# Patient Record
Sex: Female | Born: 1950
Health system: Southern US, Community
[De-identification: ages and names within clinical notes are randomized; demographics above are authoritative.]

## PROBLEM LIST (undated history)

## (undated) DIAGNOSIS — M1990 Unspecified osteoarthritis, unspecified site: Secondary | ICD-10-CM

## (undated) DIAGNOSIS — H269 Unspecified cataract: Secondary | ICD-10-CM

## (undated) DIAGNOSIS — C569 Malignant neoplasm of unspecified ovary: Secondary | ICD-10-CM

## (undated) DIAGNOSIS — N189 Chronic kidney disease, unspecified: Secondary | ICD-10-CM

## (undated) DIAGNOSIS — D649 Anemia, unspecified: Secondary | ICD-10-CM

## (undated) DIAGNOSIS — B019 Varicella without complication: Secondary | ICD-10-CM

## (undated) DIAGNOSIS — F32A Depression, unspecified: Secondary | ICD-10-CM

## (undated) DIAGNOSIS — K649 Unspecified hemorrhoids: Secondary | ICD-10-CM

## (undated) DIAGNOSIS — K635 Polyp of colon: Secondary | ICD-10-CM

## (undated) DIAGNOSIS — J45909 Unspecified asthma, uncomplicated: Secondary | ICD-10-CM

## (undated) DIAGNOSIS — F329 Major depressive disorder, single episode, unspecified: Secondary | ICD-10-CM

## (undated) HISTORY — DX: Chronic kidney disease, unspecified: N18.9

## (undated) HISTORY — DX: Polyp of colon: K63.5

## (undated) HISTORY — PX: BREAST CYST ASPIRATION: SHX578

## (undated) HISTORY — DX: Unspecified osteoarthritis, unspecified site: M19.90

## (undated) HISTORY — DX: Unspecified asthma, uncomplicated: J45.909

## (undated) HISTORY — DX: Depression, unspecified: F32.A

## (undated) HISTORY — DX: Varicella without complication: B01.9

## (undated) HISTORY — DX: Unspecified hemorrhoids: K64.9

## (undated) HISTORY — PX: TONSILLECTOMY: SUR1361

## (undated) HISTORY — DX: Unspecified cataract: H26.9

## (undated) HISTORY — DX: Malignant neoplasm of unspecified ovary: C56.9

## (undated) HISTORY — DX: Anemia, unspecified: D64.9

## (undated) HISTORY — PX: REFRACTIVE SURGERY: SHX103

## (undated) HISTORY — DX: Major depressive disorder, single episode, unspecified: F32.9

---

## 2004-04-19 ENCOUNTER — Ambulatory Visit: Payer: Self-pay | Admitting: Unknown Physician Specialty

## 2005-04-30 HISTORY — PX: BREAST SURGERY: SHX581

## 2005-06-05 ENCOUNTER — Ambulatory Visit: Payer: Self-pay | Admitting: Unknown Physician Specialty

## 2005-06-22 ENCOUNTER — Ambulatory Visit: Payer: Self-pay | Admitting: Unknown Physician Specialty

## 2006-06-12 ENCOUNTER — Ambulatory Visit: Payer: Self-pay | Admitting: Unknown Physician Specialty

## 2006-09-27 ENCOUNTER — Ambulatory Visit: Payer: Self-pay | Admitting: Unknown Physician Specialty

## 2007-03-19 ENCOUNTER — Ambulatory Visit: Payer: Self-pay | Admitting: Unknown Physician Specialty

## 2007-06-18 ENCOUNTER — Ambulatory Visit: Payer: Self-pay | Admitting: Unknown Physician Specialty

## 2008-04-30 HISTORY — PX: CARPAL TUNNEL RELEASE: SHX101

## 2008-06-23 ENCOUNTER — Ambulatory Visit: Payer: Self-pay | Admitting: Unknown Physician Specialty

## 2008-06-25 ENCOUNTER — Ambulatory Visit: Payer: Self-pay | Admitting: Internal Medicine

## 2009-06-27 ENCOUNTER — Ambulatory Visit: Payer: Self-pay | Admitting: Unknown Physician Specialty

## 2010-04-30 DIAGNOSIS — K635 Polyp of colon: Secondary | ICD-10-CM

## 2010-04-30 HISTORY — DX: Polyp of colon: K63.5

## 2010-06-28 ENCOUNTER — Ambulatory Visit: Payer: Self-pay | Admitting: Unknown Physician Specialty

## 2011-06-29 ENCOUNTER — Ambulatory Visit: Payer: Self-pay | Admitting: Unknown Physician Specialty

## 2013-07-06 ENCOUNTER — Ambulatory Visit: Payer: Self-pay | Admitting: General Practice

## 2015-03-08 LAB — CBC AND DIFFERENTIAL
HCT: 43 % (ref 36–46)
HEMOGLOBIN: 13.8 g/dL (ref 12.0–16.0)
NEUTROS ABS: 4 /uL
PLATELETS: 256 10*3/uL (ref 150–399)
WBC: 6.1 10^3/mL

## 2015-03-08 LAB — LIPID PANEL
CHOLESTEROL: 233 mg/dL — AB (ref 0–200)
HDL: 102 mg/dL — AB (ref 35–70)
LDL Cholesterol: 121 mg/dL
TRIGLYCERIDES: 52 mg/dL (ref 40–160)

## 2015-03-08 LAB — BASIC METABOLIC PANEL
BUN: 27 mg/dL — AB (ref 4–21)
CREATININE: 1.2 mg/dL — AB (ref 0.5–1.1)
Glucose: 93 mg/dL
Potassium: 4.7 mmol/L (ref 3.4–5.3)
SODIUM: 139 mmol/L (ref 137–147)

## 2015-03-08 LAB — HEPATIC FUNCTION PANEL
ALK PHOS: 85 U/L (ref 25–125)
ALT: 14 U/L (ref 7–35)
AST: 21 U/L (ref 13–35)
BILIRUBIN, TOTAL: 0.4 mg/dL

## 2015-03-08 LAB — TSH: TSH: 5.4 u[IU]/mL (ref 0.41–5.90)

## 2015-11-14 DIAGNOSIS — H43811 Vitreous degeneration, right eye: Secondary | ICD-10-CM | POA: Diagnosis not present

## 2015-11-23 ENCOUNTER — Encounter: Payer: Self-pay | Admitting: Family Medicine

## 2015-11-23 ENCOUNTER — Ambulatory Visit (INDEPENDENT_AMBULATORY_CARE_PROVIDER_SITE_OTHER): Payer: Medicare Other | Admitting: Family Medicine

## 2015-11-23 VITALS — BP 132/82 | HR 84 | Temp 97.9°F | Ht 62.0 in | Wt 168.0 lb

## 2015-11-23 DIAGNOSIS — Z0000 Encounter for general adult medical examination without abnormal findings: Secondary | ICD-10-CM

## 2015-11-23 DIAGNOSIS — F329 Major depressive disorder, single episode, unspecified: Secondary | ICD-10-CM

## 2015-11-23 DIAGNOSIS — Z23 Encounter for immunization: Secondary | ICD-10-CM | POA: Diagnosis not present

## 2015-11-23 DIAGNOSIS — E785 Hyperlipidemia, unspecified: Secondary | ICD-10-CM | POA: Diagnosis not present

## 2015-11-23 DIAGNOSIS — R7989 Other specified abnormal findings of blood chemistry: Secondary | ICD-10-CM

## 2015-11-23 DIAGNOSIS — N183 Chronic kidney disease, stage 3 unspecified: Secondary | ICD-10-CM | POA: Insufficient documentation

## 2015-11-23 DIAGNOSIS — F32A Depression, unspecified: Secondary | ICD-10-CM | POA: Insufficient documentation

## 2015-11-23 DIAGNOSIS — R748 Abnormal levels of other serum enzymes: Secondary | ICD-10-CM

## 2015-11-23 MED ORDER — CITALOPRAM HYDROBROMIDE 20 MG PO TABS: 10.0000 mg | ORAL_TABLET | ORAL | 3 refills | Status: DC

## 2015-11-23 NOTE — Patient Instructions (Addendum)
Follow up annually.  Health Maintenance, Female Adopting a healthy lifestyle and getting preventive care can go a long way to promote health and wellness. Talk with your health care provider about what schedule of regular examinations is right for you. This is a good chance for you to check in with your provider about disease prevention and staying healthy. In between checkups, there are plenty of things you can do on your own. Experts have done a lot of research about which lifestyle changes and preventive measures are most likely to keep you healthy. Ask your health care provider for more information. WEIGHT AND DIET  Eat a healthy diet  Be sure to include plenty of vegetables, fruits, low-fat dairy products, and lean protein.  Do not eat a lot of foods high in solid fats, added sugars, or salt.  Get regular exercise. This is one of the most important things you can do for your health.  Most adults should exercise for at least 150 minutes each week. The exercise should increase your heart rate and make you sweat (moderate-intensity exercise).  Most adults should also do strengthening exercises at least twice a week. This is in addition to the moderate-intensity exercise.  Maintain a healthy weight  Body mass index (BMI) is a measurement that can be used to identify possible weight problems. It estimates body fat based on height and weight. Your health care provider can help determine your BMI and help you achieve or maintain a healthy weight.  For females 20 years of age and older:   A BMI below 18.5 is considered underweight.  A BMI of 18.5 to 24.9 is normal.  A BMI of 25 to 29.9 is considered overweight.  A BMI of 30 and above is considered obese.  Watch levels of cholesterol and blood lipids  You should start having your blood tested for lipids and cholesterol at 65 years of age, then have this test every 5 years.  You may need to have your cholesterol levels checked more  often if:  Your lipid or cholesterol levels are high.  You are older than 65 years of age.  You are at high risk for heart disease.  CANCER SCREENING   Lung Cancer  Lung cancer screening is recommended for adults 55-80 years old who are at high risk for lung cancer because of a history of smoking.  A yearly low-dose CT scan of the lungs is recommended for people who:  Currently smoke.  Have quit within the past 15 years.  Have at least a 30-pack-year history of smoking. A pack year is smoking an average of one pack of cigarettes a day for 1 year.  Yearly screening should continue until it has been 15 years since you quit.  Yearly screening should stop if you develop a health problem that would prevent you from having lung cancer treatment.  Breast Cancer  Practice breast self-awareness. This means understanding how your breasts normally appear and feel.  It also means doing regular breast self-exams. Let your health care provider know about any changes, no matter how small.  If you are in your 20s or 30s, you should have a clinical breast exam (CBE) by a health care provider every 1-3 years as part of a regular health exam.  If you are 40 or older, have a CBE every year. Also consider having a breast X-ray (mammogram) every year.  If you have a family history of breast cancer, talk to your health care provider about genetic   screening.  If you are at high risk for breast cancer, talk to your health care provider about having an MRI and a mammogram every year.  Breast cancer gene (BRCA) assessment is recommended for women who have family members with BRCA-related cancers. BRCA-related cancers include:  Breast.  Ovarian.  Tubal.  Peritoneal cancers.  Results of the assessment will determine the need for genetic counseling and BRCA1 and BRCA2 testing. Cervical Cancer Your health care provider may recommend that you be screened regularly for cancer of the pelvic organs  (ovaries, uterus, and vagina). This screening involves a pelvic examination, including checking for microscopic changes to the surface of your cervix (Pap test). You may be encouraged to have this screening done every 3 years, beginning at age 21.  For women ages 30-65, health care providers may recommend pelvic exams and Pap testing every 3 years, or they may recommend the Pap and pelvic exam, combined with testing for human papilloma virus (HPV), every 5 years. Some types of HPV increase your risk of cervical cancer. Testing for HPV may also be done on women of any age with unclear Pap test results.  Other health care providers may not recommend any screening for nonpregnant women who are considered low risk for pelvic cancer and who do not have symptoms. Ask your health care provider if a screening pelvic exam is right for you.  If you have had past treatment for cervical cancer or a condition that could lead to cancer, you need Pap tests and screening for cancer for at least 20 years after your treatment. If Pap tests have been discontinued, your risk factors (such as having a new sexual partner) need to be reassessed to determine if screening should resume. Some women have medical problems that increase the chance of getting cervical cancer. In these cases, your health care provider may recommend more frequent screening and Pap tests. Colorectal Cancer  This type of cancer can be detected and often prevented.  Routine colorectal cancer screening usually begins at 65 years of age and continues through 65 years of age.  Your health care provider may recommend screening at an earlier age if you have risk factors for colon cancer.  Your health care provider may also recommend using home test kits to check for hidden blood in the stool.  A small camera at the end of a tube can be used to examine your colon directly (sigmoidoscopy or colonoscopy). This is done to check for the earliest forms of  colorectal cancer.  Routine screening usually begins at age 50.  Direct examination of the colon should be repeated every 5-10 years through 65 years of age. However, you may need to be screened more often if early forms of precancerous polyps or small growths are found. Skin Cancer  Check your skin from head to toe regularly.  Tell your health care provider about any new moles or changes in moles, especially if there is a change in a mole's shape or color.  Also tell your health care provider if you have a mole that is larger than the size of a pencil eraser.  Always use sunscreen. Apply sunscreen liberally and repeatedly throughout the day.  Protect yourself by wearing long sleeves, pants, a wide-brimmed hat, and sunglasses whenever you are outside. HEART DISEASE, DIABETES, AND HIGH BLOOD PRESSURE   High blood pressure causes heart disease and increases the risk of stroke. High blood pressure is more likely to develop in:  People who have blood pressure   in the high end of the normal range (130-139/85-89 mm Hg).  People who are overweight or obese.  People who are African American.  If you are 13-33 years of age, have your blood pressure checked every 3-5 years. If you are 45 years of age or older, have your blood pressure checked every year. You should have your blood pressure measured twice--once when you are at a hospital or clinic, and once when you are not at a hospital or clinic. Record the average of the two measurements. To check your blood pressure when you are not at a hospital or clinic, you can use:  An automated blood pressure machine at a pharmacy.  A home blood pressure monitor.  If you are between 23 years and 90 years old, ask your health care provider if you should take aspirin to prevent strokes.  Have regular diabetes screenings. This involves taking a blood sample to check your fasting blood sugar level.  If you are at a normal weight and have a low risk for  diabetes, have this test once every three years after 65 years of age.  If you are overweight and have a high risk for diabetes, consider being tested at a younger age or more often. PREVENTING INFECTION  Hepatitis B  If you have a higher risk for hepatitis B, you should be screened for this virus. You are considered at high risk for hepatitis B if:  You were born in a country where hepatitis B is common. Ask your health care provider which countries are considered high risk.  Your parents were born in a high-risk country, and you have not been immunized against hepatitis B (hepatitis B vaccine).  You have HIV or AIDS.  You use needles to inject street drugs.  You live with someone who has hepatitis B.  You have had sex with someone who has hepatitis B.  You get hemodialysis treatment.  You take certain medicines for conditions, including cancer, organ transplantation, and autoimmune conditions. Hepatitis C  Blood testing is recommended for:  Everyone born from [birth year] through 1965.  Anyone with known risk factors for hepatitis C. Sexually transmitted infections (STIs)  You should be screened for sexually transmitted infections (STIs) including gonorrhea and chlamydia if:  You are sexually active and are younger than 65 years of age.  You are older than 65 years of age and your health care provider tells you that you are at risk for this type of infection.  Your sexual activity has changed since you were last screened and you are at an increased risk for chlamydia or gonorrhea. Ask your health care provider if you are at risk.  If you do not have HIV, but are at risk, it may be recommended that you take a prescription medicine daily to prevent HIV infection. This is called pre-exposure prophylaxis (PrEP). You are considered at risk if:  You are sexually active and do not regularly use condoms or know the HIV status of your partner(s).  You take drugs by injection.  You are  sexually active with a partner who has HIV. Talk with your health care provider about whether you are at high risk of being infected with HIV. If you choose to begin PrEP, you should first be tested for HIV. You should then be tested every 3 months for as long as you are taking PrEP.  PREGNANCY   If you are premenopausal and you may become pregnant, ask your health care provider about preconception counseling.  If you may become pregnant, take 400 to 800 micrograms (mcg) of folic acid every day.  If you want to prevent pregnancy, talk to your health care provider about birth control (contraception). OSTEOPOROSIS AND MENOPAUSE   Osteoporosis is a disease in which the bones lose minerals and strength with aging. This can result in serious bone fractures. Your risk for osteoporosis can be identified using a bone density scan.  If you are 67 years of age or older, or if you are at risk for osteoporosis and fractures, ask your health care provider if you should be screened.  Ask your health care provider whether you should take a calcium or vitamin D supplement to lower your risk for osteoporosis.  Menopause may have certain physical symptoms and risks.  Hormone replacement therapy may reduce some of these symptoms and risks. Talk to your health care provider about whether hormone replacement therapy is right for you.  HOME CARE INSTRUCTIONS   Schedule regular health, dental, and eye exams.  Stay current with your immunizations.   Do not use any tobacco products including cigarettes, chewing tobacco, or electronic cigarettes.  If you are pregnant, do not drink alcohol.  If you are breastfeeding, limit how much and how often you drink alcohol.  Limit alcohol intake to no more than 1 drink per day for nonpregnant women. One drink equals 12 ounces of beer, 5 ounces of wine, or 1 ounces of hard liquor.  Do not use street drugs.  Do not share needles.  Ask your health care provider for  help if you need support or information about quitting drugs.  Tell your health care provider if you often feel depressed.  Tell your health care provider if you have ever been abused or do not feel safe at home.   This information is not intended to replace advice given to you by your health care provider. Make sure you discuss any questions you have with your health care provider.   Document Released: 10/30/2010 Document Revised: 05/07/2014 Document Reviewed: 03/18/2013 Elsevier Interactive Patient Education [employer].

## 2015-11-23 NOTE — Progress Notes (Signed)
Pre visit review using our clinic review tool, if applicable. No additional management support is needed unless otherwise documented below in the visit note. 

## 2015-11-23 NOTE — Progress Notes (Signed)
Subjective:    Beth Hart is a 65 y.o. female who presents to establish care. This is her welcome to Medicare visit.  Current issues/concerns:  Depression  Stable on Celexa.  HLD  ASCVD risk score 5% (perhaps slightly less as her HDL exceeded the risk calculator limit).  Not on treatment at this time.  Elevated creatinine  Has been stable.  Attributed to NSAID use.  Objective:    Today's Vitals   11/23/15 0823  BP: 132/82  Pulse: 84  Temp: 97.9 F (36.6 C)  TempSrc: Oral  SpO2: 95%  Weight: 168 lb (76.2 kg)  Height: 5\' 2"  (1.575 m)  Body mass index is 30.73 kg/m.  Medications Outpatient Encounter Prescriptions as of 11/23/2015  Medication Sig  . CALCIUM-VITAMINS C & D PO Take by mouth.  . citalopram (CELEXA) 20 MG tablet Take 0.5 tablets (10 mg total) by mouth See admin instructions.  . Multiple Vitamin (MULTIVITAMIN) tablet Take 1 tablet by mouth daily.  . [DISCONTINUED] citalopram (CELEXA) 20 MG tablet Take 10 mg by mouth See admin instructions.   No facility-administered encounter medications on file as of 11/23/2015.     History: Past Medical History:  Diagnosis Date  . Arthritis   . Chicken pox   . Colon polyps 2012  . Depression    Past Surgical History:  Procedure Laterality Date  . BREAST SURGERY  2007  . CARPAL TUNNEL RELEASE  2010  . TONSILLECTOMY      Family History  Problem Relation Age of Onset  . Alzheimer's disease Mother   . Lung cancer Father    Social History   Occupational History  . Not on file.   Social History Main Topics  . Smoking status: Never Smoker  . Smokeless tobacco: Never Used  . Alcohol use No  . Drug use: No  . Sexual activity: Not on file   Tobacco Counseling: Nonsmoker.  Immunizations and Health Maintenance Immunization History  Administered Date(s) Administered  . Pneumococcal Polysaccharide-23 11/23/2015  . Tdap 05/28/2014   Health Maintenance Due  Topic Date Due  . PNA vac Low Risk  Adult (1 of 2 - PCV13) 11/13/2015   Activities of Daily Living - Fully independent.  Physical Exam Constitutional: She is oriented to person, place, and time. She appears well-developed and well-nourished. No distress.  HENT:  Head: Normocephalic and atraumatic.  Nose: Nose normal.  Mouth/Throat: Oropharynx is clear and moist. No oropharyngeal exudate.  Normal TM's bilaterally.   Eyes: Conjunctivae are normal. No scleral icterus.  Neck: Neck supple. No thyromegaly present.  Cardiovascular: Normal rate and regular rhythm.   No murmur heard. Pulmonary/Chest: Effort normal and breath sounds normal. She has no wheezes. She has no rales.  Abdominal: Soft. She exhibits no distension. There is no tenderness. There is no rebound and no guarding.  Musculoskeletal: Normal range of motion. She exhibits no edema.  Lymphadenopathy:    She has no cervical adenopathy.  Neurological: She is alert and oriented to person, place, and time.  Skin: Skin is warm and dry. No rash noted.  Psychiatric: She has a normal mood and affect.  Vitals reviewed.   Assessment:    This is a routine wellness examination for this patient.  Vision/Hearing screen Not needed. No concerns. Followed by Ophthalmology.   Depression Screen PHQ 2/9 Scores 11/23/2015  PHQ - 2 Score 0    Fall Risk Fall Risk  11/23/2015  Falls in the past year? No   MMSE: Not performed  as patient is fully independent and has not issues with memory/cognition. She is able to perform advanced tasks/skills.  Patient Care Team: Coral Spikes, DO as PCP - General (Family Medicine)     Plan:    During the course of the visit the patient was educated and counseled about the following appropriate screening and preventive services:   Vaccines to include Pneumoccal, Influenza, Hepatitis B, Td, Zostavax, HCV  Cardiovascular Disease  Colorectal cancer screening  Bone density screening  Diabetes screening  Mammography/PAP  Nutrition  counseling Her current medications and allergies were reviewed and needed refills of her chronic medications were ordered. The plan for yearly health maintenance was discussed and all orders and referrals were made as appropriate.  Patient Instructions (the written plan) was given to the patient.   Coral Spikes, DO 11/23/2015

## 2015-12-05 ENCOUNTER — Encounter: Payer: Self-pay | Admitting: Family Medicine

## 2015-12-20 DIAGNOSIS — H43811 Vitreous degeneration, right eye: Secondary | ICD-10-CM | POA: Diagnosis not present

## 2016-02-23 DIAGNOSIS — Z23 Encounter for immunization: Secondary | ICD-10-CM | POA: Diagnosis not present

## 2016-03-19 ENCOUNTER — Other Ambulatory Visit: Payer: Self-pay | Admitting: Family Medicine

## 2016-03-19 ENCOUNTER — Telehealth: Payer: Self-pay

## 2016-03-19 ENCOUNTER — Encounter: Payer: Self-pay | Admitting: Family Medicine

## 2016-03-19 DIAGNOSIS — E669 Obesity, unspecified: Secondary | ICD-10-CM

## 2016-03-19 DIAGNOSIS — E785 Hyperlipidemia, unspecified: Secondary | ICD-10-CM

## 2016-03-19 DIAGNOSIS — Z862 Personal history of diseases of the blood and blood-forming organs and certain disorders involving the immune mechanism: Secondary | ICD-10-CM

## 2016-03-19 DIAGNOSIS — Z130 Encounter for screening for diseases of the blood and blood-forming organs and certain disorders involving the immune mechanism: Secondary | ICD-10-CM

## 2016-03-19 DIAGNOSIS — R7989 Other specified abnormal findings of blood chemistry: Secondary | ICD-10-CM

## 2016-03-19 NOTE — Telephone Encounter (Signed)
Pt coming for fasting labs 03/20/16. Please place future orders. Thank you.

## 2016-03-20 ENCOUNTER — Other Ambulatory Visit (INDEPENDENT_AMBULATORY_CARE_PROVIDER_SITE_OTHER): Payer: Medicare Other

## 2016-03-20 DIAGNOSIS — Z862 Personal history of diseases of the blood and blood-forming organs and certain disorders involving the immune mechanism: Secondary | ICD-10-CM | POA: Diagnosis not present

## 2016-03-20 DIAGNOSIS — R946 Abnormal results of thyroid function studies: Secondary | ICD-10-CM | POA: Diagnosis not present

## 2016-03-20 DIAGNOSIS — E785 Hyperlipidemia, unspecified: Secondary | ICD-10-CM | POA: Diagnosis not present

## 2016-03-20 DIAGNOSIS — E669 Obesity, unspecified: Secondary | ICD-10-CM

## 2016-03-20 DIAGNOSIS — R7989 Other specified abnormal findings of blood chemistry: Secondary | ICD-10-CM

## 2016-03-20 LAB — COMPREHENSIVE METABOLIC PANEL
ALBUMIN: 4.1 g/dL (ref 3.5–5.2)
ALT: 17 U/L (ref 0–35)
AST: 18 U/L (ref 0–37)
Alkaline Phosphatase: 92 U/L (ref 39–117)
BILIRUBIN TOTAL: 0.4 mg/dL (ref 0.2–1.2)
BUN: 35 mg/dL — AB (ref 6–23)
CALCIUM: 9.6 mg/dL (ref 8.4–10.5)
CO2: 29 meq/L (ref 19–32)
CREATININE: 1.34 mg/dL — AB (ref 0.40–1.20)
Chloride: 103 mEq/L (ref 96–112)
GFR: 42.14 mL/min — ABNORMAL LOW (ref >60.00)
Glucose, Bld: 86 mg/dL (ref 70–99)
Potassium: 4.4 mEq/L (ref 3.5–5.1)
SODIUM: 139 meq/L (ref 135–145)
Total Protein: 6.6 g/dL (ref 6.0–8.3)

## 2016-03-20 LAB — CBC
HEMATOCRIT: 40.1 % (ref 36.0–46.0)
Hemoglobin: 13.5 g/dL (ref 12.0–15.0)
MCHC: 33.6 g/dL (ref 30.0–36.0)
MCV: 88.8 fl (ref 78.0–100.0)
Platelets: 214 10*3/uL (ref 150.0–400.0)
RBC: 4.52 Mil/uL (ref 3.87–5.11)
RDW: 13.9 % (ref 11.5–15.5)
WBC: 7.4 10*3/uL (ref 4.0–10.5)

## 2016-03-20 LAB — LIPID PANEL
CHOL/HDL RATIO: 3
CHOLESTEROL: 230 mg/dL — AB (ref 0–200)
HDL: 79.2 mg/dL (ref >39.00)
LDL CALC: 137 mg/dL — AB (ref 0–99)
NonHDL: 150.52
TRIGLYCERIDES: 68 mg/dL (ref 0.0–149.0)
VLDL: 13.6 mg/dL (ref 0.0–40.0)

## 2016-03-20 LAB — TSH: TSH: 2.38 u[IU]/mL (ref 0.35–4.50)

## 2016-08-03 DIAGNOSIS — M25561 Pain in right knee: Secondary | ICD-10-CM | POA: Diagnosis not present

## 2016-10-03 ENCOUNTER — Encounter: Payer: Self-pay | Admitting: Advanced Practice Midwife

## 2016-10-03 ENCOUNTER — Ambulatory Visit (INDEPENDENT_AMBULATORY_CARE_PROVIDER_SITE_OTHER): Payer: Medicare Other | Admitting: Advanced Practice Midwife

## 2016-10-03 VITALS — BP 126/78 | Ht 62.0 in | Wt 160.0 lb

## 2016-10-03 DIAGNOSIS — Z01419 Encounter for gynecological examination (general) (routine) without abnormal findings: Secondary | ICD-10-CM

## 2016-10-03 DIAGNOSIS — Z124 Encounter for screening for malignant neoplasm of cervix: Secondary | ICD-10-CM | POA: Diagnosis not present

## 2016-10-03 NOTE — Progress Notes (Signed)
Patient ID: ALANY BORMAN, female   DOB: [birth year], 66 y.o.   MRN: [id number]     Gynecology Annual Exam  PCP: Coral Spikes, DO  Chief Complaint:  Chief Complaint  Patient presents with  . Annual Exam    History of Present Illness:Patient is a 66 y.o. G1P1001 presents for annual exam. The patient has no complaints today.   LMP: No LMP recorded. Patient is postmenopausal.   The patient is sexually active. She admits to dyspareunia but declines hormone replacement. She uses lubrication which helps.  The patient does perform self breast exams.  There is no notable family history of breast or ovarian cancer in her family.  The patient wears seatbelts: yes.  The patient has regular exercise: yes.  She admits healthy diet.  The patient denies current symptoms of depression.     Review of Systems: Review of Systems  Constitutional: Negative.   HENT: Negative.   Eyes: Negative.   Respiratory: Negative.   Cardiovascular: Negative.   Gastrointestinal: Negative.   Genitourinary: Negative.   Musculoskeletal: Negative.   Skin: Negative.   Neurological: Negative.   Endo/Heme/Allergies: Negative.   Psychiatric/Behavioral: Negative.     Past Medical History:  Past Medical History:  Diagnosis Date  . Anemia   . Arthritis   . Asthma    as a child  . Chicken pox   . Colon polyps 2012  . Depression   . Hemorrhoids     Past Surgical History:  Past Surgical History:  Procedure Laterality Date  . BREAST SURGERY  2007  . CARPAL TUNNEL RELEASE  2010  . REFRACTIVE SURGERY    . TONSILLECTOMY      Gynecologic History:  No LMP recorded. Patient is postmenopausal. Last Pap: 1.5 years ago. Results were: normal  Last mammogram: 1.5 years ago. Results were: normal Obstetric History: G1P1001  Family History:  Family History  Problem Relation Age of Onset  . Alzheimer's disease Mother   . Lung cancer Father     Social History:  Social History   Social History  .  Marital status: Married    Spouse name: N/A  . Number of children: N/A  . Years of education: N/A   Occupational History  . Not on file.   Social History Main Topics  . Smoking status: Never Smoker  . Smokeless tobacco: Never Used  . Alcohol use No  . Drug use: No  . Sexual activity: Yes   Other Topics Concern  . Not on file   Social History Narrative  . No narrative on file    Allergies:  No Known Allergies  Medications: Prior to Admission medications   Medication Sig Start Date End Date Taking? Authorizing Provider  CALCIUM-VITAMINS C & D PO Take by mouth.   Yes [provider]  citalopram (CELEXA) 20 MG tablet Take 0.5 tablets (10 mg total) by mouth See admin instructions. 11/23/15  Yes Coral Spikes, DO  Multiple Vitamin (MULTIVITAMIN) tablet Take 1 tablet by mouth daily.   Yes [provider]    Physical Exam Vitals: Blood pressure 126/78, height 5\' 2"  (1.575 m), weight 160 lb (72.6 kg).  General: NAD HEENT: normocephalic, anicteric Thyroid: no enlargement, no palpable nodules Pulmonary: No increased work of breathing, CTAB Cardiovascular: RRR, distal pulses 2+ Breast: Breast symmetrical, no tenderness, no palpable nodules or masses, no skin or nipple retraction present, no nipple discharge.  No axillary or supraclavicular lymphadenopathy. Abdomen: NABS, soft, non-tender, non-distended.  Umbilicus without  lesions.  No hepatomegaly, splenomegaly or masses palpable. No evidence of hernia  Genitourinary:  External: Normal external female genitalia.  Normal urethral meatus, normal  Bartholin's and Skene's glands.    Vagina: Normal vaginal mucosa, no evidence of prolapse.    Cervix: Grossly normal in appearance, no bleeding, no CMT  Uterus: Non-enlarged, mobile, normal contour.    Adnexa: ovaries non-enlarged, no adnexal masses  Rectal: deferred  Lymphatic: no evidence of inguinal lymphadenopathy Extremities: no edema, erythema, or  tenderness Neurologic: Grossly intact Psychiatric: mood appropriate, affect full    Assessment: 66 y.o. G1P1001 Well woman exam with PAP  Plan: Problem List Items Addressed This Visit    None    Visit Diagnoses    Well woman exam with routine gynecological exam    -  Primary   Relevant Orders   Pap IG (Image Guided)   Cervical cancer screening       Relevant Orders   Pap IG (Image Guided)      1) Mammogram - recommend yearly screening mammogram.  Mammogram patient to schedule in [place]  2) Continue healthy lifestyle diet and exercise  3) ASCCP guidelines and rationale discussed.  Patient opts for every 5 years screening interval following today's visit  4) Osteoporosis  - per USPTF routine screening DEXA at age 66 Had a scan years ago and declines screening at this time  5) Routine healthcare maintenance including cholesterol, diabetes screening discussed managed by PCP  6) Colonoscopy: referral managed by PCP  7) Follow up 1 year for routine annual   Rod Can, [place]

## 2016-10-04 ENCOUNTER — Other Ambulatory Visit: Payer: Self-pay | Admitting: Family Medicine

## 2016-10-04 DIAGNOSIS — Z1231 Encounter for screening mammogram for malignant neoplasm of breast: Secondary | ICD-10-CM

## 2016-10-04 DIAGNOSIS — Z01419 Encounter for gynecological examination (general) (routine) without abnormal findings: Secondary | ICD-10-CM | POA: Diagnosis not present

## 2016-10-04 DIAGNOSIS — Z124 Encounter for screening for malignant neoplasm of cervix: Secondary | ICD-10-CM | POA: Diagnosis not present

## 2016-10-08 LAB — PAP IG (IMAGE GUIDED): PAP Smear Comment: 0

## 2016-10-11 ENCOUNTER — Ambulatory Visit
Admission: RE | Admit: 2016-10-11 | Discharge: 2016-10-11 | Disposition: A | Discharge status: Home / Self Care | Payer: Medicare Other | Source: Ambulatory Visit | Attending: Family Medicine | Admitting: Family Medicine

## 2016-10-11 DIAGNOSIS — Z1231 Encounter for screening mammogram for malignant neoplasm of breast: Secondary | ICD-10-CM | POA: Diagnosis not present

## 2016-10-16 ENCOUNTER — Inpatient Hospital Stay
Admission: RE | Admit: 2016-10-16 | Discharge: 2016-10-16 | Disposition: A | Discharge status: Home / Self Care | Payer: Self-pay | Source: Ambulatory Visit | Attending: *Deleted | Admitting: *Deleted

## 2016-10-16 ENCOUNTER — Other Ambulatory Visit: Payer: Self-pay | Admitting: *Deleted

## 2016-10-16 DIAGNOSIS — Z9289 Personal history of other medical treatment: Secondary | ICD-10-CM

## 2016-11-14 DIAGNOSIS — M25561 Pain in right knee: Secondary | ICD-10-CM | POA: Diagnosis not present

## 2016-11-15 ENCOUNTER — Other Ambulatory Visit: Payer: Self-pay | Admitting: Student

## 2016-11-15 DIAGNOSIS — M25561 Pain in right knee: Principal | ICD-10-CM

## 2016-11-15 DIAGNOSIS — G8929 Other chronic pain: Secondary | ICD-10-CM

## 2016-11-27 ENCOUNTER — Ambulatory Visit
Admission: RE | Admit: 2016-11-27 | Discharge: 2016-11-27 | Disposition: A | Discharge status: Home / Self Care | Payer: Medicare Other | Source: Ambulatory Visit | Attending: Student | Admitting: Student

## 2016-11-27 DIAGNOSIS — M25561 Pain in right knee: Secondary | ICD-10-CM | POA: Insufficient documentation

## 2016-11-27 DIAGNOSIS — M1711 Unilateral primary osteoarthritis, right knee: Secondary | ICD-10-CM | POA: Diagnosis not present

## 2016-11-27 DIAGNOSIS — G8929 Other chronic pain: Secondary | ICD-10-CM | POA: Insufficient documentation

## 2016-11-27 DIAGNOSIS — I868 Varicose veins of other specified sites: Secondary | ICD-10-CM | POA: Diagnosis not present

## 2016-12-05 DIAGNOSIS — M25561 Pain in right knee: Secondary | ICD-10-CM | POA: Diagnosis not present

## 2016-12-05 DIAGNOSIS — M2241 Chondromalacia patellae, right knee: Secondary | ICD-10-CM | POA: Diagnosis not present

## 2017-02-07 ENCOUNTER — Ambulatory Visit (INDEPENDENT_AMBULATORY_CARE_PROVIDER_SITE_OTHER): Payer: Medicare Other | Admitting: Family Medicine

## 2017-02-07 ENCOUNTER — Encounter: Payer: Self-pay | Admitting: Family Medicine

## 2017-02-07 VITALS — BP 140/90 | HR 64 | Temp 97.9°F | Wt 162.2 lb

## 2017-02-07 DIAGNOSIS — Z23 Encounter for immunization: Secondary | ICD-10-CM | POA: Diagnosis not present

## 2017-02-07 DIAGNOSIS — F330 Major depressive disorder, recurrent, mild: Secondary | ICD-10-CM

## 2017-02-07 DIAGNOSIS — E785 Hyperlipidemia, unspecified: Secondary | ICD-10-CM

## 2017-02-07 MED ORDER — CITALOPRAM HYDROBROMIDE 20 MG PO TABS: 10.0000 mg | ORAL_TABLET | ORAL | 3 refills | Status: DC

## 2017-02-07 MED ORDER — PNEUMOCOCCAL 13-VAL CONJ VACC IM SUSP: 0.5000 mL | Freq: Once | INTRAMUSCULAR | 0 refills | Status: AC

## 2017-02-07 NOTE — Assessment & Plan Note (Signed)
Well-controlled on Celexa. We'll continue this. If symptoms worsen she'll let us know.

## 2017-02-07 NOTE — Assessment & Plan Note (Signed)
Management diet and exercise. Plan to check fasting cholesterol in November.

## 2017-02-07 NOTE — Patient Instructions (Signed)
Nice to see you. We will have you return sometime after 03/20/17 for fasting lab work. Please continue your Celexa.

## 2017-02-07 NOTE — Progress Notes (Signed)
  Tommi Rumps, MD Phone: [phone]  Beth Hart is a 66 y.o. female who presents today for follow-up.  Depression: She's been on Celexa 10 mg daily for some time. She gets more depressed during the winter months. She feels fine during the summer. She tries to wean herself off of Celexa at times and starts to have more depression symptoms. She notes no SI or HI.  Hyperlipidemia: She walks for exercise. She's cut out carbs and sugars. Mostly vegetables and protein. No chest pain.  Elevated BP: No history of elevation. Reports she checks at home and it is typically in the 120s over 80s. No chest pain.  ROS see history of present illness  Objective  Physical Exam Vitals:   02/07/17 1056  BP: 140/90  Pulse: 64  Temp: 97.9 F (36.6 C)  SpO2: 98%    BP Readings from Last 3 Encounters:  02/07/17 140/90  10/03/16 126/78  11/23/15 132/82   Wt Readings from Last 3 Encounters:  02/07/17 162 lb 3.2 oz (73.6 kg)  10/03/16 160 lb (72.6 kg)  11/23/15 168 lb (76.2 kg)    Physical Exam  Constitutional: No distress.  Cardiovascular: Normal rate, regular rhythm and normal heart sounds.   Pulmonary/Chest: Effort normal and breath sounds normal.  Musculoskeletal: She exhibits no edema.  Neurological: She is alert. Gait normal.  Skin: Skin is warm and dry. She is not diaphoretic.     Assessment/Plan: Please see individual problem list.  Depression Well-controlled on Celexa. We'll continue this. If symptoms worsen she'll let us know.  Hyperlipidemia Management diet and exercise. Plan to check fasting cholesterol in November.  BP normal at home. She'll continue to monitor. She will let us know if her blood pressure is consistently 140/90 or greater.  Orders Placed This Encounter  Procedures  . Flu vaccine HIGH DOSE PF  . Comp Met (CMET)    Standing Status:   Future    Standing Expiration Date:   02/07/2018  . Lipid panel    Standing Status:   Future    Standing  Expiration Date:   02/07/2018    Meds ordered this encounter  Medications  . citalopram (CELEXA) 20 MG tablet    Sig: Take 0.5 tablets (10 mg total) by mouth See admin instructions.    Dispense:  90 tablet    Refill:  3  . pneumococcal 13-valent conjugate vaccine (PREVNAR 13) SUSP injection    Sig: Inject 0.5 mLs into the muscle once.    Dispense:  0.5 mL    Refill:  0    Tommi Rumps, MD [place]

## 2017-02-12 DIAGNOSIS — Z23 Encounter for immunization: Secondary | ICD-10-CM | POA: Diagnosis not present

## 2017-03-26 ENCOUNTER — Other Ambulatory Visit (INDEPENDENT_AMBULATORY_CARE_PROVIDER_SITE_OTHER): Payer: Medicare Other

## 2017-03-26 DIAGNOSIS — E785 Hyperlipidemia, unspecified: Secondary | ICD-10-CM

## 2017-03-26 LAB — COMPREHENSIVE METABOLIC PANEL
ALBUMIN: 4.1 g/dL (ref 3.5–5.2)
ALT: 18 U/L (ref 0–35)
AST: 22 U/L (ref 0–37)
Alkaline Phosphatase: 79 U/L (ref 39–117)
BUN: 25 mg/dL — ABNORMAL HIGH (ref 6–23)
CALCIUM: 9.5 mg/dL (ref 8.4–10.5)
CO2: 27 meq/L (ref 19–32)
CREATININE: 1.25 mg/dL — AB (ref 0.40–1.20)
Chloride: 105 mEq/L (ref 96–112)
GFR: 45.52 mL/min — ABNORMAL LOW (ref >60.00)
Glucose, Bld: 96 mg/dL (ref 70–99)
Potassium: 4.2 mEq/L (ref 3.5–5.1)
Sodium: 137 mEq/L (ref 135–145)
TOTAL PROTEIN: 6.9 g/dL (ref 6.0–8.3)
Total Bilirubin: 0.4 mg/dL (ref 0.2–1.2)

## 2017-03-26 LAB — LIPID PANEL
CHOLESTEROL: 198 mg/dL (ref 0–200)
HDL: 78.7 mg/dL (ref >39.00)
LDL Cholesterol: 109 mg/dL — ABNORMAL HIGH (ref 0–99)
NonHDL: 119.75
TRIGLYCERIDES: 56 mg/dL (ref 0.0–149.0)
Total CHOL/HDL Ratio: 3
VLDL: 11.2 mg/dL (ref 0.0–40.0)

## 2017-03-30 NOTE — Progress Notes (Signed)
The 10-year ASCVD risk score Mikey Bussing DC Brooke Bonito., et al., 2013) is: 6.4%   Values used to calculate the score:     Age: 66 years     Sex: Female     Is Non-Hispanic African American: No     Diabetic: No     Tobacco smoker: No     Systolic Blood Pressure: 998 mmHg     Is BP treated: No     HDL Cholesterol: 78.7 mg/dL     Total Cholesterol: 198 mg/dL

## 2017-04-02 ENCOUNTER — Other Ambulatory Visit: Payer: Self-pay | Admitting: Family Medicine

## 2017-04-02 DIAGNOSIS — N183 Chronic kidney disease, stage 3 unspecified: Secondary | ICD-10-CM

## 2017-04-04 DIAGNOSIS — K648 Other hemorrhoids: Secondary | ICD-10-CM | POA: Diagnosis not present

## 2017-04-04 DIAGNOSIS — K640 First degree hemorrhoids: Secondary | ICD-10-CM | POA: Diagnosis not present

## 2017-04-04 DIAGNOSIS — Z86010 Personal history of colonic polyps: Secondary | ICD-10-CM | POA: Diagnosis not present

## 2017-04-10 ENCOUNTER — Ambulatory Visit: Payer: Medicare Other

## 2017-04-11 ENCOUNTER — Other Ambulatory Visit: Payer: Medicare Other

## 2017-04-16 ENCOUNTER — Other Ambulatory Visit: Payer: Medicare Other

## 2017-04-16 DIAGNOSIS — N183 Chronic kidney disease, stage 3 unspecified: Secondary | ICD-10-CM

## 2017-04-17 LAB — PROTEIN / CREATININE RATIO, URINE
CREATININE, URINE: 18 mg/dL — AB (ref 20–275)
PROTEIN/CREAT RATIO: 833 mg/g{creat} — AB (ref 21–161)
TOTAL PROTEIN, URINE: 15 mg/dL (ref 5–24)

## 2017-04-18 ENCOUNTER — Other Ambulatory Visit: Payer: Medicare Other

## 2017-04-25 ENCOUNTER — Ambulatory Visit
Admission: RE | Admit: 2017-04-25 | Discharge: 2017-04-25 | Disposition: A | Discharge status: Home / Self Care | Payer: Medicare Other | Source: Ambulatory Visit | Attending: Family Medicine | Admitting: Family Medicine

## 2017-04-25 DIAGNOSIS — R93422 Abnormal radiologic findings on diagnostic imaging of left kidney: Secondary | ICD-10-CM | POA: Insufficient documentation

## 2017-04-25 DIAGNOSIS — N281 Cyst of kidney, acquired: Secondary | ICD-10-CM | POA: Insufficient documentation

## 2017-04-25 DIAGNOSIS — N183 Chronic kidney disease, stage 3 unspecified: Secondary | ICD-10-CM

## 2017-04-25 DIAGNOSIS — N189 Chronic kidney disease, unspecified: Secondary | ICD-10-CM | POA: Diagnosis not present

## 2017-04-25 DIAGNOSIS — R93421 Abnormal radiologic findings on diagnostic imaging of right kidney: Secondary | ICD-10-CM | POA: Insufficient documentation

## 2017-04-26 ENCOUNTER — Other Ambulatory Visit: Payer: Self-pay | Admitting: Family Medicine

## 2017-04-26 DIAGNOSIS — N183 Chronic kidney disease, stage 3 unspecified: Secondary | ICD-10-CM

## 2017-05-30 DIAGNOSIS — R809 Proteinuria, unspecified: Secondary | ICD-10-CM | POA: Diagnosis not present

## 2017-05-30 DIAGNOSIS — N183 Chronic kidney disease, stage 3 (moderate): Secondary | ICD-10-CM | POA: Diagnosis not present

## 2017-06-11 ENCOUNTER — Telehealth: Payer: Self-pay | Admitting: Family Medicine

## 2017-06-11 MED ORDER — ZOSTER VAC RECOMB ADJUVANTED 50 MCG/0.5ML IM SUSR: 0.5000 mL | Freq: Once | INTRAMUSCULAR | 0 refills | Status: AC

## 2017-06-11 NOTE — Telephone Encounter (Signed)
Please advise 

## 2017-06-11 NOTE — Telephone Encounter (Signed)
Notified Dr.Sonnenberg that patient has not had the shingrix, she only had the zostavax 6 years ago.

## 2017-06-11 NOTE — Telephone Encounter (Signed)
This has been printed.  Please find out when and where she had the first shingles vaccine.  Thanks.

## 2017-06-11 NOTE — Telephone Encounter (Signed)
Copied from [place] [phone]. Topic: Quick Communication - See Telephone Encounter >> Jun 11, 2017  9:08 AM Vernona Rieger wrote: CRM for notification. See Telephone encounter for:   06/11/17.   Pt wants to know if she can have a script for a 2nd shringix shot, she would like to take that and get it somewhere else. Call back is [phone]

## 2017-06-11 NOTE — Telephone Encounter (Signed)
Patient states she has only had the zostavax not the shingrix. Notified rx is at front desk

## 2017-08-08 ENCOUNTER — Ambulatory Visit: Payer: Medicare Other | Admitting: Family Medicine

## 2017-08-08 DIAGNOSIS — Z0289 Encounter for other administrative examinations: Secondary | ICD-10-CM

## 2017-09-12 DIAGNOSIS — D224 Melanocytic nevi of scalp and neck: Secondary | ICD-10-CM | POA: Diagnosis not present

## 2017-09-12 DIAGNOSIS — L235 Allergic contact dermatitis due to other chemical products: Secondary | ICD-10-CM | POA: Diagnosis not present

## 2017-10-03 DIAGNOSIS — T07XXXA Unspecified multiple injuries, initial encounter: Secondary | ICD-10-CM | POA: Diagnosis not present

## 2017-10-03 DIAGNOSIS — R21 Rash and other nonspecific skin eruption: Secondary | ICD-10-CM | POA: Diagnosis not present

## 2017-10-03 DIAGNOSIS — L299 Pruritus, unspecified: Secondary | ICD-10-CM | POA: Diagnosis not present

## 2017-10-03 DIAGNOSIS — L508 Other urticaria: Secondary | ICD-10-CM | POA: Diagnosis not present

## 2017-11-28 ENCOUNTER — Encounter: Payer: Self-pay | Admitting: Family Medicine

## 2017-11-28 DIAGNOSIS — R809 Proteinuria, unspecified: Secondary | ICD-10-CM | POA: Diagnosis not present

## 2017-11-28 DIAGNOSIS — N281 Cyst of kidney, acquired: Secondary | ICD-10-CM | POA: Diagnosis not present

## 2017-11-28 DIAGNOSIS — N183 Chronic kidney disease, stage 3 (moderate): Secondary | ICD-10-CM | POA: Diagnosis not present

## 2017-12-03 ENCOUNTER — Other Ambulatory Visit: Payer: Self-pay | Admitting: Nephrology

## 2017-12-03 DIAGNOSIS — N281 Cyst of kidney, acquired: Secondary | ICD-10-CM

## 2017-12-03 DIAGNOSIS — R809 Proteinuria, unspecified: Secondary | ICD-10-CM

## 2017-12-03 DIAGNOSIS — N183 Chronic kidney disease, stage 3 unspecified: Secondary | ICD-10-CM

## 2017-12-05 ENCOUNTER — Ambulatory Visit (INDEPENDENT_AMBULATORY_CARE_PROVIDER_SITE_OTHER): Payer: Medicare Other

## 2017-12-05 ENCOUNTER — Other Ambulatory Visit: Payer: Self-pay

## 2017-12-05 ENCOUNTER — Telehealth: Payer: Self-pay

## 2017-12-05 ENCOUNTER — Other Ambulatory Visit: Payer: Self-pay | Admitting: Family Medicine

## 2017-12-05 VITALS — BP 122/72 | HR 69 | Temp 97.8°F | Resp 14 | Ht 61.5 in | Wt 166.8 lb

## 2017-12-05 DIAGNOSIS — Z1231 Encounter for screening mammogram for malignant neoplasm of breast: Secondary | ICD-10-CM

## 2017-12-05 DIAGNOSIS — Z0000 Encounter for general adult medical examination without abnormal findings: Secondary | ICD-10-CM

## 2017-12-05 NOTE — Patient Instructions (Addendum)
  Ms. Moors , Thank you for taking time to come for your Medicare Wellness Visit. I appreciate your ongoing commitment to your health goals. Please review the following plan we discussed and let me know if I can assist you in the future.   Follow up as needed.    Bring a copy of your [place] and/or Living Will to be scanned into chart.  Have a great day!  These are the goals we discussed: Goals    . DIET - INCREASE WATER INTAKE     Decrease diet coke and coffee intake       This is a list of the screening recommended for you and due dates:  Health Maintenance  Topic Date Due  . Pneumonia vaccines (2 of 2 - PCV13) 11/22/2016  . Flu Shot  11/28/2017  . Mammogram  10/12/2018  . Colon Cancer Screening  11/23/2022  . Tetanus Vaccine  05/28/2024  . DEXA scan (bone density measurement)  Completed

## 2017-12-05 NOTE — Progress Notes (Addendum)
Subjective:   Beth Hart is a 67 y.o. female who presents for an Initial Medicare Annual Wellness Visit.  Review of Systems    No ROS.  Medicare Wellness Visit. Additional risk factors are reflected in the social history.  Cardiac Risk Factors include: advanced age (>80men, >19 women)     Objective:    Today's Vitals   12/05/17 0921  BP: 122/72  Pulse: 69  Resp: 14  Temp: 97.8 F (36.6 C)  TempSrc: Oral  SpO2: 97%  Weight: 166 lb 12.8 oz (75.7 kg)  Height: 5' 1.5" (1.562 m)   Body mass index is 31.01 kg/m.  Advanced Directives 12/05/2017  Does Patient Have a Medical Advance Directive? Yes  Type of Paramedic of [place];Living will  Does patient want to make changes to medical advance directive? No - Patient declined  Copy of [place] in Chart? No - copy requested    Current Medications (verified) Outpatient Encounter Medications as of 12/05/2017  Medication Sig  . CALCIUM-VITAMINS C & D PO Take by mouth.  . citalopram (CELEXA) 20 MG tablet Take 0.5 tablets (10 mg total) by mouth See admin instructions.  . Multiple Vitamin (MULTIVITAMIN) tablet Take 1 tablet by mouth daily.   No facility-administered encounter medications on file as of 12/05/2017.     Allergies (verified) Patient has no known allergies.   History: Past Medical History:  Diagnosis Date  . Anemia   . Arthritis   . Asthma    as a child  . Chicken pox   . Colon polyps 2012  . Depression   . Hemorrhoids    Past Surgical History:  Procedure Laterality Date  . BREAST CYST ASPIRATION Left   . BREAST SURGERY  2007  . CARPAL TUNNEL RELEASE  2010  . REFRACTIVE SURGERY    . TONSILLECTOMY     Family History  Problem Relation Age of Onset  . Alzheimer's disease Mother   . Lung cancer Father   . Breast cancer Neg Hx    Social History   Socioeconomic History  . Marital status: Married    Spouse name: Not on file  . Number of children: Not  on file  . Years of education: Not on file  . Highest education level: Not on file  Occupational History  . Not on file  Social Needs  . Financial resource strain: Not hard at all  . Food insecurity:    Worry: Never true    Inability: Never true  . Transportation needs:    Medical: No    Non-medical: No  Tobacco Use  . Smoking status: Never Smoker  . Smokeless tobacco: Never Used  Substance and Sexual Activity  . Alcohol use: No  . Drug use: No  . Sexual activity: Yes  Lifestyle  . Physical activity:    Days per week: 2 days    Minutes per session: 30 min  . Stress: Not at all  Relationships  . Social connections:    Talks on phone: More than three times a week    Gets together: More than three times a week    Attends religious service: Not on file    Active member of club or organization: Not on file    Attends meetings of clubs or organizations: Not on file    Relationship status: Married  Other Topics Concern  . Not on file  Social History Narrative  . Not on file    Tobacco  Counseling Counseling given: Not Answered   Clinical Intake:  Pre-visit preparation completed: Yes  Pain : No/denies pain     Nutritional Status: BMI > 30  Obese Diabetes: No  How often do you need to have someone help you when you read instructions, pamphlets, or other written materials from your doctor or pharmacy?: 1 - Never  Interpreter Needed?: No      Activities of Daily Living In your present state of health, do you have any difficulty performing the following activities: 12/05/2017  Hearing? N  Vision? N  Difficulty concentrating or making decisions? N  Walking or climbing stairs? N  Dressing or bathing? N  Doing errands, shopping? N  Preparing Food and eating ? N  Using the Toilet? N  In the past six months, have you accidently leaked urine? N  Do you have problems with loss of bowel control? N  Managing your Medications? N  Managing your Finances? N    Housekeeping or managing your Housekeeping? N  Some recent data might be hidden     Immunizations and Health Maintenance Immunization History  Administered Date(s) Administered  . Influenza, High Dose Seasonal PF 02/07/2017  . Pneumococcal Polysaccharide-23 11/23/2015  . Tdap 05/28/2014   Health Maintenance Due  Topic Date Due  . PNA vac Low Risk Adult (2 of 2 - PCV13) 11/22/2016  . INFLUENZA VACCINE  11/28/2017    Patient Care Team: Leone [place], MD as PCP - General (Family Medicine)  Indicate any recent Medical Services you may have received from other than [facility] providers in the past year (date may be approximate).     Assessment:   This is a routine wellness examination for Beth Hart.  The goal of the wellness visit is to assist the patient how to close the gaps in care and create a preventative care plan for the patient.   The roster of all physicians providing medical care to patient is listed in the Snapshot section of the chart.  Taking calcium via multivitamin/Osteoporosis risk reviewed.    Safety issues reviewed; Smoke and carbon monoxide detectors in the home. No firearms in the home. Wears seatbelts when driving or riding with others. No violence in the home.  They do not have excessive sun exposure.  Discussed the need for sun protection: hats, long sleeves and the use of sunscreen if there is significant sun exposure.  Patient is alert, normal appearance, oriented to person/place/and time. Correctly identified the president of the [place] and recalls of 3/3 words.Performs simple calculations and can read correct time from watch face. Displays appropriate judgement.  No new identified risk were noted.  No failures at ADL's or IADL's.    BMI- discussed the importance of a healthy diet, water intake and the benefits of aerobic exercise. Educational material provided.   24 hour diet recall: Regular diet  Dental- every 12 months.  Eye- Visual acuity not  assessed per patient preference since they have regular follow up with the ophthalmologist.  Wears corrective lenses.  Sleep patterns- Sleeps 8-9 hours at night.  Wakes feeling rested.   Health maintenance discussed. She plans to verify with local pharmacy if she has received prevnar 13.   Discussed shingles vaccine. Educational material provided.   Patient Concerns: None at this time. Follow up with PCP as needed.  Hearing/Vision screen Hearing Screening Comments: Patient is able to hear conversational tones. Hx of less than normal hearing R ear due to an infection years ago.  States this does not  interfere with day to day activities or conversation.  Audiology deferred in the last year per patient request.    Vision Screening Comments: Followed by [facility] (Dr. Jeni Salles) Wears corrective lenses to drive and read Last OV 2018; scheduled exam 01/02/18 Lasik surgery Visual acuity not assessed per patient preference since they have regular follow up with the ophthalmologist  Dietary issues and exercise activities discussed: Current Exercise Habits: Home exercise routine, Type of exercise: walking, Time (Minutes): 30, Frequency (Times/Week): 4, Weekly Exercise (Minutes/Week): 120, Intensity: Moderate  Goals    . DIET - INCREASE WATER INTAKE     Decrease diet coke and coffee intake      Depression Screen PHQ 2/9 Scores 12/05/2017 11/23/2015  PHQ - 2 Score 0 0    Fall Risk Fall Risk  12/05/2017 02/07/2017 11/23/2015  Falls in the past year? No No No   Cognitive Function: MMSE - Mini Mental State Exam 12/05/2017  Orientation to time 5  Orientation to Place 5  Registration 3  Attention/ Calculation 5  Recall 3  Language- name 2 objects 2  Language- repeat 1  Language- follow 3 step command 3  Language- read & follow direction 1  Write a sentence 1  Copy design 1  Total score 30       Screening Tests Health Maintenance  Topic Date Due  . PNA vac Low Risk Adult (2  of 2 - PCV13) 11/22/2016  . INFLUENZA VACCINE  11/28/2017  . MAMMOGRAM  10/12/2018  . COLONOSCOPY  11/23/2022  . TETANUS/TDAP  05/28/2024  . DEXA SCAN  Completed     Plan:   End of life planning; Advance aging; Advanced directives discussed. Copy of current HCPOA/Living Will requested.    I have personally reviewed and noted the following in the patient's chart:   . Medical and social history . Use of alcohol, tobacco or illicit drugs  . Current medications and supplements . Functional ability and status . Nutritional status . Physical activity . Advanced directives . List of other physicians . Hospitalizations, surgeries, and ER visits in previous 12 months . Vitals . Screenings to include cognitive, depression, and falls . Referrals and appointments  In addition, I have reviewed and discussed with patient certain preventive protocols, quality metrics, and best practice recommendations. A written personalized care plan for preventive services as well as general preventive health recommendations were provided to patient.     Varney Biles, LPN   01/05/3531   Agree with above

## 2017-12-05 NOTE — Telephone Encounter (Signed)
Copied from [place] [id number]. Topic: General - Other >> Dec 05, 2017  1:19 PM Williams[place]Neal, Sade R wrote: Pt called in and stated she wants the ammonia shot information added to her records. It was given to her by [employer] on 02/12/2017 the shot name was Trevnar 13 >> Dec 05, 2017  1:22 PM [place], Sade R wrote: Correction * Prevnar 13   Pneumonia vaccination added to patients immunizations list.

## 2017-12-11 ENCOUNTER — Ambulatory Visit
Admission: RE | Admit: 2017-12-11 | Discharge: 2017-12-11 | Disposition: A | Discharge status: Home / Self Care | Payer: Medicare Other | Source: Ambulatory Visit | Attending: Nephrology | Admitting: Nephrology

## 2017-12-11 DIAGNOSIS — N183 Chronic kidney disease, stage 3 unspecified: Secondary | ICD-10-CM

## 2017-12-11 DIAGNOSIS — R809 Proteinuria, unspecified: Secondary | ICD-10-CM | POA: Insufficient documentation

## 2017-12-11 DIAGNOSIS — N281 Cyst of kidney, acquired: Secondary | ICD-10-CM | POA: Insufficient documentation

## 2017-12-11 DIAGNOSIS — N261 Atrophy of kidney (terminal): Secondary | ICD-10-CM | POA: Diagnosis not present

## 2018-01-02 ENCOUNTER — Ambulatory Visit
Admission: RE | Admit: 2018-01-02 | Discharge: 2018-01-02 | Disposition: A | Discharge status: Home / Self Care | Payer: Medicare Other | Source: Ambulatory Visit | Attending: Family Medicine | Admitting: Family Medicine

## 2018-01-02 DIAGNOSIS — Z1231 Encounter for screening mammogram for malignant neoplasm of breast: Secondary | ICD-10-CM | POA: Insufficient documentation

## 2018-01-02 DIAGNOSIS — H43813 Vitreous degeneration, bilateral: Secondary | ICD-10-CM | POA: Diagnosis not present

## 2018-01-02 DIAGNOSIS — H2513 Age-related nuclear cataract, bilateral: Secondary | ICD-10-CM | POA: Diagnosis not present

## 2018-02-06 DIAGNOSIS — Z23 Encounter for immunization: Secondary | ICD-10-CM | POA: Diagnosis not present

## 2018-03-24 ENCOUNTER — Other Ambulatory Visit: Payer: Self-pay | Admitting: Family Medicine

## 2018-04-21 ENCOUNTER — Other Ambulatory Visit: Payer: Self-pay | Admitting: Family Medicine

## 2018-04-21 NOTE — Telephone Encounter (Signed)
Copied from [place] [phone]. Topic: Quick Communication - Rx Refill/Question >> Apr 21, 2018  9:14 AM Judyann Munson wrote: Medication: citalopram (CELEXA) 20 MG tablet  Has the patient contacted their pharmacy? {yes   Preferred Pharmacy (with phone number or street name): [id number] [address], [place] [phone] (Phone) [phone] (Fax)    Agent: Please be advised that RX refills may take up to 3 business days. We ask that you follow-up with your pharmacy.

## 2018-06-12 DIAGNOSIS — N183 Chronic kidney disease, stage 3 (moderate): Secondary | ICD-10-CM | POA: Diagnosis not present

## 2018-06-12 DIAGNOSIS — R809 Proteinuria, unspecified: Secondary | ICD-10-CM | POA: Diagnosis not present

## 2018-06-12 LAB — BASIC METABOLIC PANEL
BUN: 25 — AB (ref 4–21)
CREATININE: 1.3 — AB (ref <=1.1)
Glucose: 84
Potassium: 4.4 (ref 3.4–5.3)
Sodium: 139 (ref 137–147)

## 2018-06-14 LAB — CBC AND DIFFERENTIAL
HEMOGLOBIN: 12.5 (ref 12.0–16.0)
Neutrophils Absolute: 3830
PLATELETS: 214 (ref 150–399)
WBC: 6.3

## 2018-11-14 ENCOUNTER — Telehealth: Payer: Self-pay | Admitting: Family Medicine

## 2018-11-14 DIAGNOSIS — J989 Respiratory disorder, unspecified: Secondary | ICD-10-CM

## 2018-11-14 NOTE — Telephone Encounter (Signed)
I am happy to order this for her.  She should just be able to show up at the grand [place] building at [employer] in [place].  They are doing testing between 8 AM and 4 PM.  She would need to be in line by 3:45 PM to be tested today.  She will need to wear a mask and anyone else in the car with her will need to wear a mask.  She will need to quarantine at home until the test result comes back which means not leaving the house at all unless she needs to seek medical attention.  It would be preferable if anyone that lives with her would also quarantine at home as well.  She should not have anybody visit her as part of her quarantine.  It could be 2 to 7 days prior to the test result returning.  Please ensure that she is not having any chest pain or shortness of breath or fevers.  Please also see if she has any known exposures to COVID-19.  She additionally has not been seen in greater than a year and should have a follow-up visit.  We could offer her a virtual visit for follow-up and evaluation of her current symptoms with me for early next week or she could be set up with a virtual visit today to evaluate her current symptoms with another provider who has available appointments.  Please let me know if she is having any of the symptoms that are listed above.  Thanks.

## 2018-11-14 NOTE — Telephone Encounter (Signed)
Patient denies chest pain or SOB and no known exposure to COVID, patient will quarantine and anyone in her home until results are attained or symptoms resolve for 72 hours afterwards.  Patient scheduled Doxy with PCP for Monday. Patient was advised worsening symptoms she should seek medical attention especially for chest pain or SOB.

## 2018-11-14 NOTE — Telephone Encounter (Signed)
Pt called and stated that she would like to be tested for covid. Pt states that she has had some weakness, dry cough, nasal mucus and strange taste in mouth. Please advise

## 2018-11-17 ENCOUNTER — Encounter: Payer: Self-pay | Admitting: Family Medicine

## 2018-11-17 ENCOUNTER — Other Ambulatory Visit: Payer: Self-pay

## 2018-11-17 ENCOUNTER — Ambulatory Visit (INDEPENDENT_AMBULATORY_CARE_PROVIDER_SITE_OTHER): Payer: Medicare Other | Admitting: Family Medicine

## 2018-11-17 DIAGNOSIS — R05 Cough: Secondary | ICD-10-CM

## 2018-11-17 DIAGNOSIS — R5383 Other fatigue: Secondary | ICD-10-CM

## 2018-11-17 DIAGNOSIS — Z20828 Contact with and (suspected) exposure to other viral communicable diseases: Secondary | ICD-10-CM | POA: Diagnosis not present

## 2018-11-17 DIAGNOSIS — F330 Major depressive disorder, recurrent, mild: Secondary | ICD-10-CM | POA: Diagnosis not present

## 2018-11-17 DIAGNOSIS — Z20822 Contact with and (suspected) exposure to covid-19: Secondary | ICD-10-CM | POA: Insufficient documentation

## 2018-11-17 NOTE — Assessment & Plan Note (Signed)
I strongly suspect COVID-19 particularly given her constellation of symptoms with diminished taste being of particular concern in combination with her cough and overall weakness.  Discussed that we will await the results of the test.  Once the test results return we will have to determine if she would need to be retested and continue quarantine versus evaluating for alternative causes if this was a negative test.  She will remain under quarantine.  Her household occupants will remain under quarantine as well.  I advised that she let her son and daughter-in-law know that they should contact their physicians and her children's pediatrician to determine if they need to be tested as well.  She was given reasons to seek medical attention in the emergency department.  Did discuss the natural course of COVID-19 and that it can take up to a month to fully resolve.

## 2018-11-17 NOTE — Assessment & Plan Note (Signed)
Mildly worse with feeling ill and the COVID-19 pandemic.  She notes she is handling this fairly well at this time and she will continue to monitor and continue her Celexa.

## 2018-11-17 NOTE — Progress Notes (Signed)
Virtual Visit via telephone Note  This visit type was conducted due to national recommendations for restrictions regarding the COVID-19 pandemic (e.g. social distancing).  This format is felt to be most appropriate for this patient at this time.  All issues noted in this document were discussed and addressed.  No physical exam was performed (except for noted visual exam findings with Video Visits).   I connected with Beth Hart today at  1:15 PM EDT by telephone and verified that I am speaking with the correct person using two identifiers. Location patient: home Location provider: work  Persons participating in the virtual visit: patient, provider  I discussed the limitations, risks, security and privacy concerns of performing an evaluation and management service by telephone and the availability of in person appointments. I also discussed with the patient that there may be a patient responsible charge related to this service. The patient expressed understanding and agreed to proceed.  Interactive audio and video telecommunications were attempted between this provider and patient, however failed, due to patient having technical difficulties OR patient did not have access to video capability.  We continued and completed visit with audio only.   Reason for visit: same day visit  HPI: Respiratory illness: patient notes symptoms starting about 2 weeks ago.  Started with feeling drained.  She felt as though her energy level was decreased by 50% or more.  She has had a dry cough with some nasal mucus.  She has had diminished taste.  She has felt weak in her knees like she had the flu.  No shortness of breath.  She does note a couple of episodes of tightness in her chest that resolved very quickly and did not radiate.  There is no associated diaphoresis.  She has had decreased appetite.  She notes no fevers.  She has been quarantining since today she called [place] last week.  She has not left her house  and nobody has come to her house.  She was previously providing care for her grandchildren when she had some of the symptoms.  Depression: Patient notes this is mild though is more so than usual for this time a year.  A lot of it has to do with feeling poorly and the COVID-19 pandemic.  She is on Celexa and notes that is beneficial.  She notes she has a fairly good handle on this currently.  No SI.   ROS: See pertinent positives and negatives per HPI.  Past Medical History:  Diagnosis Date  . Anemia   . Arthritis   . Asthma    as a child  . Chicken pox   . Colon polyps 2012  . Depression   . Hemorrhoids     Past Surgical History:  Procedure Laterality Date  . BREAST CYST ASPIRATION Left   . BREAST SURGERY  2007   aspiration  . CARPAL TUNNEL RELEASE  2010  . REFRACTIVE SURGERY    . TONSILLECTOMY      Family History  Problem Relation Age of Onset  . Alzheimer's disease Mother   . Lung cancer Father   . Breast cancer Neg Hx     SOCIAL HX: Non-smoker.   Current Outpatient Medications:  .  CALCIUM-VITAMINS C & D PO, Take by mouth., Disp: , Rfl:  .  citalopram (CELEXA) 20 MG tablet, TAKE 0.5 TABLETS (10 MG TOTAL) BY MOUTH., Disp: 45 tablet, Rfl: 7 .  Multiple Vitamin (MULTIVITAMIN) tablet, Take 1 tablet by mouth daily., Disp: , Rfl:  EXAM: This was a telehealth telephone visit notes no physical exam was completed.  Patient was speaking in full sentences with no obvious shortness of breath over the phone.  ASSESSMENT AND PLAN:  Discussed the following assessment and plan:  Suspected Covid-19 Virus Infection I strongly suspect COVID-19 particularly given her constellation of symptoms with diminished taste being of particular concern in combination with her cough and overall weakness.  Discussed that we will await the results of the test.  Once the test results return we will have to determine if she would need to be retested and continue quarantine versus evaluating for  alternative causes if this was a negative test.  She will remain under quarantine.  Her household occupants will remain under quarantine as well.  I advised that she let her son and daughter-in-law know that they should contact their physicians and her children's pediatrician to determine if they need to be tested as well.  She was given reasons to seek medical attention in the emergency department.  Did discuss the natural course of COVID-19 and that it can take up to a month to fully resolve.  Depression Mildly worse with feeling ill and the COVID-19 pandemic.  She notes she is handling this fairly well at this time and she will continue to monitor and continue her Celexa.    I discussed the assessment and treatment plan with the patient. The patient was provided an opportunity to ask questions and all were answered. The patient agreed with the plan and demonstrated an understanding of the instructions.   The patient was advised to call back or seek an in-person evaluation if the symptoms worsen or if the condition fails to improve as anticipated.  I provided 15 minutes of non-face-to-face time during this encounter.   Tommi Rumps, MD

## 2018-11-19 LAB — NOVEL CORONAVIRUS, NAA: SARS-CoV-2, NAA: NOT DETECTED

## 2018-11-20 ENCOUNTER — Encounter: Payer: Self-pay | Admitting: Family Medicine

## 2018-12-02 ENCOUNTER — Other Ambulatory Visit: Payer: Self-pay | Admitting: Family Medicine

## 2018-12-02 DIAGNOSIS — Z1231 Encounter for screening mammogram for malignant neoplasm of breast: Secondary | ICD-10-CM

## 2018-12-08 ENCOUNTER — Other Ambulatory Visit: Payer: Self-pay

## 2018-12-08 ENCOUNTER — Ambulatory Visit (INDEPENDENT_AMBULATORY_CARE_PROVIDER_SITE_OTHER): Payer: Medicare Other

## 2018-12-08 DIAGNOSIS — Z0000 Encounter for general adult medical examination without abnormal findings: Secondary | ICD-10-CM | POA: Diagnosis not present

## 2018-12-08 NOTE — Progress Notes (Signed)
Subjective:   Beth Hart is a 68 y.o. female who presents for Medicare Annual (Subsequent) preventive examination.  Review of Systems:  No ROS.  Medicare Wellness Virtual Visit.  Visual/audio telehealth visit, UTA vital signs.   See social history for additional risk factors.   Cardiac Risk Factors include: advanced age (>87men, >36 women)     Objective:     Vitals: There were no vitals taken for this visit.  There is no height or weight on file to calculate BMI.  Advanced Directives 12/08/2018 12/05/2017  Does Patient Have a Medical Advance Directive? Yes Yes  Type of Paramedic of [place];Living will [place];Living will  Does patient want to make changes to medical advance directive? No - Patient declined No - Patient declined  Copy of [place] in Chart? No - copy requested No - copy requested    Tobacco Social History   Tobacco Use  Smoking Status Never Smoker  Smokeless Tobacco Never Used     Counseling given: Not Answered   Clinical Intake:  Pre-visit preparation completed: Yes        Diabetes: No  How often do you need to have someone help you when you read instructions, pamphlets, or other written materials from your doctor or pharmacy?: 1 - Never  Interpreter Needed?: No     Past Medical History:  Diagnosis Date  . Anemia   . Arthritis   . Asthma    as a child  . Chicken pox   . Colon polyps 2012  . Depression   . Hemorrhoids    Past Surgical History:  Procedure Laterality Date  . BREAST CYST ASPIRATION Left   . BREAST SURGERY  2007   aspiration  . CARPAL TUNNEL RELEASE  2010  . REFRACTIVE SURGERY    . TONSILLECTOMY     Family History  Problem Relation Age of Onset  . Alzheimer's disease Mother   . Lung cancer Father   . Breast cancer Neg Hx    Social History   Socioeconomic History  . Marital status: Married    Spouse name: Not on file  . Number of  children: Not on file  . Years of education: Not on file  . Highest education level: Not on file  Occupational History  . Not on file  Social Needs  . Financial resource strain: Not hard at all  . Food insecurity    Worry: Never true    Inability: Never true  . Transportation needs    Medical: No    Non-medical: No  Tobacco Use  . Smoking status: Never Smoker  . Smokeless tobacco: Never Used  Substance and Sexual Activity  . Alcohol use: No  . Drug use: No  . Sexual activity: Yes  Lifestyle  . Physical activity    Days per week: 2 days    Minutes per session: 30 min  . Stress: Not at all  Relationships  . Social connections    Talks on phone: More than three times a week    Gets together: More than three times a week    Attends religious service: Not on file    Active member of club or organization: Not on file    Attends meetings of clubs or organizations: Not on file    Relationship status: Married  Other Topics Concern  . Not on file  Social History Narrative  . Not on file    Outpatient Encounter  Medications as of 12/08/2018  Medication Sig  . CALCIUM-VITAMINS C & D PO Take by mouth.  . citalopram (CELEXA) 20 MG tablet TAKE 0.5 TABLETS (10 MG TOTAL) BY MOUTH.  . Multiple Vitamin (MULTIVITAMIN) tablet Take 1 tablet by mouth daily.   No facility-administered encounter medications on file as of 12/08/2018.     Activities of Daily Living In your present state of health, do you have any difficulty performing the following activities: 12/08/2018  Hearing? N  Vision? N  Difficulty concentrating or making decisions? N  Walking or climbing stairs? N  Dressing or bathing? N  Doing errands, shopping? N  Preparing Food and eating ? N  Using the Toilet? N  In the past six months, have you accidently leaked urine? N  Do you have problems with loss of bowel control? N  Managing your Medications? N  Managing your Finances? N  Housekeeping or managing your Housekeeping?  N  Some recent data might be hidden    Patient Care Team: Leone Haven, MD as PCP - General (Family Medicine)    Assessment:   This is a routine wellness examination for Beth Hart.  I connected with patient 12/08/18 at  1:00 PM EDT by an audio enabled telemedicine application and verified that I am speaking with the correct person using two identifiers. Patient stated full name and DOB. Patient gave permission to continue with virtual visit. Patient's location was at home and Nurse's location was at [place] office.   Health Screenings  Mammogram - 12/2017; scheduled 12/2018 Colonoscopy - 10/2012 Bone Density - 10/2010 Glaucoma -none Hearing -demonstrates normal hearing during visit. Followed by pcp Cholesterol - 02/2017 Dental- UTD Vision- visits within the last 12 months.  Social  Alcohol intake - no         Smoking history- never    Smokers in home? none Illicit drug use? none Physical activity - gardening, keeping her grandchildren  Diet - no sugar; regular  Sexually Active -not currently BMI- discussed the importance of a healthy diet, water intake and the benefits of aerobic exercise.  Educational material provided.   Safety  Patient feels safe at home- yes Patient does have smoke detectors at home- yes Patient does wear sunscreen or protective clothing when in direct sunlight -yes Patient does wear seat belt when in a moving vehicle -yes Patient drives- yes  [id number] precautions and sickness symptoms discussed.   Activities of Daily Living Patient denies needing assistance with: driving, household chores, feeding themselves, getting from bed to chair, getting to the toilet, bathing/showering, dressing, managing money, or preparing meals.  No new identified risk were noted.    Depression Screen Followed by pcp. States she is feeling better overall. Denies losing interest in daily life, feeling hopeless, or crying easily over simple problems.   Medication-taking as  directed and without issues.   Fall Screen Patient denies being afraid of falling or falling in the last year.   Memory Screen Patient is alert.  Patient denies difficulty focusing, concentrating or misplacing items. Correctly identified the president of the [place], season and recall. Patient likes to read and complete jigsaw puzzles for brain stimulation.  Immunizations The following Immunizations were discussed: Influenza, shingles, pneumonia, and tetanus.   Other Providers Patient Care Team: Leone Haven, MD as PCP - General (Family Medicine)  Exercise Activities and Dietary recommendations Current Exercise Habits: Home exercise routine, Intensity: Mild  Goals    . Follow up with Primary Care Provider  Keep all routine maintenance appointments       Fall Risk Fall Risk  12/08/2018 11/17/2018 12/05/2017 02/07/2017 11/23/2015  Falls in the past year? 0 0 No No No  Is the patient's home free of loose throw rugs in walkways, pet beds, electrical cords, etc? yes      Grab bars in the bathroom? yes      Handrails on the stairs? yes      Adequate lighting? yes     Depression Screen PHQ 2/9 Scores 12/08/2018 12/05/2017 11/23/2015  PHQ - 2 Score 1 0 0     Cognitive Function MMSE - Mini Mental State Exam 12/05/2017  Orientation to time 5  Orientation to Place 5  Registration 3  Attention/ Calculation 5  Recall 3  Language- name 2 objects 2  Language- repeat 1  Language- follow 3 step command 3  Language- read & follow direction 1  Write a sentence 1  Copy design 1  Total score 30     6CIT Screen 12/08/2018  What Year? 0 points  What month? 0 points  What time? 0 points  Count back from 20 0 points  Months in reverse 0 points  Repeat phrase 0 points  Total Score 0    Immunization History  Administered Date(s) Administered  . Influenza, High Dose Seasonal PF 02/07/2017  . Pneumococcal Conjugate-13 02/12/2017  . Pneumococcal Polysaccharide-23 11/23/2015  . Tdap  05/28/2014   Screening Tests Health Maintenance  Topic Date Due  . INFLUENZA VACCINE  11/29/2018  . MAMMOGRAM  01/03/2020  . COLONOSCOPY  11/23/2022  . TETANUS/TDAP  05/28/2024  . DEXA SCAN  Completed  . PNA vac Low Risk Adult  Completed      Plan:    End of life planning; Advance aging; Advanced directives discussed.  Copy of current HCPOA/Living Will requested.    I have personally reviewed and noted the following in the patient's chart:   . Medical and social history . Use of alcohol, tobacco or illicit drugs  . Current medications and supplements . Functional ability and status . Nutritional status . Physical activity . Advanced directives . List of other physicians . Hospitalizations, surgeries, and ER visits in previous 12 months . Vitals . Screenings to include cognitive, depression, and falls . Referrals and appointments  In addition, I have reviewed and discussed with patient certain preventive protocols, quality metrics, and best practice recommendations. A written personalized care plan for preventive services as well as general preventive health recommendations were provided to patient.     Varney Biles, LPN  [id number]

## 2018-12-08 NOTE — Patient Instructions (Addendum)
  Ms. Puller , Thank you for taking time to come for your Medicare Wellness Visit. I appreciate your ongoing commitment to your health goals. Please review the following plan we discussed and let me know if I can assist you in the future.   These are the goals we discussed: Goals    . Follow up with Primary Care Provider     Keep all routine maintenance appointments       This is a list of the screening recommended for you and due dates:  Health Maintenance  Topic Date Due  . Flu Shot  11/29/2018  . Mammogram  01/03/2020  . Colon Cancer Screening  11/23/2022  . Tetanus Vaccine  05/28/2024  . DEXA scan (bone density measurement)  Completed  . Pneumonia vaccines  Completed

## 2018-12-10 ENCOUNTER — Other Ambulatory Visit: Payer: Self-pay

## 2018-12-10 ENCOUNTER — Ambulatory Visit (INDEPENDENT_AMBULATORY_CARE_PROVIDER_SITE_OTHER): Payer: Medicare Other | Admitting: Family Medicine

## 2018-12-10 ENCOUNTER — Ambulatory Visit: Payer: Medicare Other

## 2018-12-10 ENCOUNTER — Encounter: Payer: Self-pay | Admitting: Family Medicine

## 2018-12-10 DIAGNOSIS — N183 Chronic kidney disease, stage 3 unspecified: Secondary | ICD-10-CM

## 2018-12-10 DIAGNOSIS — F330 Major depressive disorder, recurrent, mild: Secondary | ICD-10-CM | POA: Diagnosis not present

## 2018-12-10 DIAGNOSIS — E785 Hyperlipidemia, unspecified: Secondary | ICD-10-CM | POA: Diagnosis not present

## 2018-12-10 DIAGNOSIS — Z20822 Contact with and (suspected) exposure to covid-19: Secondary | ICD-10-CM

## 2018-12-10 NOTE — Assessment & Plan Note (Signed)
Mild symptoms related to the pandemic.  She will continue Celexa and monitor for any worsening.

## 2018-12-10 NOTE — Assessment & Plan Note (Signed)
Avoid NSAIDs.  She will continue to see nephrology.

## 2018-12-10 NOTE — Assessment & Plan Note (Signed)
Patient has recovered.  I do suspect she had COVID-19 given her constellation of symptoms.  She feels well at this time.  Discussed that she still needs to follow social distancing precautions and sick precautions as it is possible that if she had COVID-19 she could still get it again.

## 2018-12-10 NOTE — Progress Notes (Signed)
Virtual Visit via telephone Note  This visit type was conducted due to national recommendations for restrictions regarding the COVID-19 pandemic (e.g. social distancing).  This format is felt to be most appropriate for this patient at this time.  All issues noted in this document were discussed and addressed.  No physical exam was performed (except for noted visual exam findings with Video Visits).   I connected with Beth Hart today at  1:45 PM EDT by telephone and verified that I am speaking with the correct person using two identifiers. Location patient: home Location provider: work  Persons participating in the virtual visit: patient, provider  I discussed the limitations, risks, security and privacy concerns of performing an evaluation and management service by telephone and the availability of in person appointments. I also discussed with the patient that there may be a patient responsible charge related to this service. The patient expressed understanding and agreed to proceed.  Interactive audio and video telecommunications were attempted between this provider and patient, however failed, due to patient having technical difficulties OR patient did not have access to video capability.  We continued and completed visit with audio only.   Reason for visit: follow-up  HPI: Depression: mild with the pandemic, though nothing to be concerned about.  She continues on Celexa with good benefit.  No anxiety.  No SI.  Respiratory illness: Patient notes over the last week or so she has gotten back to feeling normal.  Her taste is back.  Her appetite is improved.  She feels as though she is all the way back to normal and has been physically active with no fatigue.  She notes it took close to a month for her to recover.  COVID-19 test was negative.  Hyperlipidemia: No chest pain or claudication.  She chases her grandchildren around twice a week and gardens.  She cooks home-cooked meals and  avoid sugar.  CKD stage III: She avoids NSAIDs.  She follows with nephrology.  Recent blood pressures have been 123/73 and 117/70   ROS: See pertinent positives and negatives per HPI.  Past Medical History:  Diagnosis Date  . Anemia   . Arthritis   . Asthma    as a child  . Chicken pox   . Colon polyps 2012  . Depression   . Hemorrhoids     Past Surgical History:  Procedure Laterality Date  . BREAST CYST ASPIRATION Left   . BREAST SURGERY  2007   aspiration  . CARPAL TUNNEL RELEASE  2010  . REFRACTIVE SURGERY    . TONSILLECTOMY      Family History  Problem Relation Age of Onset  . Alzheimer's disease Mother   . Lung cancer Father   . Breast cancer Neg Hx     SOCIAL HX: Non-smoker.   Current Outpatient Medications:  .  CALCIUM-VITAMINS C & D PO, Take by mouth., Disp: , Rfl:  .  citalopram (CELEXA) 20 MG tablet, TAKE 0.5 TABLETS (10 MG TOTAL) BY MOUTH., Disp: 45 tablet, Rfl: 7 .  Multiple Vitamin (MULTIVITAMIN) tablet, Take 1 tablet by mouth daily., Disp: , Rfl:   EXAM: This was a telehealth telephone visit and thus no physical exam was completed.  ASSESSMENT AND PLAN:  Discussed the following assessment and plan:  Suspected Covid-19 Virus Infection Patient has recovered.  I do suspect she had COVID-19 given her constellation of symptoms.  She feels well at this time.  Discussed that she still needs to follow social distancing precautions  and sick precautions as it is possible that if she had COVID-19 she could still get it again.  Hyperlipidemia Continue diet and exercise.  We will have her come in for lab work in a month.  CKD (chronic kidney disease) stage 3, GFR 30-59 ml/min (HCC) Avoid NSAIDs.  She will continue to see nephrology.  Depression Mild symptoms related to the pandemic.  She will continue Celexa and monitor for any worsening.    I discussed the assessment and treatment plan with the patient. The patient was provided an opportunity to ask  questions and all were answered. The patient agreed with the plan and demonstrated an understanding of the instructions.   The patient was advised to call back or seek an in-person evaluation if the symptoms worsen or if the condition fails to improve as anticipated.  I provided 12 minutes of non-face-to-face time during this encounter.   Tommi Rumps, MD

## 2018-12-10 NOTE — Assessment & Plan Note (Signed)
Continue diet and exercise.  We will have her come in for lab work in a month.

## 2018-12-18 DIAGNOSIS — N183 Chronic kidney disease, stage 3 (moderate): Secondary | ICD-10-CM | POA: Diagnosis not present

## 2018-12-18 LAB — CBC AND DIFFERENTIAL
HCT: 40 (ref 36–46)
Hemoglobin: 13.1 (ref 12.0–16.0)
Platelets: 194 (ref 150–399)
WBC: 7.4

## 2018-12-18 LAB — BASIC METABOLIC PANEL
BUN: 20 (ref 4–21)
Creatinine: 1.1 (ref <=1.1)
Glucose: 97
Potassium: 4.5 (ref 3.4–5.3)
Sodium: 140 (ref 137–147)

## 2018-12-18 LAB — PSA: PSA: 97

## 2019-01-07 ENCOUNTER — Other Ambulatory Visit: Payer: Self-pay

## 2019-01-07 ENCOUNTER — Other Ambulatory Visit (INDEPENDENT_AMBULATORY_CARE_PROVIDER_SITE_OTHER): Payer: Medicare Other

## 2019-01-07 DIAGNOSIS — E785 Hyperlipidemia, unspecified: Secondary | ICD-10-CM

## 2019-01-07 LAB — COMPREHENSIVE METABOLIC PANEL
ALT: 20 U/L (ref 0–35)
AST: 22 U/L (ref 0–37)
Albumin: 4 g/dL (ref 3.5–5.2)
Alkaline Phosphatase: 95 U/L (ref 39–117)
BUN: 17 mg/dL (ref 6–23)
CO2: 26 mEq/L (ref 19–32)
Calcium: 9.3 mg/dL (ref 8.4–10.5)
Chloride: 105 mEq/L (ref 96–112)
Creatinine, Ser: 1.07 mg/dL (ref 0.40–1.20)
GFR: 50.97 mL/min — ABNORMAL LOW (ref >60.00)
Glucose, Bld: 81 mg/dL (ref 70–99)
Potassium: 4.4 mEq/L (ref 3.5–5.1)
Sodium: 138 mEq/L (ref 135–145)
Total Bilirubin: 0.6 mg/dL (ref 0.2–1.2)
Total Protein: 6.6 g/dL (ref 6.0–8.3)

## 2019-01-07 LAB — LIPID PANEL
Cholesterol: 217 mg/dL — ABNORMAL HIGH (ref 0–200)
HDL: 66.2 mg/dL (ref >39.00)
LDL Cholesterol: 134 mg/dL — ABNORMAL HIGH (ref 0–99)
NonHDL: 151.1
Total CHOL/HDL Ratio: 3
Triglycerides: 84 mg/dL (ref 0.0–149.0)
VLDL: 16.8 mg/dL (ref 0.0–40.0)

## 2019-01-08 ENCOUNTER — Ambulatory Visit
Admission: RE | Admit: 2019-01-08 | Discharge: 2019-01-08 | Disposition: A | Discharge status: Home / Self Care | Payer: Medicare Other | Source: Ambulatory Visit | Attending: Family Medicine | Admitting: Family Medicine

## 2019-01-08 DIAGNOSIS — Z1231 Encounter for screening mammogram for malignant neoplasm of breast: Secondary | ICD-10-CM | POA: Insufficient documentation

## 2019-01-27 DIAGNOSIS — Z23 Encounter for immunization: Secondary | ICD-10-CM | POA: Diagnosis not present

## 2019-02-20 ENCOUNTER — Ambulatory Visit: Payer: Medicare Other | Admitting: Family Medicine

## 2019-03-19 DIAGNOSIS — R040 Epistaxis: Secondary | ICD-10-CM | POA: Diagnosis not present

## 2019-04-19 ENCOUNTER — Other Ambulatory Visit: Payer: Self-pay | Admitting: Family Medicine

## 2019-05-13 DIAGNOSIS — M65341 Trigger finger, right ring finger: Secondary | ICD-10-CM | POA: Diagnosis not present

## 2019-06-15 ENCOUNTER — Ambulatory Visit (INDEPENDENT_AMBULATORY_CARE_PROVIDER_SITE_OTHER): Payer: Medicare Other | Admitting: Family Medicine

## 2019-06-15 ENCOUNTER — Encounter: Payer: Self-pay | Admitting: Family Medicine

## 2019-06-15 ENCOUNTER — Other Ambulatory Visit: Payer: Self-pay

## 2019-06-15 DIAGNOSIS — E785 Hyperlipidemia, unspecified: Secondary | ICD-10-CM

## 2019-06-15 DIAGNOSIS — F330 Major depressive disorder, recurrent, mild: Secondary | ICD-10-CM | POA: Diagnosis not present

## 2019-06-15 NOTE — Assessment & Plan Note (Signed)
Mild symptoms.  She would like to stick with her current regimen.  She will continue Celexa.

## 2019-06-15 NOTE — Progress Notes (Signed)
Virtual Visit via telephone Note  This visit type was conducted due to national recommendations for restrictions regarding the COVID-19 pandemic (e.g. social distancing).  This format is felt to be most appropriate for this patient at this time.  All issues noted in this document were discussed and addressed.  No physical exam was performed (except for noted visual exam findings with Video Visits).   I connected with Beth Hart today at  1:45 PM EST by telephone and verified that I am speaking with the correct person using two identifiers. Location patient: Son's house Location provider: work  Persons participating in the virtual visit: patient, provider  I discussed the limitations, risks, security and privacy concerns of performing an evaluation and management service by telephone and the availability of in person appointments. I also discussed with the patient that there may be a patient responsible charge related to this service. The patient expressed understanding and agreed to proceed.  Interactive audio and video telecommunications were attempted between this provider and patient, however failed, due to patient having technical difficulties OR patient did not have access to video capability.  We continued and completed visit with audio only.   Reason for visit: f/u.   HPI: Depression: Patient notes she has slight depression with Covid and the weather.  She has been taking Celexa.  No SI.  Minimal anxiety as well related to the same things.  She does not feel like she needs to change her medications.  Hyperlipidemia: Intermediate ASCVD risk score.  She has been working on diet and exercise.  Eating more oatmeal.  She is been avoiding cheeses.  She stays quite active and gardens and takes care of 2 children several days a week.  No chest pain or claudication.   ROS: See pertinent positives and negatives per HPI.  Past Medical History:  Diagnosis Date  . Anemia   . Arthritis     . Asthma    as a child  . Chicken pox   . Colon polyps 2012  . Depression   . Hemorrhoids     Past Surgical History:  Procedure Laterality Date  . BREAST CYST ASPIRATION Left 90s  . BREAST SURGERY  2007   aspiration  . CARPAL TUNNEL RELEASE  2010  . REFRACTIVE SURGERY    . TONSILLECTOMY      Family History  Problem Relation Age of Onset  . Alzheimer's disease Mother   . Lung cancer Father   . Breast cancer Neg Hx     SOCIAL HX: Non-smoker   Current Outpatient Medications:  .  CALCIUM-VITAMINS C & D PO, Take by mouth., Disp: , Rfl:  .  citalopram (CELEXA) 20 MG tablet, TAKE 0.5 TABLETS (10 MG TOTAL) BY MOUTH., Disp: 45 tablet, Rfl: 7 .  Multiple Vitamin (MULTIVITAMIN) tablet, Take 1 tablet by mouth daily., Disp: , Rfl:   EXAM:  VITALS per patient if applicable:  GENERAL: alert, oriented, appears well and in no acute distress  HEENT: atraumatic, conjunttiva clear, no obvious abnormalities on inspection of external nose and ears  NECK: normal movements of the head and neck  LUNGS: on inspection no signs of respiratory distress, breathing rate appears normal, no obvious gross SOB, gasping or wheezing  CV: no obvious cyanosis  MS: moves all visible extremities without noticeable abnormality  PSYCH/NEURO: pleasant and cooperative, no obvious depression or anxiety, speech and thought processing grossly intact  ASSESSMENT AND PLAN:  Discussed the following assessment and plan:  Depression Mild symptoms.  She would like to stick with her current regimen.  She will continue Celexa.  Hyperlipidemia Continue diet and exercise.  Discussed the potential for medication though she has made some good strides with her total cholesterol being 202 recently on check with blood donation.  Plan on following up in 6 months for total cholesterol panel.   No orders of the defined types were placed in this encounter.   No orders of the defined types were placed in this  encounter.    I discussed the assessment and treatment plan with the patient. The patient was provided an opportunity to ask questions and all were answered. The patient agreed with the plan and demonstrated an understanding of the instructions.   The patient was advised to call back or seek an in-person evaluation if the symptoms worsen or if the condition fails to improve as anticipated.  I provided 8 minutes of non-face-to-face time during this encounter.   Tommi Rumps, MD

## 2019-06-15 NOTE — Assessment & Plan Note (Signed)
Continue diet and exercise.  Discussed the potential for medication though she has made some good strides with her total cholesterol being 202 recently on check with blood donation.  Plan on following up in 6 months for total cholesterol panel.

## 2019-06-25 DIAGNOSIS — R809 Proteinuria, unspecified: Secondary | ICD-10-CM | POA: Diagnosis not present

## 2019-06-25 DIAGNOSIS — N1831 Chronic kidney disease, stage 3a: Secondary | ICD-10-CM | POA: Diagnosis not present

## 2019-06-25 DIAGNOSIS — R8290 Unspecified abnormal findings in urine: Secondary | ICD-10-CM | POA: Diagnosis not present

## 2019-08-13 DIAGNOSIS — Z9889 Other specified postprocedural states: Secondary | ICD-10-CM | POA: Diagnosis not present

## 2019-08-13 DIAGNOSIS — M1711 Unilateral primary osteoarthritis, right knee: Secondary | ICD-10-CM | POA: Diagnosis not present

## 2019-08-13 DIAGNOSIS — M65341 Trigger finger, right ring finger: Secondary | ICD-10-CM | POA: Diagnosis not present

## 2019-10-09 IMAGING — MG MM DIGITAL SCREENING BILAT W/ TOMO W/ CAD
8 series · 8 of 24 positions shown · non-contrast
Comparison: Previous exam(s).

CLINICAL DATA: Screening.

EXAM:
DIGITAL SCREENING BILATERAL MAMMOGRAM WITH TOMO AND CAD

[L MLO synth-2D]
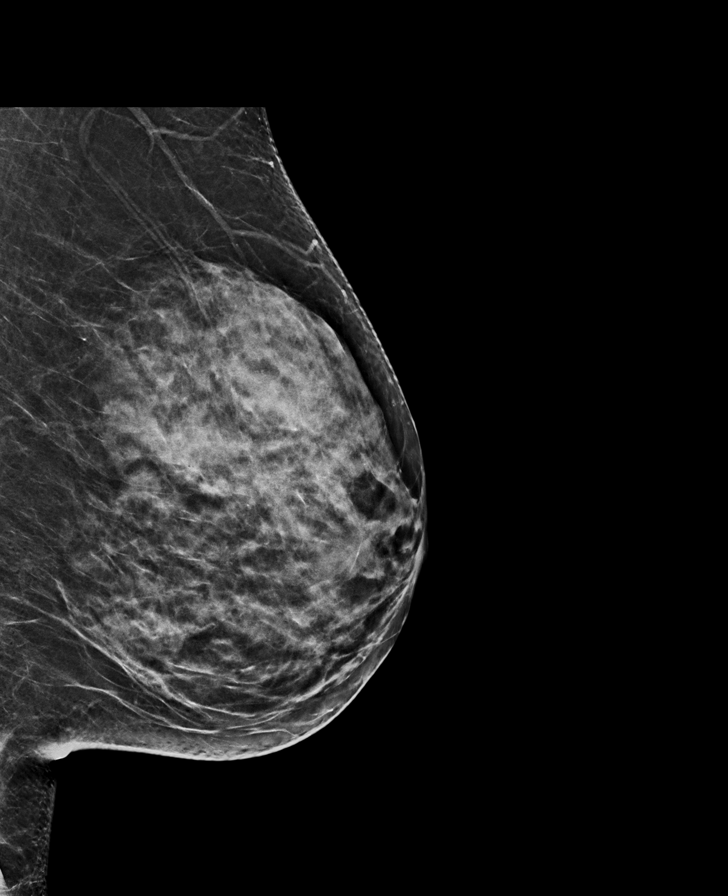

[R CC synth-2D]
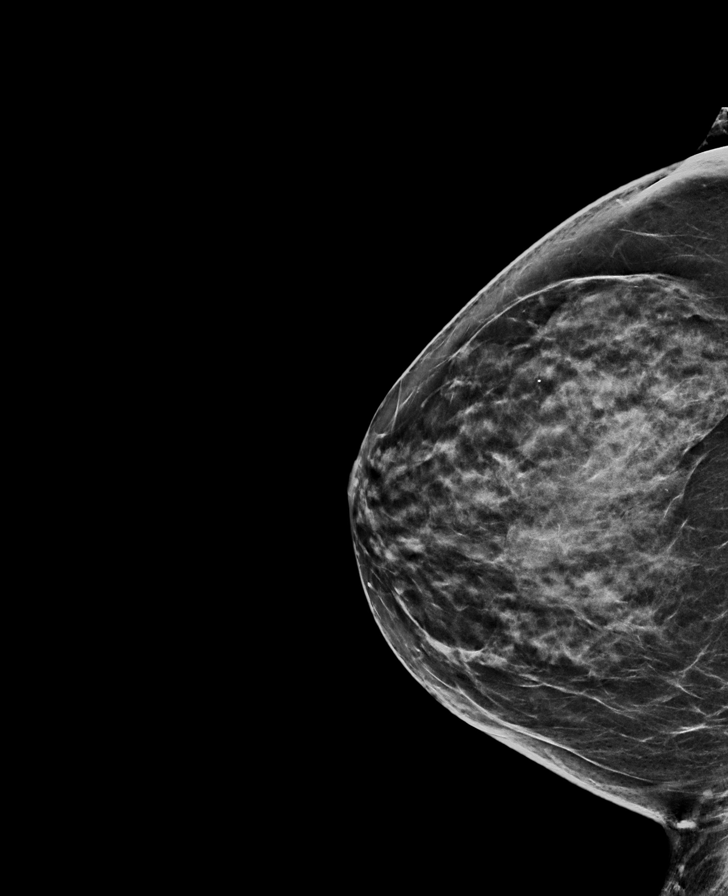

[R MLO synth-2D]
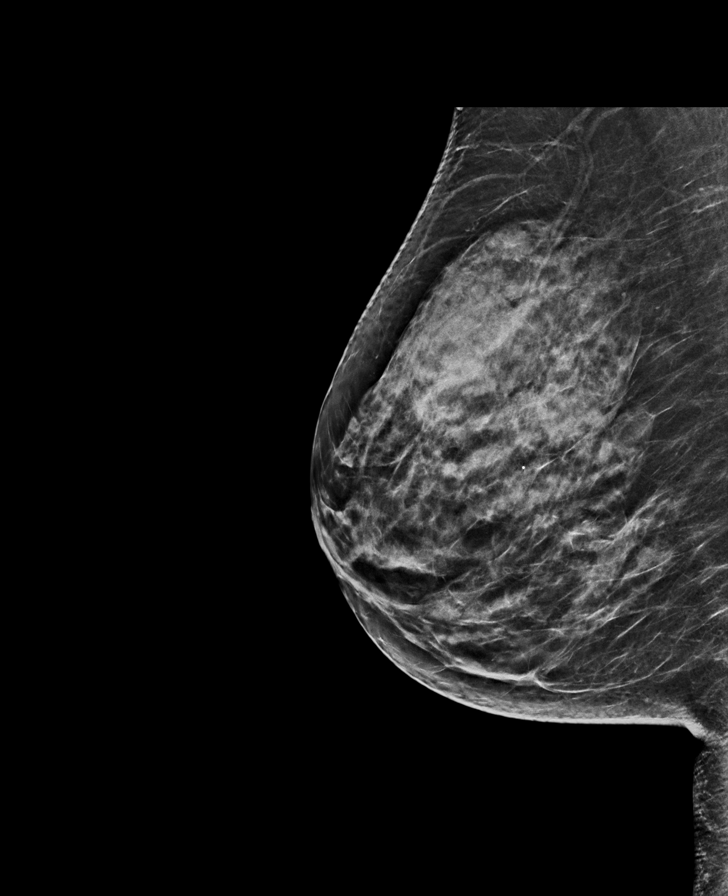

[L CC synth-2D]
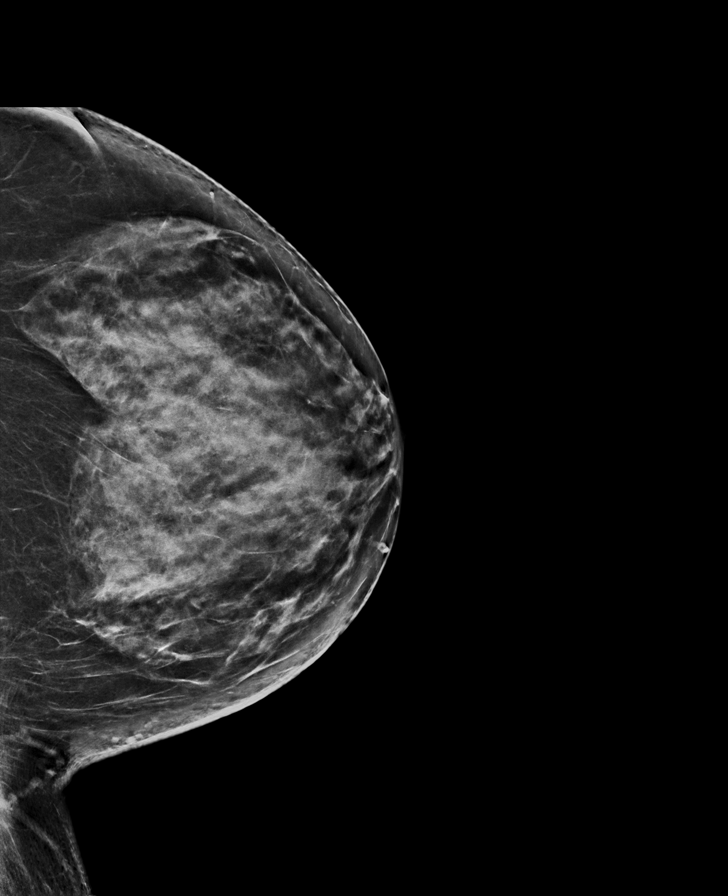

[L MLO tomo · tomo slice 34/67.0]
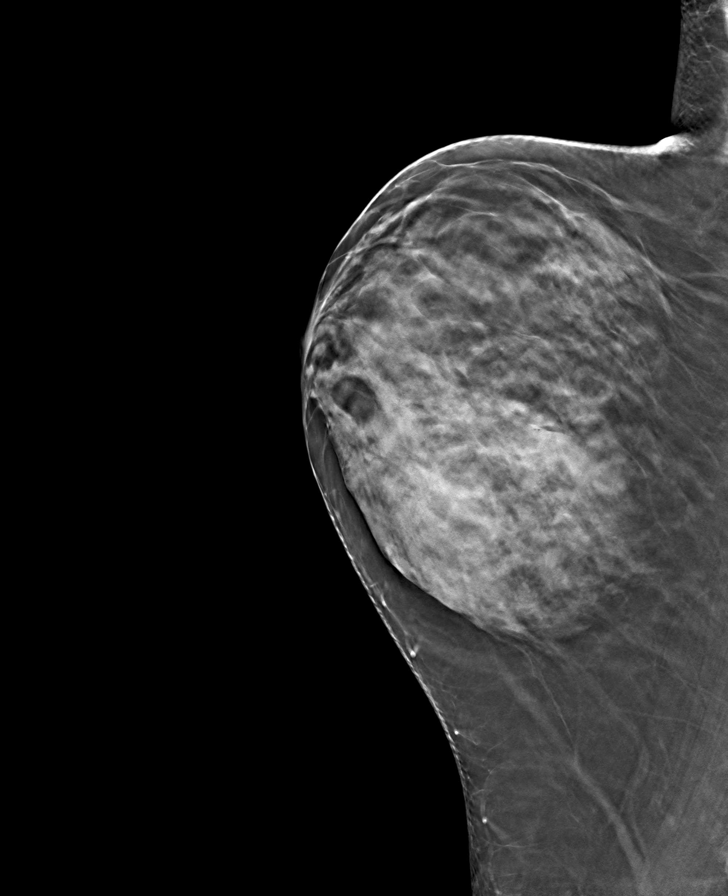

[R CC tomo · tomo slice 35/70.0]
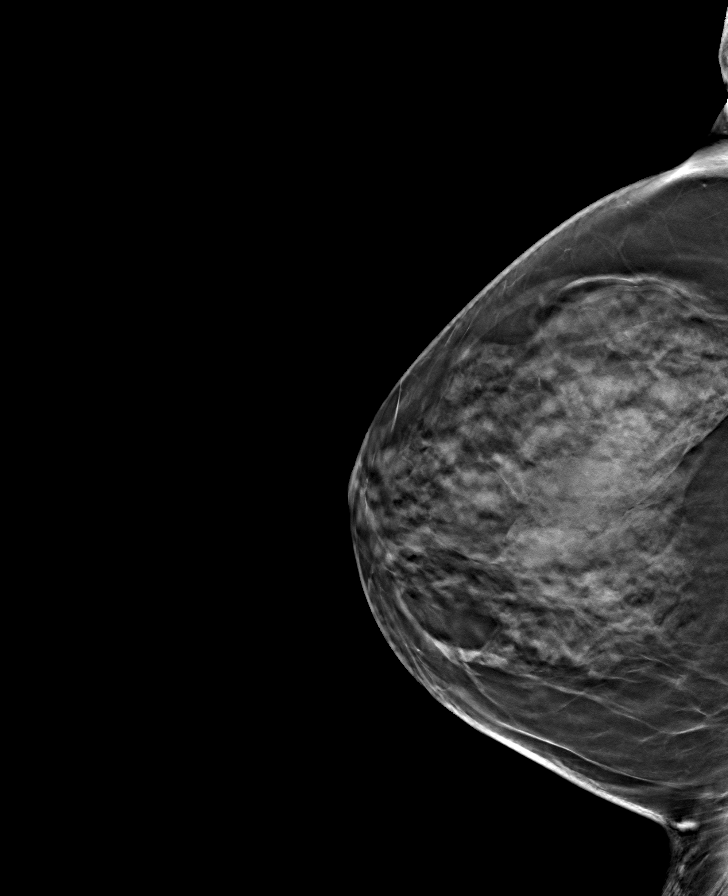

[R MLO tomo · tomo slice 35/68.0]
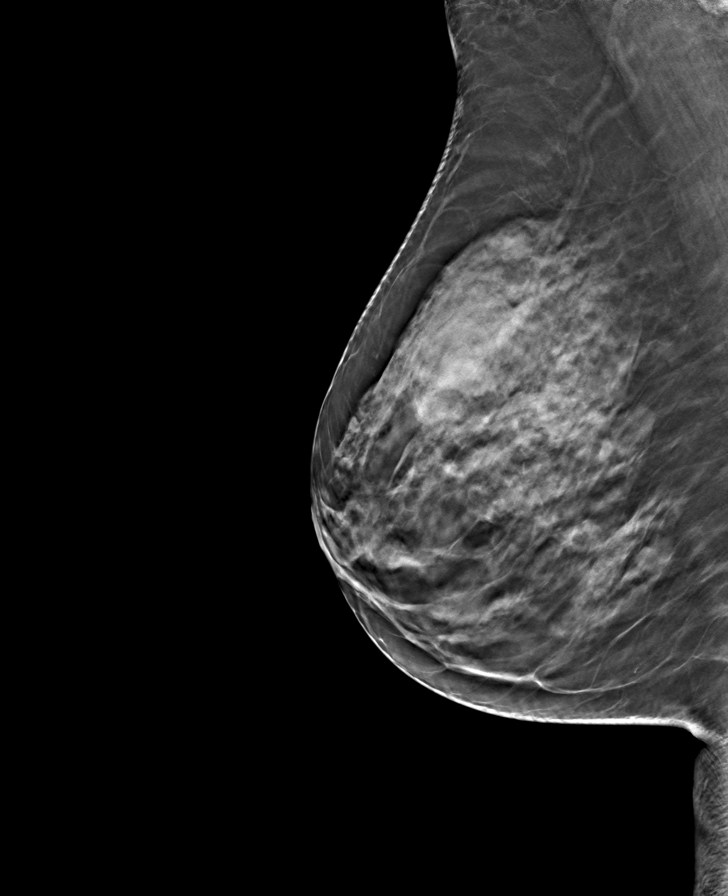

[L CC tomo · tomo slice 39/78.0]
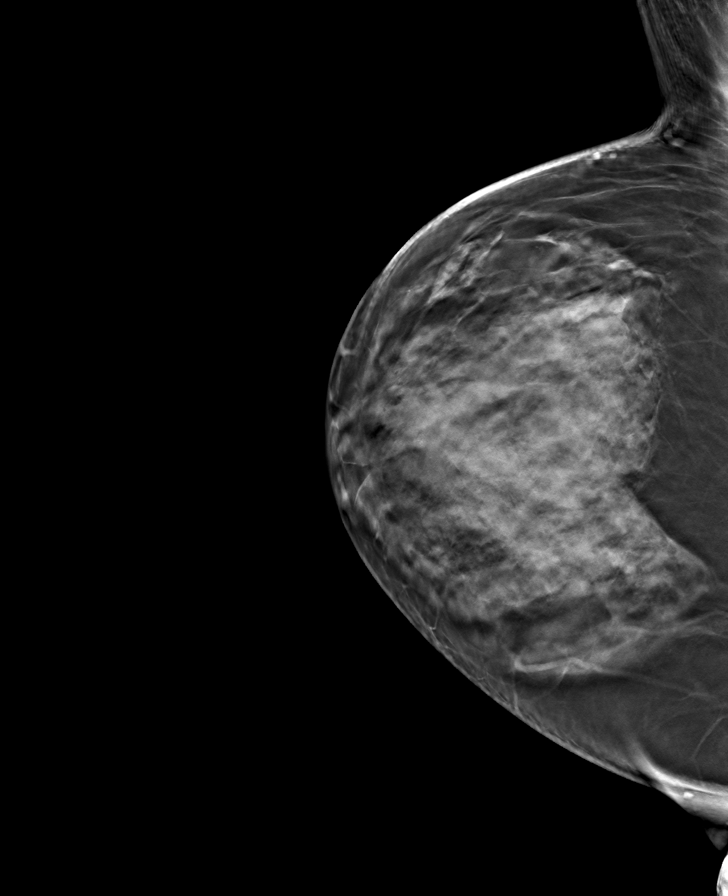

[8 of 24 positions shown; findings below may reference images not displayed]

ACR Breast Density Category c: The breast tissue is heterogeneously
dense, which may obscure small masses.
FINDINGS: There are no findings suspicious for malignancy. Images were
processed with CAD.
IMPRESSION: No mammographic evidence of malignancy. A result letter of this
screening mammogram will be mailed directly to the patient.

RECOMMENDATION:
Screening mammogram in one year. (Code:[id number])

BI-RADS CATEGORY  1: Negative.

## 2019-12-09 ENCOUNTER — Other Ambulatory Visit: Payer: Self-pay | Admitting: Family Medicine

## 2019-12-09 ENCOUNTER — Ambulatory Visit (INDEPENDENT_AMBULATORY_CARE_PROVIDER_SITE_OTHER): Payer: Medicare Other

## 2019-12-09 VITALS — Ht 62.0 in | Wt 164.0 lb

## 2019-12-09 DIAGNOSIS — Z1231 Encounter for screening mammogram for malignant neoplasm of breast: Secondary | ICD-10-CM

## 2019-12-09 DIAGNOSIS — Z0000 Encounter for general adult medical examination without abnormal findings: Secondary | ICD-10-CM

## 2019-12-09 NOTE — Progress Notes (Signed)
Subjective:   Beth Hart is a 69 y.o. female who presents for Medicare Annual (Subsequent) preventive examination.  Review of Systems    No ROS.  Medicare Wellness Virtual Visit.   Cardiac Risk Factors include: advanced age (>62men, >38 women)     Objective:    Today's Vitals   12/09/19 1233  Weight: 164 lb (74.4 kg)  Height: 5\' 2"  (1.575 m)   Body mass index is 30 kg/m.  Advanced Directives 12/09/2019 12/08/2018 12/05/2017  Does Patient Have a Medical Advance Directive? Yes Yes Yes  Type of Paramedic of [place];Living will [place];Living will  Does patient want to make changes to medical advance directive? No - Patient declined No - Patient declined No - Patient declined  Copy of [place] in Chart? No - copy requested No - copy requested No - copy requested    Current Medications (verified) Outpatient Encounter Medications as of 12/09/2019  Medication Sig  . CALCIUM-VITAMINS C & D PO Take by mouth.  . citalopram (CELEXA) 20 MG tablet TAKE 0.5 TABLETS (10 MG TOTAL) BY MOUTH.  . Multiple Vitamin (MULTIVITAMIN) tablet Take 1 tablet by mouth daily.   No facility-administered encounter medications on file as of 12/09/2019.    Allergies (verified) Patient has no known allergies.   History: Past Medical History:  Diagnosis Date  . Anemia   . Arthritis   . Asthma    as a child  . Chicken pox   . Colon polyps 2012  . Depression   . Hemorrhoids    Past Surgical History:  Procedure Laterality Date  . BREAST CYST ASPIRATION Left 90s  . BREAST SURGERY  2007   aspiration  . CARPAL TUNNEL RELEASE  2010  . REFRACTIVE SURGERY    . TONSILLECTOMY     Family History  Problem Relation Age of Onset  . Alzheimer's disease Mother   . Lung cancer Father   . Breast cancer Neg Hx    Social History   Socioeconomic History  . Marital status: Married    Spouse name: Not on  file  . Number of children: Not on file  . Years of education: Not on file  . Highest education level: Not on file  Occupational History  . Not on file  Tobacco Use  . Smoking status: Never Smoker  . Smokeless tobacco: Never Used  Vaping Use  . Vaping Use: Never used  Substance and Sexual Activity  . Alcohol use: No  . Drug use: No  . Sexual activity: Yes  Other Topics Concern  . Not on file  Social History Narrative  . Not on file   Social Determinants of Health   Financial Resource Strain: Low Risk   . Difficulty of Paying Living Expenses: Not hard at all  Food Insecurity: No Food Insecurity  . Worried About Charity fundraiser in the Last Year: Never true  . Ran Out of Food in the Last Year: Never true  Transportation Needs: No Transportation Needs  . Lack of Transportation (Medical): No  . Lack of Transportation (Non-Medical): No  Physical Activity:   . Days of Exercise per Week:   . Minutes of Exercise per Session:   Stress: No Stress Concern Present  . Feeling of Stress : Not at all  Social Connections:   . Frequency of Communication with Friends and Family:   . Frequency of Social Gatherings with Friends and Family:   .  Attends Religious Services:   . Active Member of Clubs or Organizations:   . Attends Archivist Meetings:   Marland Kitchen Marital Status:     Tobacco Counseling Counseling given: Not Answered   Clinical Intake:  Pre-visit preparation completed: Yes        Diabetes: No  How often do you need to have someone help you when you read instructions, pamphlets, or other written materials from your doctor or pharmacy?: 1 - Never  Interpreter Needed?: No      Activities of Daily Living In your present state of health, do you have any difficulty performing the following activities: 12/09/2019  Hearing? N  Vision? N  Difficulty concentrating or making decisions? N  Walking or climbing stairs? N  Dressing or bathing? N  Doing errands,  shopping? N  Preparing Food and eating ? N  Using the Toilet? N  In the past six months, have you accidently leaked urine? N  Do you have problems with loss of bowel control? N  Managing your Medications? N  Managing your Finances? N  Housekeeping or managing your Housekeeping? N  Some recent data might be hidden    Patient Care Team: Leone Haven, MD as PCP - General (Family Medicine)  Indicate any recent Medical Services you may have received from other than [facility] providers in the past year (date may be approximate).     Assessment:   This is a routine wellness examination for Beth Hart.  I connected with Beth Hart today by telephone and verified that I am speaking with the correct person using two identifiers. Location patient: home Location provider: work Persons participating in the virtual visit: patient, Marine scientist.    I discussed the limitations, risks, security and privacy concerns of performing an evaluation and management service by telephone and the availability of in person appointments. The patient expressed understanding and verbally consented to this telephonic visit.    Interactive audio and video telecommunications were attempted between this provider and patient, however failed, due to patient having technical difficulties OR patient did not have access to video capability.  We continued and completed visit with audio only.  Some vital signs may be absent or patient reported.   Hearing/Vision screen  Hearing Screening   125Hz  250Hz  500Hz  1000Hz  2000Hz  3000Hz  4000Hz  6000Hz  8000Hz   Right ear:           Left ear:           Comments: Patient is able to hear conversational tones without difficulty.  No issues reported.  Vision Screening Comments: Wears corrective lenses Visual acuity not assessed, virtual visit.  They have seen their ophthalmologist.    Dietary issues and exercise activities discussed: Current Exercise Habits: Home exercise routine, Intensity: Mild   Healthy diet Good water intake Caffeine- 1 diet coke daily  Goals    . Follow up with Primary Care Provider     Keep all routine maintenance appointments      Depression Screen PHQ 2/9 Scores 12/09/2019 06/15/2019 12/10/2018 12/08/2018 12/05/2017 11/23/2015  PHQ - 2 Score 0 0 0 1 0 0    Fall Risk Fall Risk  12/09/2019 06/15/2019 12/10/2018 12/08/2018 11/17/2018  Falls in the past year? 0 0 0 0 0  Number falls in past yr: 0 0 0 - -  Follow up Falls evaluation completed Falls evaluation completed - - -   Handrails in use when climbing stairs? Yes  Home free of loose throw rugs in walkways, pet beds, electrical cords,  etc? Yes  Adequate lighting in your home to reduce risk of falls? Yes   ASSISTIVE DEVICES UTILIZED TO PREVENT FALLS:  Life alert? No  Use of a cane, walker or w/c? No  Grab bars in the bathroom? No  Shower chair or bench in shower? Yes  Elevated toilet seat or a handicapped toilet? Yes   TIMED UP AND GO:  Was the test performed? No . Virtual visit.  Cognitive Function: Patient is alert and oriented x3.  Denies difficulty with memory loss, focusing, making decisions.   MMSE - Mini Mental State Exam 12/09/2019 12/05/2017  Not completed: Unable to complete -  Orientation to time - 5  Orientation to Place - 5  Registration - 3  Attention/ Calculation - 5  Recall - 3  Language- name 2 objects - 2  Language- repeat - 1  Language- follow 3 step command - 3  Language- read & follow direction - 1  Write a sentence - 1  Copy design - 1  Total score - 30     6CIT Screen 12/08/2018  What Year? 0 points  What month? 0 points  What time? 0 points  Count back from 20 0 points  Months in reverse 0 points  Repeat phrase 0 points  Total Score 0    Immunizations Immunization History  Administered Date(s) Administered  . Influenza, High Dose Seasonal PF 02/07/2017, 02/06/2018  . Influenza-Unspecified 01/28/2018, 02/14/2019  . Pneumococcal Conjugate-13 02/12/2017  .  Pneumococcal Polysaccharide-23 11/23/2015  . Tdap 05/28/2014  . Zoster Recombinat (Shingrix) 04/25/2018, 09/05/2018    Health Maintenance Health Maintenance  Topic Date Due  . INFLUENZA VACCINE  11/29/2019  . COVID-19 Vaccine (1) 04/29/2020 (Originally 11/13/1962)  . MAMMOGRAM  01/07/2021  . COLONOSCOPY  11/23/2022  . TETANUS/TDAP  05/28/2024  . DEXA SCAN  Completed  . PNA vac Low Risk Adult  Completed   Dental Screening: Recommended annual dental exams for proper oral hygiene.  Community Resource Referral / Chronic Care Management: CRR required this visit?  No   CCM required this visit?  No      Plan:   Keep all routine maintenance appointments.   Follow up 01/15/20 @ 10:00  I have personally reviewed and noted the following in the patient's chart:   . Medical and social history . Use of alcohol, tobacco or illicit drugs  . Current medications and supplements . Functional ability and status . Nutritional status . Physical activity . Advanced directives . List of other physicians . Hospitalizations, surgeries, and ER visits in previous 12 months . Vitals . Screenings to include cognitive, depression, and falls . Referrals and appointments  In addition, I have reviewed and discussed with patient certain preventive protocols, quality metrics, and best practice recommendations. A written personalized care plan for preventive services as well as general preventive health recommendations were provided to patient via mychart.     Varney Biles, LPN   09/14/15

## 2019-12-09 NOTE — Patient Instructions (Addendum)
Beth Hart , Thank you for taking time to come for your Medicare Wellness Visit. I appreciate your ongoing commitment to your health goals. Please review the following plan we discussed and let me know if I can assist you in the future.   These are the goals we discussed: Goals    . Follow up with Primary Care Provider     Keep all routine maintenance appointments       This is a list of the screening recommended for you and due dates:  Health Maintenance  Topic Date Due  . Flu Shot  11/29/2019  . COVID-19 Vaccine (1) 04/29/2020*  . Mammogram  01/07/2021  . Colon Cancer Screening  11/23/2022  . Tetanus Vaccine  05/28/2024  . DEXA scan (bone density measurement)  Completed  . Pneumonia vaccines  Completed  *Topic was postponed. The date shown is not the original due date.    Immunizations Immunization History  Administered Date(s) Administered  . Influenza, High Dose Seasonal PF 02/07/2017  . Influenza-Unspecified 01/28/2018  . Pneumococcal Conjugate-13 02/12/2017  . Pneumococcal Polysaccharide-23 11/23/2015  . Tdap 05/28/2014   Keep all routine maintenance appointments.   Follow up 01/15/20 @ 10:00  Advanced directives: End of life planning; Advance aging; Advanced directives discussed.  Copy of current HCPOA/Living Will requested.    Conditions/risks identified: none new  Follow up in one year for your annual wellness visit.   Preventive Care 69 Years and Older, Female Preventive care refers to lifestyle choices and visits with your health care provider that can promote health and wellness. What does preventive care include?  A yearly physical exam. This is also called an annual well check.  Dental exams once or twice a year.  Routine eye exams. Ask your health care provider how often you should have your eyes checked.  Personal lifestyle choices, including:  Daily care of your teeth and gums.  Regular physical activity.  Eating a healthy  diet.  Avoiding tobacco and drug use.  Limiting alcohol use.  Practicing safe sex.  Taking low-dose aspirin every day.  Taking vitamin and mineral supplements as recommended by your health care provider. What happens during an annual well check? The services and screenings done by your health care provider during your annual well check will depend on your age, overall health, lifestyle risk factors, and family history of disease. Counseling  Your health care provider may ask you questions about your:  Alcohol use.  Tobacco use.  Drug use.  Emotional well-being.  Home and relationship well-being.  Sexual activity.  Eating habits.  History of falls.  Memory and ability to understand (cognition).  Work and work Statistician.  Reproductive health. Screening  You may have the following tests or measurements:  Height, weight, and BMI.  Blood pressure.  Lipid and cholesterol levels. These may be checked every 5 years, or more frequently if you are over 34 years old.  Skin check.  Lung cancer screening. You may have this screening every year starting at age 76 if you have a 30-pack-year history of smoking and currently smoke or have quit within the past 15 years.  Fecal occult blood test (FOBT) of the stool. You may have this test every year starting at age 48.  Flexible sigmoidoscopy or colonoscopy. You may have a sigmoidoscopy every 5 years or a colonoscopy every 10 years starting at age 53.  Hepatitis C blood test.  Hepatitis B blood test.  Sexually transmitted disease (STD) testing.  Diabetes screening. This  is done by checking your blood sugar (glucose) after you have not eaten for a while (fasting). You may have this done every 1-3 years.  Bone density scan. This is done to screen for osteoporosis. You may have this done starting at age 18.  Mammogram. This may be done every 1-2 years. Talk to your health care provider about how often you should have  regular mammograms. Talk with your health care provider about your test results, treatment options, and if necessary, the need for more tests. Vaccines  Your health care provider may recommend certain vaccines, such as:  Influenza vaccine. This is recommended every year.  Tetanus, diphtheria, and acellular pertussis (Tdap, Td) vaccine. You may need a Td booster every 10 years.  Zoster vaccine. You may need this after age 17.  Pneumococcal 13-valent conjugate (PCV13) vaccine. One dose is recommended after age 53.  Pneumococcal polysaccharide (PPSV23) vaccine. One dose is recommended after age 47. Talk to your health care provider about which screenings and vaccines you need and how often you need them. This information is not intended to replace advice given to you by your health care provider. Make sure you discuss any questions you have with your health care provider. Document Released: 05/13/2015 Document Revised: 01/04/2016 Document Reviewed: 02/15/2015 Elsevier Interactive Patient Education  2017 [place] Prevention in the Home Falls can cause injuries. They can happen to people of all ages. There are many things you can do to make your home safe and to help prevent falls. What can I do on the outside of my home?  Regularly fix the edges of walkways and driveways and fix any cracks.  Remove anything that might make you trip as you walk through a door, such as a raised step or threshold.  Trim any bushes or trees on the path to your home.  Use bright outdoor lighting.  Clear any walking paths of anything that might make someone trip, such as rocks or tools.  Regularly check to see if handrails are loose or broken. Make sure that both sides of any steps have handrails.  Any raised decks and porches should have guardrails on the edges.  Have any leaves, snow, or ice cleared regularly.  Use sand or salt on walking paths during winter.  Clean up any spills in your  garage right away. This includes oil or grease spills. What can I do in the bathroom?  Use night lights.  Install grab bars by the toilet and in the tub and shower. Do not use towel bars as grab bars.  Use non-skid mats or decals in the tub or shower.  If you need to sit down in the shower, use a plastic, non-slip stool.  Keep the floor dry. Clean up any water that spills on the floor as soon as it happens.  Remove soap buildup in the tub or shower regularly.  Attach bath mats securely with double-sided non-slip rug tape.  Do not have throw rugs and other things on the floor that can make you trip. What can I do in the bedroom?  Use night lights.  Make sure that you have a light by your bed that is easy to reach.  Do not use any sheets or blankets that are too big for your bed. They should not hang down onto the floor.  Have a firm chair that has side arms. You can use this for support while you get dressed.  Do not have throw rugs and other  things on the floor that can make you trip. What can I do in the kitchen?  Clean up any spills right away.  Avoid walking on wet floors.  Keep items that you use a lot in easy-to-reach places.  If you need to reach something above you, use a strong step stool that has a grab bar.  Keep electrical cords out of the way.  Do not use floor polish or wax that makes floors slippery. If you must use wax, use non-skid floor wax.  Do not have throw rugs and other things on the floor that can make you trip. What can I do with my stairs?  Do not leave any items on the stairs.  Make sure that there are handrails on both sides of the stairs and use them. Fix handrails that are broken or loose. Make sure that handrails are as long as the stairways.  Check any carpeting to make sure that it is firmly attached to the stairs. Fix any carpet that is loose or worn.  Avoid having throw rugs at the top or bottom of the stairs. If you do have throw  rugs, attach them to the floor with carpet tape.  Make sure that you have a light switch at the top of the stairs and the bottom of the stairs. If you do not have them, ask someone to add them for you. What else can I do to help prevent falls?  Wear shoes that:  Do not have high heels.  Have rubber bottoms.  Are comfortable and fit you well.  Are closed at the toe. Do not wear sandals.  If you use a stepladder:  Make sure that it is fully opened. Do not climb a closed stepladder.  Make sure that both sides of the stepladder are locked into place.  Ask someone to hold it for you, if possible.  Clearly mark and make sure that you can see:  Any grab bars or handrails.  First and last steps.  Where the edge of each step is.  Use tools that help you move around (mobility aids) if they are needed. These include:  Canes.  Walkers.  Scooters.  Crutches.  Turn on the lights when you go into a dark area. Replace any light bulbs as soon as they burn out.  Set up your furniture so you have a clear path. Avoid moving your furniture around.  If any of your floors are uneven, fix them.  If there are any pets around you, be aware of where they are.  Review your medicines with your doctor. Some medicines can make you feel dizzy. This can increase your chance of falling. Ask your doctor what other things that you can do to help prevent falls. This information is not intended to replace advice given to you by your health care provider. Make sure you discuss any questions you have with your health care provider. Document Released: 02/10/2009 Document Revised: 09/22/2015 Document Reviewed: 05/21/2014 Elsevier Interactive Patient Education  2017 [employer].

## 2019-12-31 DIAGNOSIS — Z23 Encounter for immunization: Secondary | ICD-10-CM | POA: Diagnosis not present

## 2020-01-13 ENCOUNTER — Other Ambulatory Visit: Payer: Self-pay

## 2020-01-13 ENCOUNTER — Ambulatory Visit
Admission: RE | Admit: 2020-01-13 | Discharge: 2020-01-13 | Disposition: A | Discharge status: Home / Self Care | Payer: Medicare Other | Source: Ambulatory Visit | Attending: Family Medicine | Admitting: Family Medicine

## 2020-01-13 DIAGNOSIS — Z1231 Encounter for screening mammogram for malignant neoplasm of breast: Secondary | ICD-10-CM | POA: Diagnosis not present

## 2020-01-15 ENCOUNTER — Ambulatory Visit: Payer: Medicare Other | Admitting: Family Medicine

## 2020-01-21 DIAGNOSIS — Z23 Encounter for immunization: Secondary | ICD-10-CM | POA: Diagnosis not present

## 2020-01-25 ENCOUNTER — Other Ambulatory Visit: Payer: Self-pay

## 2020-01-25 ENCOUNTER — Encounter: Payer: Self-pay | Admitting: Family Medicine

## 2020-01-25 ENCOUNTER — Ambulatory Visit (INDEPENDENT_AMBULATORY_CARE_PROVIDER_SITE_OTHER): Payer: Medicare Other | Admitting: Family Medicine

## 2020-01-25 VITALS — BP 120/80 | HR 77 | Temp 98.3°F | Ht 62.0 in | Wt 162.8 lb

## 2020-01-25 DIAGNOSIS — F330 Major depressive disorder, recurrent, mild: Secondary | ICD-10-CM

## 2020-01-25 DIAGNOSIS — R413 Other amnesia: Secondary | ICD-10-CM | POA: Diagnosis not present

## 2020-01-25 DIAGNOSIS — Z23 Encounter for immunization: Secondary | ICD-10-CM

## 2020-01-25 DIAGNOSIS — E785 Hyperlipidemia, unspecified: Secondary | ICD-10-CM | POA: Diagnosis not present

## 2020-01-25 LAB — COMPREHENSIVE METABOLIC PANEL
ALT: 15 U/L (ref 0–35)
AST: 20 U/L (ref 0–37)
Albumin: 4.3 g/dL (ref 3.5–5.2)
Alkaline Phosphatase: 78 U/L (ref 39–117)
BUN: 14 mg/dL (ref 6–23)
CO2: 26 mEq/L (ref 19–32)
Calcium: 9.5 mg/dL (ref 8.4–10.5)
Chloride: 105 mEq/L (ref 96–112)
Creatinine, Ser: 1.15 mg/dL (ref 0.40–1.20)
GFR: 46.76 mL/min — ABNORMAL LOW (ref >60.00)
Glucose, Bld: 89 mg/dL (ref 70–99)
Potassium: 4.7 mEq/L (ref 3.5–5.1)
Sodium: 139 mEq/L (ref 135–145)
Total Bilirubin: 0.4 mg/dL (ref 0.2–1.2)
Total Protein: 6.8 g/dL (ref 6.0–8.3)

## 2020-01-25 LAB — LIPID PANEL
Cholesterol: 188 mg/dL (ref 0–200)
HDL: 73.1 mg/dL (ref >39.00)
LDL Cholesterol: 97 mg/dL (ref 0–99)
NonHDL: 114.97
Total CHOL/HDL Ratio: 3
Triglycerides: 92 mg/dL (ref 0.0–149.0)
VLDL: 18.4 mg/dL (ref 0.0–40.0)

## 2020-01-25 LAB — TSH: TSH: 2.4 u[IU]/mL (ref 0.35–4.50)

## 2020-01-25 LAB — VITAMIN B12: Vitamin B-12: 357 pg/mL (ref 211–911)

## 2020-01-25 NOTE — Assessment & Plan Note (Addendum)
Adequately controlled.  Continue Celexa 20 mg once daily.

## 2020-01-25 NOTE — Patient Instructions (Signed)
Nice to see you. We will get lab work today and contact you with the results. Please monitor your memory and if it starts to worsen please let us know. Please continue to work on diet and exercise.

## 2020-01-25 NOTE — Progress Notes (Signed)
Beth Rumps, MD Phone: [phone]  TECORA Hart is a 69 y.o. female who presents today for follow-up.  Depression: Patient notes this is adequately controlled.  Anytime she feels like she is slipping into depression she will do some volunteer work and that helps.  She is on Celexa and that has been beneficial.  No SI.  Depression screen [facility] 2/9 01/25/2020 12/09/2019 06/15/2019 12/10/2018 12/08/2018  Decreased Interest 0 0 0 0 0  Down, Depressed, Hopeless 0 0 0 0 1  PHQ - 2 Score 0 0 0 0 1  Altered sleeping 1 - - - -  Tired, decreased energy 1 - - - -  Change in appetite 0 - - - -  Feeling bad or failure about yourself  0 - - - -  Trouble concentrating 3 - - - -  Moving slowly or fidgety/restless 1 - - - -  Suicidal thoughts 0 - - - -  PHQ-9 Score 6 - - - -  Difficult doing work/chores Not difficult at all - - - -     Memory difficulty: Notes this has been going on a little bit recently.  She notes when she is under stress her thinking is just a little bit off compared to previously.  She has trouble recalling certain words.  Does note a history of ADD so she is a little scatterbrained.  She has not gotten lost while driving though she does note she has turned the wrong direction when she is not thinking about where she is going.  Her mother had Alzheimer's.  She does well overall otherwise.  She does run her household has no issues with that.  Hyperlipidemia: Is quite active with her grandchildren.  Does gardening as well.  No other exercise.  She is trying to eat more vegetables and less meat.  She does avoid sugar.  Social History   Tobacco Use  Smoking Status Never Smoker  Smokeless Tobacco Never Used     ROS see history of present illness  Objective  Physical Exam Vitals:   01/25/20 0824  BP: 120/80  Pulse: 77  Temp: 98.3 F (36.8 C)  SpO2: 98%    BP Readings from Last 3 Encounters:  01/25/20 120/80  12/10/18 123/73  12/05/17 122/72   Wt Readings from  Last 3 Encounters:  01/25/20 162 lb 12.8 oz (73.8 kg)  12/09/19 164 lb (74.4 kg)  06/15/19 164 lb (74.4 kg)    Physical Exam Constitutional:      General: She is not in acute distress.    Appearance: She is not diaphoretic.  Cardiovascular:     Rate and Rhythm: Normal rate and regular rhythm.     Heart sounds: Normal heart sounds.  Pulmonary:     Effort: Pulmonary effort is normal.     Breath sounds: Normal breath sounds.  Musculoskeletal:     Right lower leg: No edema.     Left lower leg: No edema.  Skin:    General: Skin is warm and dry.  Neurological:     Mental Status: She is alert.     Comments: 5/5 mini cog      Assessment/Plan: Please see individual problem list.  Depression Adequately controlled.  Continue Celexa 20 mg once daily.  Memory difficulty Some mild issues with her memory though she did pass the mini cog test which would argue against any type of cognitive impairment or dementia.  We will check B12 and TSH to evaluate those.  She will  otherwise monitor and if she has any worsening symptoms she will let us know.  Hyperlipidemia She will continue diet and exercise.  Check lipid panel and CMP.   Orders Placed This Encounter  Procedures  . Flu Vaccine QUAD High Dose(Fluad)  . Comp Met (CMET)  . Lipid panel  . TSH  . B12    No orders of the defined types were placed in this encounter.   Beth Hart was seen today for follow-up.  Diagnoses and all orders for this visit:  Mild episode of recurrent major depressive disorder ([place])  Need for immunization against influenza -     Flu Vaccine QUAD High Dose(Fluad)  Memory difficulty -     TSH -     B12  Hyperlipidemia, unspecified hyperlipidemia type -     Comp Met (CMET) -     Lipid panel     This visit occurred during the SARS-CoV-2 public health emergency.  Safety protocols were in place, including screening questions prior to the visit, additional usage of staff PPE, and extensive cleaning  of exam room while observing appropriate contact time as indicated for disinfecting solutions.    Beth Rumps, MD [place]

## 2020-01-25 NOTE — Assessment & Plan Note (Signed)
She will continue diet and exercise.  Check lipid panel and CMP.

## 2020-01-25 NOTE — Assessment & Plan Note (Signed)
Some mild issues with her memory though she did pass the mini cog test which would argue against any type of cognitive impairment or dementia.  We will check B12 and TSH to evaluate those.  She will otherwise monitor and if she has any worsening symptoms she will let us know.

## 2020-02-04 ENCOUNTER — Other Ambulatory Visit: Payer: Self-pay

## 2020-02-04 ENCOUNTER — Ambulatory Visit (INDEPENDENT_AMBULATORY_CARE_PROVIDER_SITE_OTHER): Payer: Medicare Other | Admitting: Dermatology

## 2020-02-04 DIAGNOSIS — L720 Epidermal cyst: Secondary | ICD-10-CM

## 2020-02-04 NOTE — Progress Notes (Signed)
   Follow-Up Visit   Subjective  Beth Hart is a 68 y.o. female who presents for the following: Other (Lump on back right at her brastrap x more than 1 year. It is under the skin.).   The following portions of the chart were reviewed this encounter and updated as appropriate:  Tobacco  Allergies  Meds  Problems  Med Hx  Surg Hx  Fam Hx     Review of Systems:  No other skin or systemic complaints except as noted in HPI or Assessment and Plan.  Objective  Well appearing patient in no apparent distress; mood and affect are within normal limits.  A focused examination was performed including back. Relevant physical exam findings are noted in the Assessment and Plan.  Objective  Right mid back paraspinal: 1.2 cm cystic papule   Assessment & Plan  Epidermal inclusion cyst Right mid back paraspinal  Cyst with symptoms and/or recent change.  Discussed surgical excision to remove, including resulting scar and possible recurrence.  Patient will schedule for surgery. Pre-op information given.   Return for cyst excision .  I, Ashok Cordia, CMA, am acting as scribe for Sarina Ser, MD .  Documentation: I have reviewed the above documentation for accuracy and completeness, and I agree with the above.  Sarina Ser, MD

## 2020-02-04 NOTE — Patient Instructions (Signed)
Pre-op information given.  Recommend patient stop taking blood thinner medication (i.e. aspirin) 14 days prior to procedure, or as recommended by PCP/cardiologist.  

## 2020-02-05 ENCOUNTER — Encounter: Payer: Self-pay | Admitting: Dermatology

## 2020-03-29 ENCOUNTER — Telehealth: Payer: Self-pay

## 2020-03-29 ENCOUNTER — Other Ambulatory Visit: Payer: Self-pay

## 2020-03-29 ENCOUNTER — Ambulatory Visit (INDEPENDENT_AMBULATORY_CARE_PROVIDER_SITE_OTHER): Payer: Medicare Other | Admitting: Dermatology

## 2020-03-29 DIAGNOSIS — L720 Epidermal cyst: Secondary | ICD-10-CM

## 2020-03-29 DIAGNOSIS — D492 Neoplasm of unspecified behavior of bone, soft tissue, and skin: Secondary | ICD-10-CM

## 2020-03-29 MED ORDER — MUPIROCIN 2 % EX OINT: 1.0000 "application " | TOPICAL_OINTMENT | Freq: Every day | CUTANEOUS | 0 refills | Status: DC

## 2020-03-29 NOTE — Patient Instructions (Signed)

## 2020-03-29 NOTE — Progress Notes (Signed)
   Follow-Up Visit   Subjective  Beth Hart is a 69 y.o. female who presents for the following: Cyst (R mid back paraspinal - patient is here today for excision ).  The following portions of the chart were reviewed this encounter and updated as appropriate:  Tobacco  Allergies  Meds  Problems  Med Hx  Surg Hx  Fam Hx     Review of Systems:  No other skin or systemic complaints except as noted in HPI or Assessment and Plan.  Objective  Well appearing patient in no apparent distress; mood and affect are within normal limits.  A focused examination was performed including back. Relevant physical exam findings are noted in the Assessment and Plan.  Objective  R mid back paraspinal: 2.5 cm Firm SQ nodule    Assessment & Plan  Neoplasm of skin R mid back paraspinal  Skin excision  Lesion length (cm):  2.5 Lesion width (cm):  2.5 Margin per side (cm):  0 Total excision diameter (cm):  2.5 Informed consent: discussed and consent obtained   Timeout: patient name, date of birth, surgical site, and procedure verified   Procedure prep:  Patient was prepped and draped in usual sterile fashion Prep type:  Isopropyl alcohol and povidone-iodine Anesthesia: the lesion was anesthetized in a standard fashion   Anesthetic:  1% lidocaine w/ epinephrine 1-100,000 buffered w/ 8.4% NaHCO3 (10.0 cc) Instrument used: #15 blade   Hemostasis achieved with: pressure   Hemostasis achieved with comment:  Electrocautery Outcome: patient tolerated procedure well with no complications   Post-procedure details: sterile dressing applied and wound care instructions given   Dressing type: bandage and pressure dressing (Mupirocin)    Skin repair Complexity:  Complex Final length (cm):  3.5 Informed consent: discussed and consent obtained   Timeout: patient name, date of birth, surgical site, and procedure verified   Procedure prep:  Patient was prepped and draped in usual sterile fashion Prep  type:  Povidone-iodine Anesthesia: the lesion was anesthetized in a standard fashion   Reason for type of repair: reduce tension to allow closure, reduce the risk of dehiscence, infection, and necrosis, reduce subcutaneous dead space and avoid a hematoma, allow closure of the large defect, preserve normal anatomy, preserve normal anatomical and functional relationships and enhance both functionality and cosmetic results   Undermining comment:  2.5 cm Subcutaneous layers (deep stitches):  Suture size:  2-0 Suture type: Vicryl (polyglactin 910)   Fine/surface layer approximation (top stitches):  Suture size:  3-0 Suture type: nylon   Stitches: simple running   Suture removal (days):  7 Hemostasis achieved with: suture and pressure Outcome: patient tolerated procedure well with no complications   Post-procedure details: sterile dressing applied and wound care instructions given   Dressing type: bandage and pressure dressing    mupirocin ointment (BACTROBAN) 2 %  Specimen 1 - Surgical pathology Differential Diagnosis: D48.5 r/o cyst vs other  Check Margins: Yes 2.5 cm Firm SQ nodule  Start mupirocin oint qd with dressing changes  Return in about 1 week (around 04/05/2020) for suture removal.  I, Rudell Cobb, CMA, am acting as scribe for Sarina Ser, MD .  Documentation: I have reviewed the above documentation for accuracy and completeness, and I agree with the above.  Sarina Ser, MD

## 2020-03-29 NOTE — Telephone Encounter (Signed)
Left pt msg to call if any problems after today's procedure./sh

## 2020-03-31 ENCOUNTER — Encounter: Payer: Self-pay | Admitting: Dermatology

## 2020-04-05 ENCOUNTER — Ambulatory Visit (INDEPENDENT_AMBULATORY_CARE_PROVIDER_SITE_OTHER): Payer: Medicare Other | Admitting: Dermatology

## 2020-04-05 ENCOUNTER — Other Ambulatory Visit: Payer: Self-pay

## 2020-04-05 DIAGNOSIS — L720 Epidermal cyst: Secondary | ICD-10-CM

## 2020-04-05 NOTE — Progress Notes (Signed)
   Follow-Up Visit   Subjective  Beth Hart is a 69 y.o. female who presents for the following: Post-op Follow-up (suture removal from right mid back paraspinal Bx proven cyst).  The following portions of the chart were reviewed this encounter and updated as appropriate:  Tobacco  Allergies  Meds  Problems  Med Hx  Surg Hx  Fam Hx      Objective  Well appearing patient in no apparent distress; mood and affect are within normal limits.  A focused examination was performed including back. Relevant physical exam findings are noted in the Assessment and Plan.  Objective  Right Mid Back paraspinal: Healing excision site  Assessment & Plan  Epidermal cyst Right Mid Back paraspinal  Bx proven Healing well  Wound cleansed, sutures removed, wound cleansed and steri strips applied. Discussed pathology results.   Return for To be scheduled for TBSE.   I, Othelia Pulling, RMA, am acting as scribe for Sarina Ser, MD .  Documentation: I have reviewed the above documentation for accuracy and completeness, and I agree with the above.  Sarina Ser, MD

## 2020-04-11 ENCOUNTER — Encounter: Payer: Self-pay | Admitting: Dermatology

## 2020-05-25 ENCOUNTER — Other Ambulatory Visit: Payer: Self-pay | Admitting: Family Medicine

## 2020-06-08 DIAGNOSIS — B351 Tinea unguium: Secondary | ICD-10-CM | POA: Diagnosis not present

## 2020-07-21 DIAGNOSIS — N1831 Chronic kidney disease, stage 3a: Secondary | ICD-10-CM | POA: Diagnosis not present

## 2020-07-21 DIAGNOSIS — N2581 Secondary hyperparathyroidism of renal origin: Secondary | ICD-10-CM | POA: Diagnosis not present

## 2020-07-25 ENCOUNTER — Telehealth (INDEPENDENT_AMBULATORY_CARE_PROVIDER_SITE_OTHER): Payer: Medicare Other | Admitting: Family Medicine

## 2020-07-25 ENCOUNTER — Encounter: Payer: Self-pay | Admitting: Family Medicine

## 2020-07-25 ENCOUNTER — Other Ambulatory Visit: Payer: Self-pay

## 2020-07-25 VITALS — BP 122/77 | Ht 62.0 in | Wt 162.0 lb

## 2020-07-25 DIAGNOSIS — F330 Major depressive disorder, recurrent, mild: Secondary | ICD-10-CM | POA: Diagnosis not present

## 2020-07-25 DIAGNOSIS — R7309 Other abnormal glucose: Secondary | ICD-10-CM

## 2020-07-25 DIAGNOSIS — N1831 Chronic kidney disease, stage 3a: Secondary | ICD-10-CM | POA: Diagnosis not present

## 2020-07-25 DIAGNOSIS — E663 Overweight: Secondary | ICD-10-CM | POA: Diagnosis not present

## 2020-07-25 DIAGNOSIS — J069 Acute upper respiratory infection, unspecified: Secondary | ICD-10-CM | POA: Diagnosis not present

## 2020-07-25 DIAGNOSIS — E669 Obesity, unspecified: Secondary | ICD-10-CM | POA: Insufficient documentation

## 2020-07-25 DIAGNOSIS — R531 Weakness: Secondary | ICD-10-CM | POA: Insufficient documentation

## 2020-07-25 DIAGNOSIS — E66811 Obesity, class 1: Secondary | ICD-10-CM | POA: Insufficient documentation

## 2020-07-25 DIAGNOSIS — R55 Syncope and collapse: Secondary | ICD-10-CM | POA: Insufficient documentation

## 2020-07-25 MED ORDER — CITALOPRAM HYDROBROMIDE 20 MG PO TABS
10.0000 mg | ORAL_TABLET | ORAL | 4 refills | Status: DC
Start: 2020-07-25 — End: 2021-08-18

## 2020-07-25 NOTE — Assessment & Plan Note (Signed)
Stable.  She will continue Celexa 10 mg every other day.  Discussed if she needed to she could increase back to 10 mg every day or she could discontinue the medication if she desired to try that.  She would let us know if she ended up discontinuing the medication.

## 2020-07-25 NOTE — Assessment & Plan Note (Signed)
I encouraged healthy diet and exercise.  Discussed that she needed to get at least 1200 cal/day for adequate caloric intake for her body to function.

## 2020-07-25 NOTE — Assessment & Plan Note (Signed)
This occurred during a blood donation.  She had eaten poorly.  This is most consistent with vasovagal syncope.  We will monitor.

## 2020-07-25 NOTE — Assessment & Plan Note (Signed)
Stable on most recent labs.  Avoid NSAIDs.  She will continue to see nephrology.

## 2020-07-25 NOTE — Assessment & Plan Note (Signed)
Resolving.  Likely a virus.  Discussed that could have been COVID-19 though at this point she has met CDC criteria for coming off of quarantine if she were to have Covid.  Advised that she would need to continue to wear a mask for the next several days.  She will let us know if she has any worsening symptoms.

## 2020-07-25 NOTE — Progress Notes (Signed)
Virtual Visit via telephone Note  This visit type was conducted due to national recommendations for restrictions regarding the COVID-19 pandemic (e.g. social distancing).  This format is felt to be most appropriate for this patient at this time.  All issues noted in this document were discussed and addressed.  No physical exam was performed (except for noted visual exam findings with Video Visits).   I connected with Roslynn Amble today at  8:30 AM EDT by a video enabled telemedicine application or telephone and verified that I am speaking with the correct person using two identifiers. Location patient: home Location provider: work Persons participating in the virtual visit: patient, provider  I discussed the limitations, risks, security and privacy concerns of performing an evaluation and management service by telephone and the availability of in person appointments. I also discussed with the patient that there may be a patient responsible charge related to this service. The patient expressed understanding and agreed to proceed.  Interactive audio and video telecommunications were attempted between this provider and patient, however failed, due to patient having technical difficulties OR patient did not have access to video capability.  We continued and completed visit with audio only.   Reason for visit: f/u  HPI: Depression: Patient has been taking Celexa though is only taking 10 mg every other day.  She notes she needs some in her system though does not want to come completely off of it as things have not gone well in the past when she stops the medication.  She notes this winter was better than those in the past.  Staying busy helps.  No excessive anxiety.  No SI.  CKD stage III: She follows with nephrology.  Her recent lab work was stable.  She avoids NSAIDs.  Overweight: Patient notes she had lost about 7 pounds.  She was tracking her food closely and that helped quite a bit though  she got off track around Christmas time and has put the weight back on.  She had not been being very active though with the weather getting better she has been doing lots of yard work.  The patient notes over the last couple of weeks she has gotten back to monitoring her food intake.  Upper respiratory infection: The patient reports 9 days ago she started with sniffles and some congestion and cough.  She does oftentimes have these symptoms this time of year though she did not feel it was allergies.  She has improved quite a bit at this point.  She did not get tested for COVID-19.  Weakness: Patient notes for the last several weeks she has felt weak in her legs and shaky.  She started checking her glucose and notes it has ranged 103-133.  Those are not fasting values.  She notes a history of hypoglycemia in the past if she does not eat adequately.  She also notes couple of weeks ago she gave blood and had not eaten very well prior to giving blood and then passed out.   ROS: See pertinent positives and negatives per HPI.  Past Medical History:  Diagnosis Date  . Anemia   . Arthritis   . Asthma    as a child  . Chicken pox   . Colon polyps 2012  . Depression   . Hemorrhoids     Past Surgical History:  Procedure Laterality Date  . BREAST CYST ASPIRATION Left 90s  . BREAST SURGERY  2007   aspiration  . CARPAL TUNNEL RELEASE  2010  . REFRACTIVE SURGERY    . TONSILLECTOMY      Family History  Problem Relation Age of Onset  . Alzheimer's disease Mother   . Lung cancer Father   . Breast cancer Neg Hx     SOCIAL HX: Non-smoker   Current Outpatient Medications:  .  CALCIUM-VITAMINS C & D PO, Take by mouth., Disp: , Rfl:  .  Multiple Vitamin (MULTIVITAMIN) tablet, Take 1 tablet by mouth daily., Disp: , Rfl:  .  citalopram (CELEXA) 20 MG tablet, Take 0.5 tablets (10 mg total) by mouth every other day., Disp: 15 tablet, Rfl: 4  EXAM: This was a phone visit and thus no physical exam  was completed.  ASSESSMENT AND PLAN:  Discussed the following assessment and plan:  Problem List Items Addressed This Visit    CKD (chronic kidney disease) stage 3, GFR 30-59 ml/min (HCC)    Stable on most recent labs.  Avoid NSAIDs.  She will continue to see nephrology.      Depression    Stable.  She will continue Celexa 10 mg every other day.  Discussed if she needed to she could increase back to 10 mg every day or she could discontinue the medication if she desired to try that.  She would let us know if she ended up discontinuing the medication.      Relevant Medications   citalopram (CELEXA) 20 MG tablet   Overweight    I encouraged healthy diet and exercise.  Discussed that she needed to get at least 1200 cal/day for adequate caloric intake for her body to function.      URI (upper respiratory infection)    Resolving.  Likely a virus.  Discussed that could have been COVID-19 though at this point she has met CDC criteria for coming off of quarantine if she were to have Covid.  Advised that she would need to continue to wear a mask for the next several days.  She will let us know if she has any worsening symptoms.      Vasovagal syncope    This occurred during a blood donation.  She had eaten poorly.  This is most consistent with vasovagal syncope.  We will monitor.      Weakness    Seems to be somewhat generalized.  Possibly related to elevated glucose or poor oral intake.  She did give blood recently and thus could be anemic.  We will check lab work as outlined below.  Encouraged adequate oral intake.      Relevant Orders   Hepatic function panel   Hemoglobin A1c   CBC   TSH    Other Visit Diagnoses    Elevated glucose    -  Primary   Relevant Orders   Hemoglobin A1c       I discussed the assessment and treatment plan with the patient. The patient was provided an opportunity to ask questions and all were answered. The patient agreed with the plan and demonstrated  an understanding of the instructions.   The patient was advised to call back or seek an in-person evaluation if the symptoms worsen or if the condition fails to improve as anticipated.  I provided 18 minutes of non-face-to-face time during this encounter.   Tommi Rumps, MD

## 2020-07-25 NOTE — Assessment & Plan Note (Signed)
Seems to be somewhat generalized.  Possibly related to elevated glucose or poor oral intake.  She did give blood recently and thus could be anemic.  We will check lab work as outlined below.  Encouraged adequate oral intake.

## 2020-08-03 ENCOUNTER — Other Ambulatory Visit (INDEPENDENT_AMBULATORY_CARE_PROVIDER_SITE_OTHER): Payer: Medicare Other

## 2020-08-03 ENCOUNTER — Other Ambulatory Visit: Payer: Self-pay

## 2020-08-03 DIAGNOSIS — R7309 Other abnormal glucose: Secondary | ICD-10-CM

## 2020-08-03 DIAGNOSIS — R531 Weakness: Secondary | ICD-10-CM

## 2020-08-03 LAB — HEPATIC FUNCTION PANEL
ALT: 12 U/L (ref 0–35)
AST: 16 U/L (ref 0–37)
Albumin: 4 g/dL (ref 3.5–5.2)
Alkaline Phosphatase: 88 U/L (ref 39–117)
Bilirubin, Direct: 0.1 mg/dL (ref 0.0–0.3)
Total Bilirubin: 0.4 mg/dL (ref 0.2–1.2)
Total Protein: 6.3 g/dL (ref 6.0–8.3)

## 2020-08-03 LAB — CBC
HCT: 37 % (ref 36.0–46.0)
Hemoglobin: 12.3 g/dL (ref 12.0–15.0)
MCHC: 33.2 g/dL (ref 30.0–36.0)
MCV: 92.3 fl (ref 78.0–100.0)
Platelets: 196 10*3/uL (ref 150.0–400.0)
RBC: 4 Mil/uL (ref 3.87–5.11)
RDW: 14.3 % (ref 11.5–15.5)
WBC: 6.5 10*3/uL (ref 4.0–10.5)

## 2020-08-03 LAB — HEMOGLOBIN A1C: Hgb A1c MFr Bld: 5.9 % (ref 4.6–6.5)

## 2020-08-03 LAB — TSH: TSH: 2.38 u[IU]/mL (ref 0.35–4.50)

## 2020-08-09 ENCOUNTER — Telehealth: Payer: Self-pay | Admitting: Family Medicine

## 2020-08-09 NOTE — Telephone Encounter (Signed)
Pt returned your call.  

## 2020-08-09 NOTE — Telephone Encounter (Signed)
Patient returned office phone about results.

## 2020-08-09 NOTE — Telephone Encounter (Signed)
Spoke with patient and informed her of her lab results and she understood.  Beth Hart,cma

## 2020-08-09 NOTE — Telephone Encounter (Signed)
LVM for the patient to call back for lab results.  Beth Hart,cma

## 2020-10-17 ENCOUNTER — Ambulatory Visit (INDEPENDENT_AMBULATORY_CARE_PROVIDER_SITE_OTHER): Payer: Medicare Other | Admitting: Dermatology

## 2020-10-17 ENCOUNTER — Other Ambulatory Visit: Payer: Self-pay

## 2020-10-17 DIAGNOSIS — D1800 Hemangioma unspecified site: Secondary | ICD-10-CM

## 2020-10-17 DIAGNOSIS — I781 Nevus, non-neoplastic: Secondary | ICD-10-CM | POA: Diagnosis not present

## 2020-10-17 DIAGNOSIS — L820 Inflamed seborrheic keratosis: Secondary | ICD-10-CM

## 2020-10-17 DIAGNOSIS — D229 Melanocytic nevi, unspecified: Secondary | ICD-10-CM | POA: Diagnosis not present

## 2020-10-17 DIAGNOSIS — L821 Other seborrheic keratosis: Secondary | ICD-10-CM

## 2020-10-17 DIAGNOSIS — Z1283 Encounter for screening for malignant neoplasm of skin: Secondary | ICD-10-CM

## 2020-10-17 DIAGNOSIS — L814 Other melanin hyperpigmentation: Secondary | ICD-10-CM | POA: Diagnosis not present

## 2020-10-17 DIAGNOSIS — L578 Other skin changes due to chronic exposure to nonionizing radiation: Secondary | ICD-10-CM | POA: Diagnosis not present

## 2020-10-17 NOTE — Patient Instructions (Signed)

## 2020-10-17 NOTE — Progress Notes (Signed)
Follow-Up Visit   Subjective  Beth Hart is a 70 y.o. female who presents for the following: Annual Exam (No hx of skin CA or dysplastic nevi  ). The patient presents for Total-Body Skin Exam (TBSE) for skin cancer screening and mole check. Patient has noticed irregular skin lesions on her forehead and right shoulder that she would like checked today.   The following portions of the chart were reviewed this encounter and updated as appropriate:   Tobacco  Allergies  Meds  Problems  Med Hx  Surg Hx  Fam Hx      Review of Systems:  No other skin or systemic complaints except as noted in HPI or Assessment and Plan.  Objective  Well appearing patient in no apparent distress; mood and affect are within normal limits.  A full examination was performed including scalp, head, eyes, ears, nose, lips, neck, chest, axillae, abdomen, back, buttocks, bilateral upper extremities, bilateral lower extremities, hands, feet, fingers, toes, fingernails, and toenails. All findings within normal limits unless otherwise noted below.  R forehead x 1, R shoulder x 2, R thigh x 1, R lower leg 4 for a total of 8 (8) Erythematous keratotic or waxy stuck-on papule or plaque.   Assessment & Plan  Inflamed seborrheic keratosis R forehead x 1, R shoulder x 2, R thigh x 1, R lower leg 4 for a total of 8  Destruction of lesion - R forehead x 1, R shoulder x 2, R thigh x 1, R lower leg 4 for a total of 8 Complexity: simple   Destruction method: cryotherapy   Informed consent: discussed and consent obtained   Timeout:  patient name, date of birth, surgical site, and procedure verified Lesion destroyed using liquid nitrogen: Yes   Region frozen until ice ball extended beyond lesion: Yes   Outcome: patient tolerated procedure well with no complications   Post-procedure details: wound care instructions given    Skin cancer screening  Lentigines - Scattered tan macules - Due to sun exposure -  Benign-appering, observe - Recommend daily broad spectrum sunscreen SPF 30+ to sun-exposed areas, reapply every 2 hours as needed. - Call for any changes  Seborrheic Keratoses - Stuck-on, waxy, tan-brown papules and/or plaques  - Benign-appearing - Discussed benign etiology and prognosis. - Observe - Call for any changes  Melanocytic Nevi - Tan-brown and/or pink-flesh-colored symmetric macules and papules - Benign appearing on exam today - Observation - Call clinic for new or changing moles - Recommend daily use of broad spectrum spf 30+ sunscreen to sun-exposed areas.   Hemangiomas - Red papules - Discussed benign nature - Observe - Call for any changes  Actinic Damage - Chronic condition, secondary to cumulative UV/sun exposure - diffuse scaly erythematous macules with underlying dyspigmentation - Recommend daily broad spectrum sunscreen SPF 30+ to sun-exposed areas, reapply every 2 hours as needed.  - Staying in the shade or wearing long sleeves, sun glasses (UVA+UVB protection) and wide brim hats (4-inch brim around the entire circumference of the hat) are also recommended for sun protection.  - Call for new or changing lesions.  Spider Veins - Dilated blue, purple or red veins at the lower extremities - Reassured - These can be treated by sclerotherapy (a procedure to inject a medicine into the veins to make them disappear) if desired, but the treatment is not covered by insurance  Skin cancer screening performed today.  Return if symptoms worsen or fail to improve.  Luther Redo,  CMA, am acting as scribe for Sarina Ser, MD .  Documentation: I have reviewed the above documentation for accuracy and completeness, and I agree with the above.  Sarina Ser, MD

## 2020-10-19 ENCOUNTER — Encounter: Payer: Self-pay | Admitting: Dermatology

## 2020-12-09 ENCOUNTER — Ambulatory Visit: Payer: Medicare Other

## 2020-12-12 ENCOUNTER — Other Ambulatory Visit: Payer: Self-pay | Admitting: Family Medicine

## 2020-12-12 DIAGNOSIS — Z1231 Encounter for screening mammogram for malignant neoplasm of breast: Secondary | ICD-10-CM

## 2020-12-28 ENCOUNTER — Ambulatory Visit: Payer: Medicare Other

## 2020-12-28 ENCOUNTER — Telehealth: Payer: Self-pay | Admitting: Family Medicine

## 2020-12-28 NOTE — Telephone Encounter (Signed)
Left message asking patient to call I gave both office number and my number [phone]   Please r/s 12/28/20 AWV appointment denisa will be out of office

## 2021-02-02 ENCOUNTER — Ambulatory Visit
Admission: RE | Admit: 2021-02-02 | Discharge: 2021-02-02 | Disposition: A | Discharge status: Home / Self Care | Payer: Medicare Other | Source: Ambulatory Visit | Attending: Family Medicine | Admitting: Family Medicine

## 2021-02-02 ENCOUNTER — Other Ambulatory Visit: Payer: Self-pay

## 2021-02-02 DIAGNOSIS — Z1231 Encounter for screening mammogram for malignant neoplasm of breast: Secondary | ICD-10-CM | POA: Insufficient documentation

## 2021-02-13 ENCOUNTER — Telehealth: Payer: Self-pay | Admitting: Family Medicine

## 2021-02-13 NOTE — Telephone Encounter (Signed)
Copied from [place] [id number]. Topic: Medicare AWV >> Feb 13, 2021 11:14 AM Harris-Coley, Hannah Beat wrote: Reason for CRM: LVM 02/13/21@11 :12am to r/s AWV appt 02/23/21 khc.

## 2021-02-23 ENCOUNTER — Ambulatory Visit: Payer: Medicare Other

## 2021-03-16 DIAGNOSIS — Z23 Encounter for immunization: Secondary | ICD-10-CM | POA: Diagnosis not present

## 2021-04-13 ENCOUNTER — Ambulatory Visit: Payer: Medicare Other

## 2021-05-03 DIAGNOSIS — H2513 Age-related nuclear cataract, bilateral: Secondary | ICD-10-CM | POA: Diagnosis not present

## 2021-05-08 DIAGNOSIS — M7062 Trochanteric bursitis, left hip: Secondary | ICD-10-CM | POA: Diagnosis not present

## 2021-05-18 ENCOUNTER — Ambulatory Visit (INDEPENDENT_AMBULATORY_CARE_PROVIDER_SITE_OTHER): Payer: Medicare Other

## 2021-05-18 VITALS — Ht 62.0 in | Wt 162.0 lb

## 2021-05-18 DIAGNOSIS — Z0000 Encounter for general adult medical examination without abnormal findings: Secondary | ICD-10-CM | POA: Diagnosis not present

## 2021-05-18 NOTE — Patient Instructions (Addendum)
Beth Hart , Thank you for taking time to come for your Medicare Wellness Visit. I appreciate your ongoing commitment to your health goals. Please review the following plan we discussed and let me know if I can assist you in the future.   These are the goals we discussed:  Goals      Follow up with Primary Care Provider     Keep all routine maintenance appointments.        This is a list of the screening recommended for you and due dates:  Health Maintenance  Topic Date Due   COVID-19 Vaccine (3 - Pfizer risk series) 06/03/2021*   Colon Cancer Screening  11/23/2022   Mammogram  02/03/2023   Tetanus Vaccine  05/28/2024   Pneumonia Vaccine  Completed   Flu Shot  Completed   DEXA scan (bone density measurement)  Completed   Zoster (Shingles) Vaccine  Completed   HPV Vaccine  Aged Out  *Topic was postponed. The date shown is not the original due date.    Advanced directives: on file  Conditions/risks identified: none new  Follow up in one year for your annual wellness visit    Preventive Care 65 Years and Older, Female Preventive care refers to lifestyle choices and visits with your health care provider that can promote health and wellness. What does preventive care include? A yearly physical exam. This is also called an annual well check. Dental exams once or twice a year. Routine eye exams. Ask your health care provider how often you should have your eyes checked. Personal lifestyle choices, including: Daily care of your teeth and gums. Regular physical activity. Eating a healthy diet. Avoiding tobacco and drug use. Limiting alcohol use. Practicing safe sex. Taking low-dose aspirin every day. Taking vitamin and mineral supplements as recommended by your health care provider. What happens during an annual well check? The services and screenings done by your health care provider during your annual well check will depend on your age, overall health, lifestyle risk  factors, and family history of disease. Counseling  Your health care provider may ask you questions about your: Alcohol use. Tobacco use. Drug use. Emotional well-being. Home and relationship well-being. Sexual activity. Eating habits. History of falls. Memory and ability to understand (cognition). Work and work Statistician. Reproductive health. Screening  You may have the following tests or measurements: Height, weight, and BMI. Blood pressure. Lipid and cholesterol levels. These may be checked every 5 years, or more frequently if you are over 26 years old. Skin check. Lung cancer screening. You may have this screening every year starting at age 89 if you have a 30-pack-year history of smoking and currently smoke or have quit within the past 15 years. Fecal occult blood test (FOBT) of the stool. You may have this test every year starting at age 73. Flexible sigmoidoscopy or colonoscopy. You may have a sigmoidoscopy every 5 years or a colonoscopy every 10 years starting at age 97. Hepatitis C blood test. Hepatitis B blood test. Sexually transmitted disease (STD) testing. Diabetes screening. This is done by checking your blood sugar (glucose) after you have not eaten for a while (fasting). You may have this done every 1-3 years. Bone density scan. This is done to screen for osteoporosis. You may have this done starting at age 45. Mammogram. This may be done every 1-2 years. Talk to your health care provider about how often you should have regular mammograms. Talk with your health care provider about your test results,  treatment options, and if necessary, the need for more tests. Vaccines  Your health care provider may recommend certain vaccines, such as: Influenza vaccine. This is recommended every year. Tetanus, diphtheria, and acellular pertussis (Tdap, Td) vaccine. You may need a Td booster every 10 years. Zoster vaccine. You may need this after age 2. Pneumococcal 13-valent  conjugate (PCV13) vaccine. One dose is recommended after age 48. Pneumococcal polysaccharide (PPSV23) vaccine. One dose is recommended after age 54. Talk to your health care provider about which screenings and vaccines you need and how often you need them. This information is not intended to replace advice given to you by your health care provider. Make sure you discuss any questions you have with your health care provider. Document Released: 05/13/2015 Document Revised: 01/04/2016 Document Reviewed: 02/15/2015 Elsevier Interactive Patient Education  2017 [place] Prevention in the Home Falls can cause injuries. They can happen to people of all ages. There are many things you can do to make your home safe and to help prevent falls. What can I do on the outside of my home? Regularly fix the edges of walkways and driveways and fix any cracks. Remove anything that might make you trip as you walk through a door, such as a raised step or threshold. Trim any bushes or trees on the path to your home. Use bright outdoor lighting. Clear any walking paths of anything that might make someone trip, such as rocks or tools. Regularly check to see if handrails are loose or broken. Make sure that both sides of any steps have handrails. Any raised decks and porches should have guardrails on the edges. Have any leaves, snow, or ice cleared regularly. Use sand or salt on walking paths during winter. Clean up any spills in your garage right away. This includes oil or grease spills. What can I do in the bathroom? Use night lights. Install grab bars by the toilet and in the tub and shower. Do not use towel bars as grab bars. Use non-skid mats or decals in the tub or shower. If you need to sit down in the shower, use a plastic, non-slip stool. Keep the floor dry. Clean up any water that spills on the floor as soon as it happens. Remove soap buildup in the tub or shower regularly. Attach bath mats  securely with double-sided non-slip rug tape. Do not have throw rugs and other things on the floor that can make you trip. What can I do in the bedroom? Use night lights. Make sure that you have a light by your bed that is easy to reach. Do not use any sheets or blankets that are too big for your bed. They should not hang down onto the floor. Have a firm chair that has side arms. You can use this for support while you get dressed. Do not have throw rugs and other things on the floor that can make you trip. What can I do in the kitchen? Clean up any spills right away. Avoid walking on wet floors. Keep items that you use a lot in easy-to-reach places. If you need to reach something above you, use a strong step stool that has a grab bar. Keep electrical cords out of the way. Do not use floor polish or wax that makes floors slippery. If you must use wax, use non-skid floor wax. Do not have throw rugs and other things on the floor that can make you trip. What can I do with my stairs? Do  not leave any items on the stairs. Make sure that there are handrails on both sides of the stairs and use them. Fix handrails that are broken or loose. Make sure that handrails are as long as the stairways. Check any carpeting to make sure that it is firmly attached to the stairs. Fix any carpet that is loose or worn. Avoid having throw rugs at the top or bottom of the stairs. If you do have throw rugs, attach them to the floor with carpet tape. Make sure that you have a light switch at the top of the stairs and the bottom of the stairs. If you do not have them, ask someone to add them for you. What else can I do to help prevent falls? Wear shoes that: Do not have high heels. Have rubber bottoms. Are comfortable and fit you well. Are closed at the toe. Do not wear sandals. If you use a stepladder: Make sure that it is fully opened. Do not climb a closed stepladder. Make sure that both sides of the stepladder  are locked into place. Ask someone to hold it for you, if possible. Clearly mark and make sure that you can see: Any grab bars or handrails. First and last steps. Where the edge of each step is. Use tools that help you move around (mobility aids) if they are needed. These include: Canes. Walkers. Scooters. Crutches. Turn on the lights when you go into a dark area. Replace any light bulbs as soon as they burn out. Set up your furniture so you have a clear path. Avoid moving your furniture around. If any of your floors are uneven, fix them. If there are any pets around you, be aware of where they are. Review your medicines with your doctor. Some medicines can make you feel dizzy. This can increase your chance of falling. Ask your doctor what other things that you can do to help prevent falls. This information is not intended to replace advice given to you by your health care provider. Make sure you discuss any questions you have with your health care provider. Document Released: 02/10/2009 Document Revised: 09/22/2015 Document Reviewed: 05/21/2014 Elsevier Interactive Patient Education  2017 [employer].

## 2021-05-18 NOTE — Progress Notes (Signed)
Subjective:   Beth Hart is a 71 y.o. female who presents for Medicare Annual (Subsequent) preventive examination.  Review of Systems    No ROS.  Medicare Wellness Virtual Visit.  Visual/audio telehealth visit, UTA vital signs.   See social history for additional risk factors.   Cardiac Risk Factors include: advanced age (>93men, >98 women)     Objective:    Today's Vitals   05/18/21 0905  Weight: 162 lb (73.5 kg)  Height: 5\' 2"  (1.575 m)   Body mass index is 29.63 kg/m.  Advanced Directives 05/18/2021 12/09/2019 12/08/2018 12/05/2017  Does Patient Have a Medical Advance Directive? Yes Yes Yes Yes  Type of Paramedic of [place];Living will Healthcare Power of [place];Living will [place];Living will  Does patient want to make changes to medical advance directive? No - Patient declined No - Patient declined No - Patient declined No - Patient declined  Copy of [place] in Chart? Yes - validated most recent copy scanned in chart (See row information) No - copy requested No - copy requested No - copy requested    Current Medications (verified) Outpatient Encounter Medications as of 05/18/2021  Medication Sig   CALCIUM-VITAMINS C & D PO Take by mouth.   citalopram (CELEXA) 20 MG tablet Take 0.5 tablets (10 mg total) by mouth every other day.   Multiple Vitamin (MULTIVITAMIN) tablet Take 1 tablet by mouth daily.   No facility-administered encounter medications on file as of 05/18/2021.    Allergies (verified) Patient has no known allergies.   History: Past Medical History:  Diagnosis Date   Anemia    Arthritis    Asthma    as a child   Chicken pox    Colon polyps 2012   Depression    Hemorrhoids    Past Surgical History:  Procedure Laterality Date   BREAST CYST ASPIRATION Left 90s   BREAST SURGERY  2007   aspiration   CARPAL TUNNEL RELEASE  2010   REFRACTIVE SURGERY      TONSILLECTOMY     Family History  Problem Relation Age of Onset   Alzheimer's disease Mother    Lung cancer Father    Breast cancer Neg Hx    Social History   Socioeconomic History   Marital status: Married    Spouse name: Not on file   Number of children: Not on file   Years of education: Not on file   Highest education level: Not on file  Occupational History   Not on file  Tobacco Use   Smoking status: Never   Smokeless tobacco: Never  Vaping Use   Vaping Use: Never used  Substance and Sexual Activity   Alcohol use: No   Drug use: No   Sexual activity: Yes  Other Topics Concern   Not on file  Social History Narrative   Not on file   Social Determinants of Health   Financial Resource Strain: Low Risk    Difficulty of Paying Living Expenses: Not hard at all  Food Insecurity: No Food Insecurity   Worried About Charity fundraiser in the Last Year: Never true   Ran Out of Food in the Last Year: Never true  Transportation Needs: No Transportation Needs   Lack of Transportation (Medical): No   Lack of Transportation (Non-Medical): No  Physical Activity: Insufficiently Active   Days of Exercise per Week: 2 days   Minutes of Exercise  per Session: 30 min  Stress: No Stress Concern Present   Feeling of Stress : Not at all  Social Connections: Unknown   Frequency of Communication with Friends and Family: More than three times a week   Frequency of Social Gatherings with Friends and Family: More than three times a week   Attends Religious Services: Not on Electrical engineer or Organizations: Not on file   Attends Archivist Meetings: Not on file   Marital Status: Married    Tobacco Counseling Counseling given: Not Answered   Clinical Intake:  Pre-visit preparation completed: Yes        Diabetes: No  How often do you need to have someone help you when you read instructions, pamphlets, or other written materials from your doctor or  pharmacy?: 1 - Never   Interpreter Needed?: No    Activities of Daily Living In your present state of health, do you have any difficulty performing the following activities: 05/18/2021  Hearing? N  Vision? N  Difficulty concentrating or making decisions? N  Walking or climbing stairs? N  Comment Paces self. Hip L Bursitis.  Dressing or bathing? N  Doing errands, shopping? N  Preparing Food and eating ? N  Using the Toilet? N  In the past six months, have you accidently leaked urine? N  Do you have problems with loss of bowel control? N  Managing your Medications? N  Managing your Finances? N  Housekeeping or managing your Housekeeping? N  Some recent data might be hidden    Patient Care Team: Leone Haven, MD as PCP - General (Family Medicine)  Indicate any recent Medical Services you may have received from other than [facility] providers in the past year (date may be approximate).     Assessment:   This is a routine wellness examination for Beth Hart.  Virtual Visit via Telephone Note  I connected with  RONNIESHA SEIBOLD on 05/18/21 at  9:00 AM EST by telephone and verified that I am speaking with the correct person using two identifiers.  Persons participating in the virtual visit: patient/Nurse Health Advisor   I discussed the limitations, risks, security and privacy concerns of performing an evaluation and management service by telephone and the availability of in person appointments. The patient expressed understanding and agreed to proceed.  Interactive audio and video telecommunications were attempted between this nurse and patient, however failed, due to patient having technical difficulties OR patient did not have access to video capability.  We continued and completed visit with audio only.  Some vital signs may be absent or patient reported.   Hearing/Vision screen Hearing Screening - Comments:: Patient is able to hear conversational tones without difficulty.  No  issues reported.  Vision Screening - Comments:: Followed by [facility] (Dr. Jeni Salles) Wears corrective lenses to drive and read Lasik surgery  They have regular follow up with the ophthalmologist  Dietary issues and exercise activities discussed: Current Exercise Habits: Home exercise routine, Type of exercise: treadmill, Intensity: Mild Healthy diet Good water intake   Goals Addressed             This Visit's Progress    Follow up with Primary Care Provider       Keep all routine maintenance appointments.       Depression Screen PHQ 2/9 Scores 05/18/2021 07/25/2020 01/25/2020 12/09/2019 06/15/2019 12/10/2018 12/08/2018  PHQ - 2 Score 0 0 0 0 0 0 1  PHQ- 9 Score - -  6 - - - -    Fall Risk Fall Risk  05/18/2021 07/25/2020 12/09/2019 06/15/2019 12/10/2018  Falls in the past year? 0 0 0 0 0  Number falls in past yr: 0 0 0 0 0  Follow up Falls evaluation completed Falls evaluation completed Falls evaluation completed Falls evaluation completed -   FALL RISK PREVENTION PERTAINING TO THE HOME: Home free of loose throw rugs in walkways, pet beds, electrical cords, etc? Yes  Adequate lighting in your home to reduce risk of falls? Yes   ASSISTIVE DEVICES UTILIZED TO PREVENT FALLS: Life alert? No  Use of a cane, walker or w/c? No  Grab bars in the bathroom? No  Shower chair or bench in shower? No  Elevated toilet seat? Yes   TIMED UP AND GO: Was the test performed? No .   Cognitive Function: Patient is alert and oriented x3.  Enjoys paint by numbers, reading, puzzles and other brain stimulating activities.   MMSE - Mini Mental State Exam 12/09/2019 12/05/2017  Not completed: Unable to complete -  Orientation to time - 5  Orientation to Place - 5  Registration - 3  Attention/ Calculation - 5  Recall - 3  Language- name 2 objects - 2  Language- repeat - 1  Language- follow 3 step command - 3  Language- read & follow direction - 1  Write a sentence - 1  Copy design -  1  Total score - 30     6CIT Screen 05/18/2021 12/08/2018  What Year? 0 points 0 points  What month? 0 points 0 points  What time? 0 points 0 points  Count back from 20 - 0 points  Months in reverse 0 points 0 points  Repeat phrase - 0 points  Total Score - 0   Immunizations Immunization History  Administered Date(s) Administered   Fluad Quad(high Dose 65+) 01/25/2020   Influenza, High Dose Seasonal PF 02/07/2017, 02/06/2018, 02/11/2021   Influenza-Unspecified 01/28/2018, 02/14/2019   PFIZER(Purple Top)SARS-COV-2 Vaccination 12/31/2019, 01/21/2020   Pneumococcal Conjugate-13 02/12/2017   Pneumococcal Polysaccharide-23 11/23/2015   Tdap 05/28/2014   Zoster Recombinat (Shingrix) 04/25/2018, 09/05/2018   Screening Tests Health Maintenance  Topic Date Due   COVID-19 Vaccine (3 - Pfizer risk series) 06/03/2021 (Originally 02/18/2020)   COLONOSCOPY (Pts 45-39yrs Insurance coverage will need to be confirmed)  11/23/2022   MAMMOGRAM  02/03/2023   TETANUS/TDAP  05/28/2024   Pneumonia Vaccine 40+ Years old  Completed   INFLUENZA VACCINE  Completed   DEXA SCAN  Completed   Zoster Vaccines- Shingrix  Completed   HPV VACCINES  Aged Out   Health Maintenance There are no preventive care reminders to display for this patient.  Lung Cancer Screening: (Low Dose CT Chest recommended if Age 86-80 years, 30 pack-year currently smoking OR have quit w/in 15years.) does not qualify.   Vision Screening: Recommended annual ophthalmology exams for early detection of glaucoma and other disorders of the eye. Is the patient up to date with their annual eye exam?  Yes   Dental Screening: Recommended annual dental exams for proper oral hygiene  Community Resource Referral / Chronic Care Management: CRR required this visit?  No   CCM required this visit?  No      Plan:   Keep all routine maintenance appointments.   I have personally reviewed and noted the following in the patients chart:    Medical and social history Use of alcohol, tobacco or illicit drugs  Current medications and supplements  including opioid prescriptions. Not taking opioid.  Functional ability and status Nutritional status Physical activity Advanced directives List of other physicians Hospitalizations, surgeries, and ER visits in previous 12 months Vitals Screenings to include cognitive, depression, and falls Referrals and appointments  In addition, I have reviewed and discussed with patient certain preventive protocols, quality metrics, and best practice recommendations. A written personalized care plan for preventive services as well as general preventive health recommendations were provided to patient.     Varney Biles, LPN   5/68/1275

## 2021-08-03 DIAGNOSIS — N1832 Chronic kidney disease, stage 3b: Secondary | ICD-10-CM | POA: Diagnosis not present

## 2021-08-03 DIAGNOSIS — N2581 Secondary hyperparathyroidism of renal origin: Secondary | ICD-10-CM | POA: Diagnosis not present

## 2021-08-15 ENCOUNTER — Encounter: Payer: Self-pay | Admitting: Family Medicine

## 2021-08-15 ENCOUNTER — Ambulatory Visit (INDEPENDENT_AMBULATORY_CARE_PROVIDER_SITE_OTHER): Payer: Medicare Other | Admitting: Family Medicine

## 2021-08-15 VITALS — BP 118/80 | HR 69 | Temp 97.8°F | Ht 62.0 in | Wt 169.0 lb

## 2021-08-15 DIAGNOSIS — F330 Major depressive disorder, recurrent, mild: Secondary | ICD-10-CM | POA: Diagnosis not present

## 2021-08-15 DIAGNOSIS — R7303 Prediabetes: Secondary | ICD-10-CM | POA: Diagnosis not present

## 2021-08-15 DIAGNOSIS — N1831 Chronic kidney disease, stage 3a: Secondary | ICD-10-CM | POA: Diagnosis not present

## 2021-08-15 DIAGNOSIS — Z1329 Encounter for screening for other suspected endocrine disorder: Secondary | ICD-10-CM

## 2021-08-15 DIAGNOSIS — E785 Hyperlipidemia, unspecified: Secondary | ICD-10-CM

## 2021-08-15 DIAGNOSIS — E669 Obesity, unspecified: Secondary | ICD-10-CM | POA: Diagnosis not present

## 2021-08-15 LAB — HEPATIC FUNCTION PANEL
ALT: 13 U/L (ref 0–35)
AST: 18 U/L (ref 0–37)
Albumin: 4.3 g/dL (ref 3.5–5.2)
Alkaline Phosphatase: 70 U/L (ref 39–117)
Bilirubin, Direct: 0.1 mg/dL (ref 0.0–0.3)
Total Bilirubin: 0.4 mg/dL (ref 0.2–1.2)
Total Protein: 6.6 g/dL (ref 6.0–8.3)

## 2021-08-15 LAB — LIPID PANEL
Cholesterol: 224 mg/dL — ABNORMAL HIGH (ref 0–200)
HDL: 74.9 mg/dL (ref >39.00)
LDL Cholesterol: 128 mg/dL — ABNORMAL HIGH (ref 0–99)
NonHDL: 149.36
Total CHOL/HDL Ratio: 3
Triglycerides: 108 mg/dL (ref 0.0–149.0)
VLDL: 21.6 mg/dL (ref 0.0–40.0)

## 2021-08-15 LAB — TSH: TSH: 2.17 u[IU]/mL (ref 0.35–5.50)

## 2021-08-15 LAB — HEMOGLOBIN A1C: Hgb A1c MFr Bld: 5.9 % (ref 4.6–6.5)

## 2021-08-15 NOTE — Assessment & Plan Note (Signed)
Reports this is well controlled.  She will continue Celexa 10 mg once daily. ?

## 2021-08-15 NOTE — Progress Notes (Signed)
?Tommi Rumps, MD ?Phone: [phone] ? ?Beth Hart is a 71 y.o. female who presents today for follow-up. ? ?Obesity: Patient notes she has altered her diet.  She was eating a restrictive diet though has changed that as she would end up binging eventually.  She is eating lots of salads and fruit.  She has oatmeal for breakfast.  She rarely eats red meat.  She stays active with cleaning 1 house a week.  She also does yard work.  She has been getting on the treadmill for 15 to 20 minutes at a time. ? ?Depression: Patient notes over the winter she had bouts of lowness though no significant depression.  Notes this has improved at this time.  She notes helping others helps her depression improved.  She takes Celexa.  No SI. ? ?CKD stage III: She continues to follow with nephrology.  She recently saw them.  She had a CBC and renal function panel through their office. ? ?Mammogram and colonoscopy are up-to-date.  Patient reports no polyps on recent colonoscopies.  She notes no menstrual cycles.  She notes no first-degree relatives with colon cancer.  No family history of breast cancer or ovarian cancer.  Tetanus vaccine is up-to-date.  Shingrix vaccine and pneumonia vaccines are up-to-date.  She has had 1 COVID-vaccine and declines further vaccinations. ? ?Social History  ? ?Tobacco Use  ?Smoking Status Never  ?Smokeless Tobacco Never  ? ? ?Current Outpatient Medications on File Prior to Visit  ?Medication Sig Dispense Refill  ? CALCIUM-VITAMINS C & D PO Take by mouth.    ? citalopram (CELEXA) 20 MG tablet Take 0.5 tablets (10 mg total) by mouth every other day. 15 tablet 4  ? Multiple Vitamin (MULTIVITAMIN) tablet Take 1 tablet by mouth daily.    ? ?No current facility-administered medications on file prior to visit.  ? ? ? ?ROS see history of present illness ? ?Objective ? ?Physical Exam ?Vitals:  ? 08/15/21 0805  ?BP: 118/80  ?Pulse: 69  ?Temp: 97.8 ?F (36.6 ?C)  ?SpO2: 97%  ? ? ?BP Readings from Last 3  Encounters:  ?08/15/21 118/80  ?07/25/20 122/77  ?01/25/20 120/80  ? ?Wt Readings from Last 3 Encounters:  ?08/15/21 169 lb (76.7 kg)  ?05/18/21 162 lb (73.5 kg)  ?07/25/20 162 lb (73.5 kg)  ? ? ?Physical Exam ?Constitutional:   ?   General: She is not in acute distress. ?   Appearance: She is not diaphoretic.  ?Cardiovascular:  ?   Rate and Rhythm: Normal rate and regular rhythm.  ?   Heart sounds: Normal heart sounds.  ?Pulmonary:  ?   Effort: Pulmonary effort is normal.  ?   Breath sounds: Normal breath sounds.  ?Abdominal:  ?   General: Bowel sounds are normal. There is no distension.  ?   Palpations: Abdomen is soft.  ?   Tenderness: There is no abdominal tenderness.  ?Musculoskeletal:  ?   Right lower leg: No edema.  ?   Left lower leg: No edema.  ?Lymphadenopathy:  ?   Cervical: No cervical adenopathy.  ?Skin: ?   General: Skin is warm and dry.  ?Neurological:  ?   Mental Status: She is alert.  ? ? ? ?Assessment/Plan: Please see individual problem list. ? ?Problem List Items Addressed This Visit   ? ? CKD (chronic kidney disease) stage 3, GFR 30-59 ml/min (HCC) - Primary  ?  She will continue to follow with nephrology. ? ?  ?  ?  Depression  ?  Reports this is well controlled.  She will continue Celexa 10 mg once daily. ? ?  ?  ? Hyperlipidemia  ?  Check lipid panel.  Continue diet and exercise. ? ?  ?  ? Relevant Orders  ? Hepatic function panel  ? Lipid panel  ? Obesity (BMI 30.0-34.9)  ?  Discussed continuing healthy diet and increasing her exercise. ? ?  ?  ? Prediabetes  ?  Check A1c.  Encouraged continuing diet and exercise changes. ? ?  ?  ? Relevant Orders  ? HgB A1c  ? ?Other Visit Diagnoses   ? ? Thyroid disorder screen      ? Relevant Orders  ? TSH  ? ?  ? ? ? ?Health Maintenance: Continue yearly mammogram.  Colonoscopy is due next year.  She declines COVID vaccination. ? ?Return in about 1 year (around 08/16/2022) for Yearly follow-up. ? ?This visit occurred during the SARS-CoV-2 public health  emergency.  Safety protocols were in place, including screening questions prior to the visit, additional usage of staff PPE, and extensive cleaning of exam room while observing appropriate contact time as indicated for disinfecting solutions.  ? ? ?Tommi Rumps, MD ?[place] ? ?

## 2021-08-15 NOTE — Assessment & Plan Note (Signed)
Check lipid panel.  Continue diet and exercise. ?

## 2021-08-15 NOTE — Patient Instructions (Signed)
Nice to see you. ?Please continue to work on diet changes and exercise as we discussed. ?We will contact you with your lab results. ?

## 2021-08-15 NOTE — Assessment & Plan Note (Signed)
Discussed continuing healthy diet and increasing her exercise. ?

## 2021-08-15 NOTE — Assessment & Plan Note (Signed)
She will continue to follow with nephrology. 

## 2021-08-15 NOTE — Assessment & Plan Note (Signed)
Check A1c.  Encouraged continuing diet and exercise changes. ?

## 2021-08-17 ENCOUNTER — Other Ambulatory Visit: Payer: Self-pay | Admitting: Family Medicine

## 2021-12-18 ENCOUNTER — Other Ambulatory Visit: Payer: Self-pay | Admitting: Family Medicine

## 2021-12-18 DIAGNOSIS — Z1231 Encounter for screening mammogram for malignant neoplasm of breast: Secondary | ICD-10-CM

## 2022-02-08 ENCOUNTER — Ambulatory Visit
Admission: RE | Admit: 2022-02-08 | Discharge: 2022-02-08 | Disposition: A | Discharge status: Home / Self Care | Payer: Medicare Other | Source: Ambulatory Visit | Attending: Family Medicine | Admitting: Family Medicine

## 2022-02-08 DIAGNOSIS — Z1231 Encounter for screening mammogram for malignant neoplasm of breast: Secondary | ICD-10-CM | POA: Diagnosis not present

## 2022-02-08 DIAGNOSIS — Z23 Encounter for immunization: Secondary | ICD-10-CM | POA: Diagnosis not present

## 2022-02-16 ENCOUNTER — Ambulatory Visit: Payer: Medicare Other | Admitting: Family Medicine

## 2022-02-23 ENCOUNTER — Ambulatory Visit (INDEPENDENT_AMBULATORY_CARE_PROVIDER_SITE_OTHER): Payer: Medicare Other | Admitting: Family Medicine

## 2022-02-23 ENCOUNTER — Encounter: Payer: Self-pay | Admitting: Family Medicine

## 2022-02-23 VITALS — BP 120/80 | HR 75 | Temp 98.4°F | Ht 62.0 in | Wt 168.8 lb

## 2022-02-23 DIAGNOSIS — R7303 Prediabetes: Secondary | ICD-10-CM

## 2022-02-23 DIAGNOSIS — F330 Major depressive disorder, recurrent, mild: Secondary | ICD-10-CM | POA: Diagnosis not present

## 2022-02-23 DIAGNOSIS — E785 Hyperlipidemia, unspecified: Secondary | ICD-10-CM

## 2022-02-23 LAB — LDL CHOLESTEROL, DIRECT: Direct LDL: 104 mg/dL

## 2022-02-23 LAB — HEMOGLOBIN A1C: Hgb A1c MFr Bld: 6.3 % (ref 4.6–6.5)

## 2022-02-23 NOTE — Assessment & Plan Note (Signed)
She will continue to work on dietary changes and remaining active.  We will check an LDL today.

## 2022-02-23 NOTE — Patient Instructions (Signed)
Nice to see you. We will contact you with your lab results. 

## 2022-02-23 NOTE — Progress Notes (Signed)
  Tommi Rumps, MD Phone: [phone]  Beth Hart is a 71 y.o. female who presents today for f/u.  Depression: Patient notes this is adequately controlled.  She had the occasional low mood though nothing deep.  She notes minimal anxiety that is adequately controlled as well.  She continues on Celexa.  No SI.  Hyperlipidemia/prediabetes: Patient's been working on diet and activity level.  She is cut out eggs and eating more fruit.  She is looking at food labels to help determine what she should eat.  She has been more active with yard work.  She had her cholesterol checked when she donated blood and her total cholesterol is 175.  Social History   Tobacco Use  Smoking Status Never  Smokeless Tobacco Never    Current Outpatient Medications on File Prior to Visit  Medication Sig Dispense Refill   CALCIUM-VITAMINS C & D PO Take by mouth.     citalopram (CELEXA) 20 MG tablet TAKE 0.5 TABLETS (10 MG TOTAL) BY MOUTH. 45 tablet 3   Multiple Vitamin (MULTIVITAMIN) tablet Take 1 tablet by mouth daily.     No current facility-administered medications on file prior to visit.     ROS see history of present illness  Objective  Physical Exam Vitals:   02/23/22 0838  BP: 120/80  Pulse: 75  Temp: 98.4 F (36.9 C)  SpO2: 96%    BP Readings from Last 3 Encounters:  02/23/22 120/80  08/15/21 118/80  07/25/20 122/77   Wt Readings from Last 3 Encounters:  02/23/22 168 lb 12.8 oz (76.6 kg)  08/15/21 169 lb (76.7 kg)  05/18/21 162 lb (73.5 kg)    Physical Exam Constitutional:      General: She is not in acute distress.    Appearance: She is not diaphoretic.  Cardiovascular:     Rate and Rhythm: Normal rate and regular rhythm.     Heart sounds: Normal heart sounds.  Pulmonary:     Effort: Pulmonary effort is normal.     Breath sounds: Normal breath sounds.  Skin:    General: Skin is warm and dry.  Neurological:     Mental Status: She is alert.       Assessment/Plan: Please see individual problem list.  Problem List Items Addressed This Visit     Depression - Primary (Chronic)    Well-controlled.  She will continue Celexa 10 mg daily.      Hyperlipidemia (Chronic)    She will continue to work on dietary changes and remaining active.  We will check an LDL today.      Relevant Orders   Direct LDL   Prediabetes (Chronic)    Check A1c.  Continue to work on diet and exercise changes.      Relevant Orders   HgB A1c     Return in about 6 months (around 08/25/2022) for Your follow-up.   Tommi Rumps, MD [place]

## 2022-02-23 NOTE — Assessment & Plan Note (Signed)
Check A1c.  Continue to work on diet and exercise changes.

## 2022-02-23 NOTE — Assessment & Plan Note (Signed)
Well-controlled.  She will continue Celexa 10 mg daily.

## 2022-03-08 DIAGNOSIS — Z86010 Personal history of colonic polyps: Secondary | ICD-10-CM | POA: Diagnosis not present

## 2022-03-29 DIAGNOSIS — Z86010 Personal history of colonic polyps: Secondary | ICD-10-CM | POA: Diagnosis not present

## 2022-03-29 DIAGNOSIS — K640 First degree hemorrhoids: Secondary | ICD-10-CM | POA: Diagnosis not present

## 2022-05-08 DIAGNOSIS — H2513 Age-related nuclear cataract, bilateral: Secondary | ICD-10-CM | POA: Diagnosis not present

## 2022-05-08 DIAGNOSIS — Z0100 Encounter for examination of eyes and vision without abnormal findings: Secondary | ICD-10-CM | POA: Diagnosis not present

## 2022-05-09 DIAGNOSIS — Z0100 Encounter for examination of eyes and vision without abnormal findings: Secondary | ICD-10-CM | POA: Diagnosis not present

## 2022-05-31 ENCOUNTER — Ambulatory Visit (INDEPENDENT_AMBULATORY_CARE_PROVIDER_SITE_OTHER): Payer: Medicare HMO

## 2022-05-31 VITALS — Ht 62.0 in | Wt 168.0 lb

## 2022-05-31 DIAGNOSIS — Z0000 Encounter for general adult medical examination without abnormal findings: Secondary | ICD-10-CM | POA: Diagnosis not present

## 2022-05-31 NOTE — Progress Notes (Signed)
Subjective:   Beth Hart is a 72 y.o. female who presents for Medicare Annual (Subsequent) preventive examination.  Review of Systems    No ROS.  Medicare Wellness Virtual Visit.  Visual/audio telehealth visit, UTA vital signs.   See social history for additional risk factors.         Objective:    Today's Vitals   05/31/22 1415  Weight: 168 lb (76.2 kg)  Height: '5\' 2"'$  (1.575 m)   Body mass index is 30.73 kg/m.     05/31/2022    2:17 PM 05/18/2021    9:11 AM 12/09/2019   12:47 PM 12/08/2018    1:08 PM 12/05/2017    9:03 AM  Advanced Directives  Does Patient Have a Medical Advance Directive? Yes Yes Yes Yes Yes  Type of Paramedic of [place];Living will [place];Living will Healthcare Power of [place];Living will [place];Living will  Does patient want to make changes to medical advance directive? No - Patient declined No - Patient declined No - Patient declined No - Patient declined No - Patient declined  Copy of [place] in Chart? Yes - validated most recent copy scanned in chart (See row information) Yes - validated most recent copy scanned in chart (See row information) No - copy requested No - copy requested No - copy requested    Current Medications (verified) Outpatient Encounter Medications as of 05/31/2022  Medication Sig   CALCIUM-VITAMINS C & D PO Take by mouth.   citalopram (CELEXA) 20 MG tablet TAKE 0.5 TABLETS (10 MG TOTAL) BY MOUTH.   Multiple Vitamin (MULTIVITAMIN) tablet Take 1 tablet by mouth daily.   No facility-administered encounter medications on file as of 05/31/2022.    Allergies (verified) Patient has no known allergies.   History: Past Medical History:  Diagnosis Date   Anemia    Arthritis    Asthma    as a child   Chicken pox    Colon polyps 2012   Depression    Hemorrhoids    Past Surgical History:  Procedure  Laterality Date   BREAST CYST ASPIRATION Left 90s   BREAST SURGERY  2007   aspiration   CARPAL TUNNEL RELEASE  2010   REFRACTIVE SURGERY     TONSILLECTOMY     Family History  Problem Relation Age of Onset   Alzheimer's disease Mother    Lung cancer Father    Breast cancer Neg Hx    Social History   Socioeconomic History   Marital status: Married    Spouse name: Not on file   Number of children: Not on file   Years of education: Not on file   Highest education level: Not on file  Occupational History   Not on file  Tobacco Use   Smoking status: Never   Smokeless tobacco: Never  Vaping Use   Vaping Use: Never used  Substance and Sexual Activity   Alcohol use: No   Drug use: No   Sexual activity: Yes  Other Topics Concern   Not on file  Social History Narrative   Not on file   Social Determinants of Health   Financial Resource Strain: Low Risk  (05/31/2022)   Overall Financial Resource Strain (CARDIA)    Difficulty of Paying Living Expenses: Not hard at all  Food Insecurity: No Food Insecurity (05/31/2022)   Hunger Vital Sign    Worried About Running Out of Food  in the Last Year: Never true    [place] in the Last Year: Never true  Transportation Needs: No Transportation Needs (05/31/2022)   PRAPARE - Hydrologist (Medical): No    Lack of Transportation (Non-Medical): No  Physical Activity: Insufficiently Active (05/31/2022)   Exercise Vital Sign    Days of Exercise per Week: 3 days    Minutes of Exercise per Session: 10 min  Stress: No Stress Concern Present (05/31/2022)   [place]    Feeling of Stress : Not at all  Social Connections: Unknown (05/27/2022)   Social Connection and Isolation Panel [NHANES]    Frequency of Communication with Friends and Family: More than three times a week    Frequency of Social Gatherings with Friends and Family: Once a week    Attends  Religious Services: Not on Advertising copywriter or Organizations: Yes    Attends Music therapist: More than 4 times per year    Marital Status: Married    Tobacco Counseling Counseling given: Not Answered   Clinical Intake:  Pre-visit preparation completed: Yes        Diabetes: No  How often do you need to have someone help you when you read instructions, pamphlets, or other written materials from your doctor or pharmacy?: 1 - Never  Interpreter Needed?: No      Activities of Daily Living    05/31/2022    2:18 PM 05/27/2022    8:42 AM  In your present state of health, do you have any difficulty performing the following activities:  Hearing? 0 0  Vision? 0 0  Difficulty concentrating or making decisions? 0 0  Walking or climbing stairs? 0 0  Dressing or bathing? 0 0  Doing errands, shopping? 0 0  Preparing Food and eating ? N N  Using the Toilet? N N  In the past six months, have you accidently leaked urine? N   Do you have problems with loss of bowel control? N N  Managing your Medications? N N  Managing your Finances? N N  Housekeeping or managing your Housekeeping? N N    Patient Care Team: Leone Haven, MD as PCP - General (Family Medicine)  Indicate any recent Medical Services you may have received from other than [facility] providers in the past year (date may be approximate).     Assessment:   This is a routine wellness examination for [name].  I connected with  MONNICA SALTSMAN on 05/31/22 by a audio enabled telemedicine application and verified that I am speaking with the correct person using two identifiers.  Patient Location: Home  Provider Location: Office/Clinic  I discussed the limitations of evaluation and management by telemedicine. The patient expressed understanding and agreed to proceed.   Hearing/Vision screen Hearing Screening - Comments:: Patient is able to hear conversational tones without difficulty. No  issues reported. Vision Screening - Comments:: Followed by [facility] (Dr. Jeni Salles) Wears corrective lenses to drive and read Lasik surgery They have regular follow up with the ophthalmologist    Dietary issues and exercise activities discussed:     Goals Addressed             This Visit's Progress    Follow up with Primary Care Provider       Monitor diet Stay active       Depression Screen  05/31/2022    2:18 PM 02/23/2022    8:40 AM 08/15/2021    8:07 AM 05/18/2021    9:10 AM 07/25/2020    8:25 AM 01/25/2020    9:04 AM 12/09/2019   12:42 PM  PHQ 2/9 Scores  PHQ - 2 Score 0 0 1 0 0 0 0  PHQ- 9 Score      6     Fall Risk    05/31/2022    2:17 PM 05/27/2022    8:42 AM 02/23/2022    8:40 AM 08/15/2021    8:07 AM 05/18/2021    9:12 AM  Fall Risk   Falls in the past year? 0 0 0 0 0  Number falls in past yr:   0 0 0  Injury with Fall?   0 0   Risk for fall due to :   No Fall Risks No Fall Risks   Follow up Falls evaluation completed;Falls prevention discussed  Falls evaluation completed Falls evaluation completed Falls evaluation completed    FALL RISK PREVENTION PERTAINING TO THE HOME: Home free of loose throw rugs in walkways, pet beds, electrical cords, etc? Yes  Adequate lighting in your home to reduce risk of falls? Yes   ASSISTIVE DEVICES UTILIZED TO PREVENT FALLS: Life alert? No  Use of a cane, walker or w/c? No   TIMED UP AND GO: Was the test performed? No .   Cognitive Function:    12/09/2019   12:50 PM 12/05/2017    9:23 AM  MMSE - Mini Mental State Exam  Not completed: Unable to complete   Orientation to time  5  Orientation to Place  5  Registration  3  Attention/ Calculation  5  Recall  3  Language- name 2 objects  2  Language- repeat  1  Language- follow 3 step command  3  Language- read & follow direction  1  Write a sentence  1  Copy design  1  Total score  30        05/31/2022    2:20 PM 05/18/2021    9:28 AM 12/08/2018     1:41 PM  6CIT Screen  What Year? 0 points 0 points 0 points  What month? 0 points 0 points 0 points  What time? 0 points 0 points 0 points  Count back from 20 0 points  0 points  Months in reverse 0 points 0 points 0 points  Repeat phrase 0 points  0 points  Total Score 0 points  0 points    Immunizations Immunization History  Administered Date(s) Administered   Fluad Quad(high Dose 65+) 01/25/2020, 02/08/2022   Influenza, High Dose Seasonal PF 02/07/2017, 02/06/2018, 02/11/2021   Influenza-Unspecified 01/28/2018, 02/14/2019   PFIZER(Purple Top)SARS-COV-2 Vaccination 12/31/2019, 01/21/2020   Pneumococcal Conjugate-13 02/12/2017   Pneumococcal Polysaccharide-23 11/23/2015   Tdap 05/28/2014   Zoster Recombinat (Shingrix) 04/25/2018, 09/05/2018   Screening Tests Health Maintenance  Topic Date Due   COVID-19 Vaccine (3 - Pfizer risk series) 06/16/2022 (Originally 02/18/2020)   COLONOSCOPY (Pts 45-38yr Insurance coverage will need to be confirmed)  11/23/2022   Medicare Annual Wellness (AWV)  06/01/2023   MAMMOGRAM  02/09/2024   DTaP/Tdap/Td (2 - Td or Tdap) 05/28/2024   Pneumonia Vaccine 72 Years old  Completed   INFLUENZA VACCINE  Completed   DEXA SCAN  Completed   Zoster Vaccines- Shingrix  Completed   HPV VACCINES  Aged Out    Health Maintenance There are no preventive care  reminders to display for this patient.  Lung Cancer Screening: (Low Dose CT Chest recommended if Age 70-80 years, 30 pack-year currently smoking OR have quit w/in 15years.) does not qualify.   Vision Screening: Recommended annual ophthalmology exams for early detection of glaucoma and other disorders of the eye.  Dental Screening: Recommended annual dental exams for proper oral hygiene.  Community Resource Referral / Chronic Care Management: CRR required this visit?  No   CCM required this visit?  No      Plan:     I have personally reviewed and noted the following in the patient's  chart:   Medical and social history Use of alcohol, tobacco or illicit drugs  Current medications and supplements including opioid prescriptions. Patient is not currently taking opioid prescriptions. Functional ability and status Nutritional status Physical activity Advanced directives List of other physicians Hospitalizations, surgeries, and ER visits in previous 12 months Vitals Screenings to include cognitive, depression, and falls Referrals and appointments  In addition, I have reviewed and discussed with patient certain preventive protocols, quality metrics, and best practice recommendations. A written personalized care plan for preventive services as well as general preventive health recommendations were provided to patient.     Leta Jungling, LPN   07/05/3336

## 2022-05-31 NOTE — Patient Instructions (Addendum)
Beth Hart , Thank you for taking time to come for your Medicare Wellness Visit. I appreciate your ongoing commitment to your health goals. Please review the following plan we discussed and let me know if I can assist you in the future.   These are the goals we discussed:  Goals      Follow up with Primary Care Provider     Monitor diet Stay active        This is a list of the screening recommended for you and due dates:  Health Maintenance  Topic Date Due   COVID-19 Vaccine (3 - Pfizer risk series) 06/16/2022*   Colon Cancer Screening  11/23/2022   Medicare Annual Wellness Visit  06/01/2023   Mammogram  02/09/2024   DTaP/Tdap/Td vaccine (2 - Td or Tdap) 05/28/2024   Pneumonia Vaccine  Completed   Flu Shot  Completed   DEXA scan (bone density measurement)  Completed   Zoster (Shingles) Vaccine  Completed   HPV Vaccine  Aged Out  *Topic was postponed. The date shown is not the original due date.    Advanced directives: on file  Conditions/risks identified: none new  Next appointment: Follow up in one year for your annual wellness visit    Preventive Care 65 Years and Older, Female Preventive care refers to lifestyle choices and visits with your health care provider that can promote health and wellness. What does preventive care include? A yearly physical exam. This is also called an annual well check. Dental exams once or twice a year. Routine eye exams. Ask your health care provider how often you should have your eyes checked. Personal lifestyle choices, including: Daily care of your teeth and gums. Regular physical activity. Eating a healthy diet. Avoiding tobacco and drug use. Limiting alcohol use. Practicing safe sex. Taking low-dose aspirin every day. Taking vitamin and mineral supplements as recommended by your health care provider. What happens during an annual well check? The services and screenings done by your health care provider during your annual well  check will depend on your age, overall health, lifestyle risk factors, and family history of disease. Counseling  Your health care provider may ask you questions about your: Alcohol use. Tobacco use. Drug use. Emotional well-being. Home and relationship well-being. Sexual activity. Eating habits. History of falls. Memory and ability to understand (cognition). Work and work Statistician. Reproductive health. Screening  You may have the following tests or measurements: Height, weight, and BMI. Blood pressure. Lipid and cholesterol levels. These may be checked every 5 years, or more frequently if you are over 54 years old. Skin check. Lung cancer screening. You may have this screening every year starting at age 42 if you have a 30-pack-year history of smoking and currently smoke or have quit within the past 15 years. Fecal occult blood test (FOBT) of the stool. You may have this test every year starting at age 61. Flexible sigmoidoscopy or colonoscopy. You may have a sigmoidoscopy every 5 years or a colonoscopy every 10 years starting at age 26. Hepatitis C blood test. Hepatitis B blood test. Sexually transmitted disease (STD) testing. Diabetes screening. This is done by checking your blood sugar (glucose) after you have not eaten for a while (fasting). You may have this done every 1-3 years. Bone density scan. This is done to screen for osteoporosis. You may have this done starting at age 58. Mammogram. This may be done every 1-2 years. Talk to your health care provider about how often you should  have regular mammograms. Talk with your health care provider about your test results, treatment options, and if necessary, the need for more tests. Vaccines  Your health care provider may recommend certain vaccines, such as: Influenza vaccine. This is recommended every year. Tetanus, diphtheria, and acellular pertussis (Tdap, Td) vaccine. You may need a Td booster every 10 years. Zoster  vaccine. You may need this after age 69. Pneumococcal 13-valent conjugate (PCV13) vaccine. One dose is recommended after age 39. Pneumococcal polysaccharide (PPSV23) vaccine. One dose is recommended after age 43. Talk to your health care provider about which screenings and vaccines you need and how often you need them. This information is not intended to replace advice given to you by your health care provider. Make sure you discuss any questions you have with your health care provider. Document Released: 05/13/2015 Document Revised: 01/04/2016 Document Reviewed: 02/15/2015 Elsevier Interactive Patient Education  2017 [place] Prevention in the Home Falls can cause injuries. They can happen to people of all ages. There are many things you can do to make your home safe and to help prevent falls. What can I do on the outside of my home? Regularly fix the edges of walkways and driveways and fix any cracks. Remove anything that might make you trip as you walk through a door, such as a raised step or threshold. Trim any bushes or trees on the path to your home. Use bright outdoor lighting. Clear any walking paths of anything that might make someone trip, such as rocks or tools. Regularly check to see if handrails are loose or broken. Make sure that both sides of any steps have handrails. Any raised decks and porches should have guardrails on the edges. Have any leaves, snow, or ice cleared regularly. Use sand or salt on walking paths during winter. Clean up any spills in your garage right away. This includes oil or grease spills. What can I do in the bathroom? Use night lights. Install grab bars by the toilet and in the tub and shower. Do not use towel bars as grab bars. Use non-skid mats or decals in the tub or shower. If you need to sit down in the shower, use a plastic, non-slip stool. Keep the floor dry. Clean up any water that spills on the floor as soon as it happens. Remove  soap buildup in the tub or shower regularly. Attach bath mats securely with double-sided non-slip rug tape. Do not have throw rugs and other things on the floor that can make you trip. What can I do in the bedroom? Use night lights. Make sure that you have a light by your bed that is easy to reach. Do not use any sheets or blankets that are too big for your bed. They should not hang down onto the floor. Have a firm chair that has side arms. You can use this for support while you get dressed. Do not have throw rugs and other things on the floor that can make you trip. What can I do in the kitchen? Clean up any spills right away. Avoid walking on wet floors. Keep items that you use a lot in easy-to-reach places. If you need to reach something above you, use a strong step stool that has a grab bar. Keep electrical cords out of the way. Do not use floor polish or wax that makes floors slippery. If you must use wax, use non-skid floor wax. Do not have throw rugs and other things on the floor  that can make you trip. What can I do with my stairs? Do not leave any items on the stairs. Make sure that there are handrails on both sides of the stairs and use them. Fix handrails that are broken or loose. Make sure that handrails are as long as the stairways. Check any carpeting to make sure that it is firmly attached to the stairs. Fix any carpet that is loose or worn. Avoid having throw rugs at the top or bottom of the stairs. If you do have throw rugs, attach them to the floor with carpet tape. Make sure that you have a light switch at the top of the stairs and the bottom of the stairs. If you do not have them, ask someone to add them for you. What else can I do to help prevent falls? Wear shoes that: Do not have high heels. Have rubber bottoms. Are comfortable and fit you well. Are closed at the toe. Do not wear sandals. If you use a stepladder: Make sure that it is fully opened. Do not climb a  closed stepladder. Make sure that both sides of the stepladder are locked into place. Ask someone to hold it for you, if possible. Clearly mark and make sure that you can see: Any grab bars or handrails. First and last steps. Where the edge of each step is. Use tools that help you move around (mobility aids) if they are needed. These include: Canes. Walkers. Scooters. Crutches. Turn on the lights when you go into a dark area. Replace any light bulbs as soon as they burn out. Set up your furniture so you have a clear path. Avoid moving your furniture around. If any of your floors are uneven, fix them. If there are any pets around you, be aware of where they are. Review your medicines with your doctor. Some medicines can make you feel dizzy. This can increase your chance of falling. Ask your doctor what other things that you can do to help prevent falls. This information is not intended to replace advice given to you by your health care provider. Make sure you discuss any questions you have with your health care provider. Document Released: 02/10/2009 Document Revised: 09/22/2015 Document Reviewed: 05/21/2014 Elsevier Interactive Patient Education  2017 [employer].

## 2022-08-09 DIAGNOSIS — N1832 Chronic kidney disease, stage 3b: Secondary | ICD-10-CM | POA: Diagnosis not present

## 2022-08-09 DIAGNOSIS — N2581 Secondary hyperparathyroidism of renal origin: Secondary | ICD-10-CM | POA: Diagnosis not present

## 2022-08-10 ENCOUNTER — Other Ambulatory Visit: Payer: Self-pay | Admitting: Family Medicine

## 2022-08-16 DIAGNOSIS — N2581 Secondary hyperparathyroidism of renal origin: Secondary | ICD-10-CM | POA: Diagnosis not present

## 2022-08-16 DIAGNOSIS — N1831 Chronic kidney disease, stage 3a: Secondary | ICD-10-CM | POA: Diagnosis not present

## 2022-08-16 DIAGNOSIS — R809 Proteinuria, unspecified: Secondary | ICD-10-CM | POA: Diagnosis not present

## 2022-08-17 ENCOUNTER — Ambulatory Visit (INDEPENDENT_AMBULATORY_CARE_PROVIDER_SITE_OTHER): Payer: Medicare HMO | Admitting: Family Medicine

## 2022-08-17 ENCOUNTER — Encounter: Payer: Self-pay | Admitting: Family Medicine

## 2022-08-17 VITALS — BP 120/82 | HR 69 | Temp 98.4°F | Ht 62.0 in | Wt 168.0 lb

## 2022-08-17 DIAGNOSIS — E785 Hyperlipidemia, unspecified: Secondary | ICD-10-CM | POA: Diagnosis not present

## 2022-08-17 DIAGNOSIS — Z780 Asymptomatic menopausal state: Secondary | ICD-10-CM | POA: Diagnosis not present

## 2022-08-17 DIAGNOSIS — Z0000 Encounter for general adult medical examination without abnormal findings: Secondary | ICD-10-CM | POA: Diagnosis not present

## 2022-08-17 DIAGNOSIS — R7303 Prediabetes: Secondary | ICD-10-CM | POA: Diagnosis not present

## 2022-08-17 LAB — LIPID PANEL
Cholesterol: 189 mg/dL (ref 0–200)
HDL: 60.4 mg/dL (ref >39.00)
LDL Cholesterol: 109 mg/dL — ABNORMAL HIGH (ref 0–99)
NonHDL: 128.76
Total CHOL/HDL Ratio: 3
Triglycerides: 98 mg/dL (ref 0.0–149.0)
VLDL: 19.6 mg/dL (ref 0.0–40.0)

## 2022-08-17 LAB — HEPATIC FUNCTION PANEL
ALT: 12 U/L (ref 0–35)
AST: 19 U/L (ref 0–37)
Albumin: 4.1 g/dL (ref 3.5–5.2)
Alkaline Phosphatase: 74 U/L (ref 39–117)
Bilirubin, Direct: 0.1 mg/dL (ref 0.0–0.3)
Total Bilirubin: 0.4 mg/dL (ref 0.2–1.2)
Total Protein: 6.4 g/dL (ref 6.0–8.3)

## 2022-08-17 LAB — HEMOGLOBIN A1C: Hgb A1c MFr Bld: 5.8 % (ref 4.6–6.5)

## 2022-08-17 NOTE — Patient Instructions (Addendum)
Nice to see you. Please call [phone] to schedule your bone density scan and mammogram for October of this year. Will contact you with your lab results.

## 2022-08-17 NOTE — Assessment & Plan Note (Signed)
Physical exam completed.  Encouraged healthy diet and exercise.  Discussed reducing soda intake and increasing activity though discussed in the very least she needs to keep things as is.  We will request colonoscopy records.  She will have her mammogram later this year.  She will have her DEXA scan at the same time.  She declines further COVID vaccinations.  She is aware those are available if she changes her mind.  Discussed getting the RSV vaccine once RSV season starts back later this year.  Lab work as outlined.

## 2022-08-17 NOTE — Progress Notes (Signed)
Marikay Alar, MD Phone: [phone]  Beth Hart is a 72 y.o. female who presents today for CPE.  Diet: no red meat, eating more fruits and vegetables, no sweets, rare fried foods, diet soda 1-2x/day Exercise: stays active Pap smear: aged out Colonoscopy: reports last year Mammogram: 02/08/22 negative Family history-  Colon cancer: no  Breast cancer: no  Ovarian cancer: no Menses: postmenopausal Vaccines-   Flu: out of season  Tetanus: UTD  Shingles: UTD  COVID19: x2  Pneumonia: UTD  RSV: due Hep C Screening: UTD Tobacco use: no Alcohol use: no Illicit Drug use: no Dentist: yes Ophthalmology: yes   Active Ambulatory Problems    Diagnosis Date Noted   Depression 11/23/2015   CKD (chronic kidney disease) stage 3, GFR 30-59 ml/min (HCC) 11/23/2015   Hyperlipidemia 11/23/2015   Obesity (BMI 30.0-34.9) 07/25/2020   Prediabetes 08/15/2021   Proteinuria 06/25/2019   Routine general medical examination at a health care facility 08/17/2022   Resolved Ambulatory Problems    Diagnosis Date Noted   Suspected COVID-19 virus infection 11/17/2018   Memory difficulty 01/25/2020   URI (upper respiratory infection) 07/25/2020   Weakness 07/25/2020   Vasovagal syncope 07/25/2020   Past Medical History:  Diagnosis Date   Anemia    Arthritis    Asthma    Chicken pox    Colon polyps 2012   Hemorrhoids     Family History  Problem Relation Age of Onset   Alzheimer's disease Mother    Lung cancer Father    Breast cancer Neg Hx     Social History   Socioeconomic History   Marital status: Married    Spouse name: Not on file   Number of children: Not on file   Years of education: Not on file   Highest education level: Not on file  Occupational History   Not on file  Tobacco Use   Smoking status: Never   Smokeless tobacco: Never  Vaping Use   Vaping Use: Never used  Substance and Sexual Activity   Alcohol use: No   Drug use: No   Sexual activity: Yes   Other Topics Concern   Not on file  Social History Narrative   Not on file   Social Determinants of Health   Financial Resource Strain: Low Risk  (05/31/2022)   Overall Financial Resource Strain (CARDIA)    Difficulty of Paying Living Expenses: Not hard at all  Food Insecurity: No Food Insecurity (05/31/2022)   Hunger Vital Sign    Worried About Running Out of Food in the Last Year: Never true    Ran Out of Food in the Last Year: Never true  Transportation Needs: No Transportation Needs (05/31/2022)   PRAPARE - Administrator, Civil Service (Medical): No    Lack of Transportation (Non-Medical): No  Physical Activity: Insufficiently Active (05/31/2022)   Exercise Vital Sign    Days of Exercise per Week: 3 days    Minutes of Exercise per Session: 10 min  Stress: No Stress Concern Present (05/31/2022)   [employer] of Occupational Health - Occupational Stress Questionnaire    Feeling of Stress : Not at all  Social Connections: Unknown (05/27/2022)   Social Connection and Isolation Panel [NHANES]    Frequency of Communication with Friends and Family: More than three times a week    Frequency of Social Gatherings with Friends and Family: Once a week    Attends Religious Services: Not on file    Active  Member of Clubs or Organizations: Yes    Attends Engineer, structural: More than 4 times per year    Marital Status: Married  Catering manager Violence: Not At Risk (05/31/2022)   Humiliation, Afraid, Rape, and Kick questionnaire    Fear of Current or Ex-Partner: No    Emotionally Abused: No    Physically Abused: No    Sexually Abused: No    ROS  General:  Negative for nexplained weight loss, fever Skin: Negative for new or changing mole, sore that won't heal HEENT: Negative for trouble hearing, trouble seeing, ringing in ears, mouth sores, hoarseness, change in voice, dysphagia. CV:  Negative for chest pain, dyspnea, edema, palpitations Resp: Negative for cough,  dyspnea, hemoptysis GI: Negative for nausea, vomiting, diarrhea, constipation, abdominal pain, melena, hematochezia. GU: Negative for dysuria, incontinence, urinary hesitance, hematuria, vaginal or penile discharge, polyuria, sexual difficulty, lumps in testicle or breasts MSK: Negative for muscle cramps or aches, joint pain or swelling Neuro: Negative for headaches, weakness, numbness, dizziness, passing out/fainting Psych: Negative for depression, anxiety, memory problems  Objective  Physical Exam Vitals:   08/17/22 0809  BP: 120/82  Pulse: 69  Temp: 98.4 F (36.9 C)  SpO2: 99%    BP Readings from Last 3 Encounters:  08/17/22 120/82  02/23/22 120/80  08/15/21 118/80   Wt Readings from Last 3 Encounters:  08/17/22 168 lb (76.2 kg)  05/31/22 168 lb (76.2 kg)  02/23/22 168 lb 12.8 oz (76.6 kg)    Physical Exam Constitutional:      General: She is not in acute distress.    Appearance: She is not diaphoretic.  HENT:     Head: Normocephalic and atraumatic.  Cardiovascular:     Rate and Rhythm: Normal rate and regular rhythm.     Heart sounds: Normal heart sounds.  Pulmonary:     Effort: Pulmonary effort is normal.     Breath sounds: Normal breath sounds.  Abdominal:     General: Bowel sounds are normal. There is no distension.     Palpations: Abdomen is soft.     Tenderness: There is no abdominal tenderness.  Musculoskeletal:     Right lower leg: No edema.     Left lower leg: No edema.  Lymphadenopathy:     Cervical: No cervical adenopathy.  Skin:    General: Skin is warm and dry.  Neurological:     Mental Status: She is alert.  Psychiatric:        Mood and Affect: Mood normal.      Assessment/Plan:   Routine general medical examination at a health care facility Assessment & Plan: Physical exam completed.  Encouraged healthy diet and exercise.  Discussed reducing soda intake and increasing activity though discussed in the very least she needs to keep  things as is.  We will request colonoscopy records.  She will have her mammogram later this year.  She will have her DEXA scan at the same time.  She declines further COVID vaccinations.  She is aware those are available if she changes her mind.  Discussed getting the RSV vaccine once RSV season starts back later this year.  Lab work as outlined.   Hyperlipidemia, unspecified hyperlipidemia type -     Hepatic function panel -     Lipid panel  Prediabetes -     Hemoglobin A1c  Postmenopausal estrogen deficiency -     DG Bone Density; Future    Return in about 1 year (around 08/17/2023)  for physical.   Marikay Alar, MD [facility]

## 2022-11-15 ENCOUNTER — Ambulatory Visit
Admission: RE | Admit: 2022-11-15 | Discharge: 2022-11-15 | Disposition: A | Discharge status: Home / Self Care | Payer: Medicare HMO | Source: Ambulatory Visit | Attending: Family Medicine | Admitting: Family Medicine

## 2022-11-15 DIAGNOSIS — Z780 Asymptomatic menopausal state: Secondary | ICD-10-CM | POA: Diagnosis not present

## 2022-11-15 DIAGNOSIS — M85851 Other specified disorders of bone density and structure, right thigh: Secondary | ICD-10-CM | POA: Diagnosis not present

## 2022-11-26 ENCOUNTER — Telehealth: Payer: Self-pay | Admitting: Family Medicine

## 2022-11-26 NOTE — Telephone Encounter (Addendum)
Patient called and would like to know more about treatment for osteopenia/osteoporosis.Marland Kitchen She would like to know the name of medication that she possibly would be taking.

## 2022-12-03 NOTE — Telephone Encounter (Signed)
My Chart message sent to the patient.

## 2022-12-05 ENCOUNTER — Other Ambulatory Visit: Payer: Self-pay | Admitting: Family Medicine

## 2022-12-05 DIAGNOSIS — Z1231 Encounter for screening mammogram for malignant neoplasm of breast: Secondary | ICD-10-CM

## 2022-12-20 ENCOUNTER — Telehealth: Payer: Self-pay | Admitting: Family Medicine

## 2022-12-20 NOTE — Telephone Encounter (Signed)
Put in Patient's medical record.

## 2022-12-20 NOTE — Telephone Encounter (Signed)
Pt called in to let Dr. Birdie Sons knows that she took her RSV shot today at [facility].

## 2023-01-16 ENCOUNTER — Encounter: Payer: Self-pay | Admitting: Nurse Practitioner

## 2023-01-16 ENCOUNTER — Telehealth (INDEPENDENT_AMBULATORY_CARE_PROVIDER_SITE_OTHER): Payer: Medicare HMO | Admitting: Nurse Practitioner

## 2023-01-16 VITALS — BP 117/73 | Ht 62.0 in | Wt 168.0 lb

## 2023-01-16 DIAGNOSIS — U071 COVID-19: Secondary | ICD-10-CM | POA: Diagnosis not present

## 2023-01-16 NOTE — Progress Notes (Signed)
MyChart Video Visit    Virtual Visit via Video Note   This visit type was conducted because this format is felt to be most appropriate for this patient at this time. Physical exam was limited by quality of the video and audio technology used for the visit. CMA was able to get the patient set up on a video visit.  Patient location: Home. Patient and provider in visit Provider location: Office  I discussed the limitations of evaluation and management by telemedicine and the availability of in person appointments. The patient expressed understanding and agreed to proceed.  Visit Date: 01/16/2023  Today's healthcare provider: Bethanie Dicker, NP     Subjective:    Patient ID: Beth Hart, female    DOB: 1951/01/30, 72 y.o.   MRN: [id number]  Chief Complaint  Patient presents with   Covid Positive    Tested positive yesterday afternoon,  started having symptoms yesterday. Fever, chills, body aches, stuffy nose, fever was 102 in the pm.     HPI Patient with symptoms that started yesterday. She tested positive for COVID at home yesterday. Her symptoms have been improving over the last day.  Respiratory illness:  Cough- Yes   Congestion-    Sinus- Yes, nasal   Chest- No  Post nasal drip- No  Sore throat- Irritated  Shortness of breath- No  Fever- Yes, Tmax 102  Fatigue/Myalgia- Yes Headache- Yes Nausea/Vomiting- No Taste disturbance- No  Smell disturbance- No  Covid exposure- No  Covid vaccination- x 2  Flu vaccination- UTD  Medications- Tylenol, Cough and Cold  Past Medical History:  Diagnosis Date   Anemia    Arthritis    Asthma    as a child   Chicken pox    Colon polyps 2012   Depression    Hemorrhoids     Past Surgical History:  Procedure Laterality Date   BREAST CYST ASPIRATION Left 90s   BREAST SURGERY  2007   aspiration   CARPAL TUNNEL RELEASE  2010   REFRACTIVE SURGERY     TONSILLECTOMY      Family History  Problem Relation Age of Onset    Alzheimer's disease Mother    Lung cancer Father    Breast cancer Neg Hx     Social History   Socioeconomic History   Marital status: Married    Spouse name: Not on file   Number of children: Not on file   Years of education: Not on file   Highest education level: Not on file  Occupational History   Not on file  Tobacco Use   Smoking status: Never   Smokeless tobacco: Never  Vaping Use   Vaping status: Never Used  Substance and Sexual Activity   Alcohol use: No   Drug use: No   Sexual activity: Yes  Other Topics Concern   Not on file  Social History Narrative   Not on file   Social Determinants of Health   Financial Resource Strain: Low Risk  (05/31/2022)   Overall Financial Resource Strain (CARDIA)    Difficulty of Paying Living Expenses: Not hard at all  Food Insecurity: No Food Insecurity (05/31/2022)   Hunger Vital Sign    Worried About Running Out of Food in the Last Year: Never true    Ran Out of Food in the Last Year: Never true  Transportation Needs: No Transportation Needs (05/31/2022)   PRAPARE - Administrator, Civil Service (Medical): No  Lack of Transportation (Non-Medical): No  Physical Activity: Insufficiently Active (05/31/2022)   Exercise Vital Sign    Days of Exercise per Week: 3 days    Minutes of Exercise per Session: 10 min  Stress: No Stress Concern Present (05/31/2022)   [employer] of Occupational Health - Occupational Stress Questionnaire    Feeling of Stress : Not at all  Social Connections: Unknown (05/27/2022)   Social Connection and Isolation Panel [NHANES]    Frequency of Communication with Friends and Family: More than three times a week    Frequency of Social Gatherings with Friends and Family: Once a week    Attends Religious Services: Not on Insurance claims handler of Clubs or Organizations: Yes    Attends Banker Meetings: More than 4 times per year    Marital Status: Married  Catering manager Violence:  Not At Risk (05/31/2022)   Humiliation, Afraid, Rape, and Kick questionnaire    Fear of Current or Ex-Partner: No    Emotionally Abused: No    Physically Abused: No    Sexually Abused: No    Outpatient Medications Prior to Visit  Medication Sig Dispense Refill   Calcium Carb-Cholecalciferol (CALCIUM 1000 + D PO) Take by mouth.     Cholecalciferol (VITAMIN D-3) 125 MCG (5000 UT) TABS Take by mouth.     citalopram (CELEXA) 20 MG tablet TAKE 0.5 TABLETS (10 MG TOTAL) BY MOUTH. 45 tablet 3   Multiple Vitamin (MULTIVITAMIN) tablet Take 1 tablet by mouth daily.     No facility-administered medications prior to visit.    No Known Allergies  ROS See HPI    Objective:    Physical Exam  BP 117/73 Comment: pt reported  Ht 5\' 2"  (1.575 m)   Wt 168 lb (76.2 kg)   BMI 30.73 kg/m  Wt Readings from Last 3 Encounters:  01/16/23 168 lb (76.2 kg)  08/17/22 168 lb (76.2 kg)  05/31/22 168 lb (76.2 kg)   GENERAL: alert, oriented, appears well and in no acute distress   HEENT: conjunttiva clear, no obvious abnormalities on inspection of external nose and ears   NECK: normal movements of the head and neck   LUNGS: on inspection no signs of respiratory distress, breathing rate appears normal, no obvious gross SOB, gasping or wheezing   CV: no obvious cyanosis   MS: moves all visible extremities without noticeable abnormality   PSYCH/NEURO: pleasant and cooperative, no obvious depression or anxiety, speech and thought processing grossly intact     Assessment & Plan:   Problem List Items Addressed This Visit       Other   Positive self-administered antigen test for COVID-19 - Primary    Counseled on antivirals. Patient agreeable to over the counter symptomatic treatment. She has been getting relief using OTC cough and cold medication and Tylenol. She can continue these as well as nasal spray to help with congestion. Advised adequate fluid intake. Counseled on quarantine protocol. Return  precautions given to patient.        I am having Beth Essex. Mecum "Darel Hong" maintain her multivitamin, citalopram, Vitamin D-3, and Calcium Carb-Cholecalciferol (CALCIUM 1000 + D PO).  No orders of the defined types were placed in this encounter.   I discussed the assessment and treatment plan with the patient. The patient was provided an opportunity to ask questions and all were answered. The patient agreed with the plan and demonstrated an understanding of the instructions.   The patient was  advised to call back or seek an in-person evaluation if the symptoms worsen or if the condition fails to improve as anticipated.   Bethanie Dicker, NP [facility] [employer] [facility] [phone] (phone) [phone] (fax)  [facility]

## 2023-01-16 NOTE — Assessment & Plan Note (Signed)
Counseled on antivirals. Patient agreeable to over the counter symptomatic treatment. She has been getting relief using OTC cough and cold medication and Tylenol. She can continue these as well as nasal spray to help with congestion. Advised adequate fluid intake. Counseled on quarantine protocol. Return precautions given to patient.

## 2023-02-14 ENCOUNTER — Ambulatory Visit
Admission: RE | Admit: 2023-02-14 | Discharge: 2023-02-14 | Disposition: A | Discharge status: Home / Self Care | Payer: Medicare HMO | Source: Ambulatory Visit | Attending: Family Medicine | Admitting: Family Medicine

## 2023-02-14 DIAGNOSIS — Z1231 Encounter for screening mammogram for malignant neoplasm of breast: Secondary | ICD-10-CM | POA: Insufficient documentation

## 2023-05-13 DIAGNOSIS — Z0100 Encounter for examination of eyes and vision without abnormal findings: Secondary | ICD-10-CM | POA: Diagnosis not present

## 2023-05-13 DIAGNOSIS — H2513 Age-related nuclear cataract, bilateral: Secondary | ICD-10-CM | POA: Diagnosis not present

## 2023-05-15 DIAGNOSIS — Z0100 Encounter for examination of eyes and vision without abnormal findings: Secondary | ICD-10-CM | POA: Diagnosis not present

## 2023-06-03 ENCOUNTER — Ambulatory Visit (INDEPENDENT_AMBULATORY_CARE_PROVIDER_SITE_OTHER): Payer: Medicare HMO | Admitting: *Deleted

## 2023-06-03 ENCOUNTER — Encounter: Payer: Self-pay | Admitting: Family Medicine

## 2023-06-03 VITALS — Ht 62.0 in | Wt 163.0 lb

## 2023-06-03 DIAGNOSIS — Z0000 Encounter for general adult medical examination without abnormal findings: Secondary | ICD-10-CM

## 2023-06-03 NOTE — Telephone Encounter (Signed)
Some of the patients BPs are higher than I would like even at home. I would like her BP to be <130/80 consistently. Please offer her a visit to discuss her BP.      The 10-year ASCVD risk score (Arnett DK, et al., 2019) is: 11.6%   Values used to calculate the score:     Age: 73 years     Sex: Female     Is Non-Hispanic African American: No     Diabetic: No     Tobacco smoker: No     Systolic Blood Pressure: 129 mmHg     Is BP treated: No     HDL Cholesterol: 60.4 mg/dL     Total Cholesterol: 189 mg/dL

## 2023-06-03 NOTE — Telephone Encounter (Signed)
 Noted

## 2023-06-03 NOTE — Progress Notes (Signed)
Subjective:   Beth Hart is a 73 y.o. female who presents for Medicare Annual (Subsequent) preventive examination.  Visit Complete: Virtual I connected with  Beth Hart on 06/03/23 by a audio enabled telemedicine application and verified that I am speaking with the correct person using two identifiers. This patient declined Interactive audio and Acupuncturist. Therefore the visit was completed with audio only.   Patient Location: Home  Provider Location: Office/Clinic  I discussed the limitations of evaluation and management by telemedicine. The patient expressed understanding and agreed to proceed.  Vital Signs: Because this visit was a virtual/telehealth visit, some criteria may be missing or patient reported. Any vitals not documented were not able to be obtained and vitals that have been documented are patient reported.  Patient Medicare AWV questionnaire was completed by the patient on 05/28/23; I have confirmed that all information answered by patient is correct and no changes since this date.  Cardiac Risk Factors include: advanced age (>34men, >3 women);dyslipidemia     Objective:    Today's Vitals   06/03/23 1348  Weight: 163 lb (73.9 kg)  Height: 5\' 2"  (1.575 m)   Body mass index is 29.81 kg/m.     06/03/2023    1:58 PM 05/31/2022    2:17 PM 05/18/2021    9:11 AM 12/09/2019   12:47 PM 12/08/2018    1:08 PM 12/05/2017    9:03 AM  Advanced Directives  Does Patient Have a Medical Advance Directive? Yes Yes Yes Yes Yes Yes  Type of Estate agent of Beth Hart;Living will Healthcare Power of Beth Hart;Living will Healthcare Power of Beth Hart;Living will Healthcare Power of Beth Hart of Beth Hart;Living will Healthcare Power of Beth Hart;Living will  Does patient want to make changes to medical advance directive? No - Patient declined No - Patient declined No - Patient declined No - Patient declined No - Patient declined  No - Patient declined  Copy of Healthcare Power of Attorney in Chart? Yes - validated most recent copy scanned in chart (See row information) Yes - validated most recent copy scanned in chart (See row information) Yes - validated most recent copy scanned in chart (See row information) No - copy requested No - copy requested No - copy requested    Current Medications (verified) Outpatient Encounter Medications as of 06/03/2023  Medication Sig   Calcium Carb-Cholecalciferol (CALCIUM 1000 + D PO) Take by mouth.   Cholecalciferol (VITAMIN D-3) 125 MCG (5000 UT) TABS Take by mouth.   citalopram (CELEXA) 20 MG tablet TAKE 0.5 TABLETS (10 MG TOTAL) BY MOUTH.   Multiple Vitamin (MULTIVITAMIN) tablet Take 1 tablet by mouth daily.   No facility-administered encounter medications on file as of 06/03/2023.    Allergies (verified) Patient has no known allergies.   History: Past Medical History:  Diagnosis Date   Anemia    Arthritis    Asthma    as a child   Chicken pox    Colon polyps 2012   Depression    Hemorrhoids    Past Surgical History:  Procedure Laterality Date   BREAST CYST ASPIRATION Left 90s   BREAST SURGERY  2007   aspiration   CARPAL TUNNEL RELEASE  2010   REFRACTIVE SURGERY     TONSILLECTOMY     Family History  Problem Relation Age of Onset   Alzheimer's disease Mother    Lung cancer Father    Breast cancer Neg Hx    Social History   Socioeconomic  History   Marital status: Married    Spouse name: Not on file   Number of children: Not on file   Years of education: Not on file   Highest education level: Not on file  Occupational History   Not on file  Tobacco Use   Smoking status: Never   Smokeless tobacco: Never  Vaping Use   Vaping status: Never Used  Substance and Sexual Activity   Alcohol use: No   Drug use: No   Sexual activity: Yes  Other Topics Concern   Not on file  Social History Narrative   married   Social Drivers of Research scientist (physical sciences) Strain: Low Risk  (06/03/2023)   Overall Financial Resource Strain (CARDIA)    Difficulty of Paying Living Expenses: Not hard at all  Food Insecurity: No Food Insecurity (06/03/2023)   Hunger Vital Sign    Worried About Running Out of Food in the Last Year: Never true    Ran Out of Food in the Last Year: Never true  Transportation Needs: No Transportation Needs (06/03/2023)   PRAPARE - Administrator, Civil Service (Medical): No    Lack of Transportation (Non-Medical): No  Physical Activity: Insufficiently Active (06/03/2023)   Exercise Vital Sign    Days of Exercise per Week: 3 days    Minutes of Exercise per Session: 10 min  Stress: No Stress Concern Present (06/03/2023)   [employer] of Occupational Health - Occupational Stress Questionnaire    Feeling of Stress : Not at all  Social Connections: Socially Integrated (06/03/2023)   Social Connection and Isolation Panel [NHANES]    Frequency of Communication with Friends and Family: More than three times a week    Frequency of Social Gatherings with Friends and Family: Three times a week    Attends Religious Services: More than 4 times per year    Active Member of Clubs or Organizations: Yes    Attends Engineer, structural: More than 4 times per year    Marital Status: Married    Tobacco Counseling Counseling given: Not Answered   Clinical Intake:  Pre-visit preparation completed: Yes  Pain : No/denies pain     BMI - recorded: 29.81 Nutritional Status: BMI 25 -29 Overweight Nutritional Risks: None Diabetes: No  How often do you need to have someone help you when you read instructions, pamphlets, or other written materials from your doctor or pharmacy?: 1 - Never  Interpreter Needed?: No  Information entered by :: R. Kade Rickels LPN   Activities of Daily Living    05/28/2023    4:26 PM  In your present state of health, do you have any difficulty performing the following activities:  Hearing? 0   Vision? 0  Difficulty concentrating or making decisions? 0  Walking or climbing stairs? 0  Dressing or bathing? 0  Doing errands, shopping? 0  Preparing Food and eating ? N  Using the Toilet? N  In the past six months, have you accidently leaked urine? Y  Do you have problems with loss of bowel control? N  Managing your Medications? N  Managing your Finances? N  Housekeeping or managing your Housekeeping? N    Patient Care Team: Glori Luis, MD as PCP - General (Family Medicine)  Indicate any recent Medical Services you may have received from other than [facility] providers in the past year (date may be approximate).     Assessment:   This is a routine wellness examination  for [place].  Hearing/Vision screen Hearing Screening - Comments:: No issues Vision Screening - Comments:: glasses   Goals Addressed             This Visit's Progress    Patient Stated       Wants to drink more water and walk every day       Depression Screen    06/03/2023    1:53 PM 01/16/2023    1:29 PM 08/17/2022    8:11 AM 05/31/2022    2:18 PM 02/23/2022    8:40 AM 08/15/2021    8:07 AM 05/18/2021    9:10 AM  PHQ 2/9 Scores  PHQ - 2 Score 0 0 0 0 0 1 0  PHQ- 9 Score 0 0 0        Fall Risk    05/28/2023    4:26 PM 08/17/2022    8:11 AM 05/31/2022    2:17 PM 05/27/2022    8:42 AM 02/23/2022    8:40 AM  Fall Risk   Falls in the past year? 0 0 0 0 0  Number falls in past yr: 0 0   0  Injury with Fall? 0 0   0  Risk for fall due to : No Fall Risks No Fall Risks   No Fall Risks  Follow up Falls prevention discussed;Falls evaluation completed Falls evaluation completed Falls evaluation completed;Falls prevention discussed  Falls evaluation completed    MEDICARE RISK AT HOME: Medicare Risk at Home Any stairs in or around the home?: (Patient-Rptd) Yes If so, are there any without handrails?: (Patient-Rptd) No Home free of loose throw rugs in walkways, pet beds, electrical cords, etc?:  (Patient-Rptd) Yes Adequate lighting in your home to reduce risk of falls?: (Patient-Rptd) Yes Life alert?: (Patient-Rptd) No Use of a cane, walker or w/c?: (Patient-Rptd) No Grab bars in the bathroom?: (Patient-Rptd) No Shower chair or bench in shower?: (Patient-Rptd) Yes Elevated toilet seat or a handicapped toilet?: (Patient-Rptd) Yes   Cognitive Function:    12/09/2019   12:50 PM 12/05/2017    9:23 AM  MMSE - Mini Mental State Exam  Not completed: Unable to complete   Orientation to time  5  Orientation to Place  5  Registration  3  Attention/ Calculation  5  Recall  3  Language- name 2 objects  2  Language- repeat  1  Language- follow 3 step command  3  Language- read & follow direction  1  Write a sentence  1  Copy design  1  Total score  30        06/03/2023    1:58 PM 05/31/2022    2:20 PM 05/18/2021    9:28 AM 12/08/2018    1:41 PM  6CIT Screen  What Year? 0 points 0 points 0 points 0 points  What month? 0 points 0 points 0 points 0 points  What time? 0 points 0 points 0 points 0 points  Count back from 20 0 points 0 points  0 points  Months in reverse 0 points 0 points 0 points 0 points  Repeat phrase 2 points 0 points  0 points  Total Score 2 points 0 points  0 points    Immunizations Immunization History  Administered Date(s) Administered   Fluad Quad(high Dose 65+) 01/25/2020, 02/08/2022   Influenza, High Dose Seasonal PF 02/07/2017, 02/06/2018, 02/11/2021   Influenza-Unspecified 01/28/2018, 02/14/2019   PFIZER(Purple Top)SARS-COV-2 Vaccination 12/31/2019, 01/21/2020   Pneumococcal Conjugate-13 02/12/2017   Pneumococcal  Polysaccharide-23 11/23/2015   Respiratory Syncytial Virus Vaccine,Recomb Aduvanted(Arexvy) 12/20/2022   Tdap 05/28/2014   Zoster Recombinant(Shingrix) 04/25/2018, 09/05/2018    TDAP status: Up to date  Flu Vaccine status: Up to date  Pneumococcal vaccine status: Up to date  Covid-19 vaccine status: Information provided on how to  obtain vaccines.   Qualifies for Shingles Vaccine? Yes   Zostavax completed No   Shingrix Completed?: Yes  Screening Tests Health Maintenance  Topic Date Due   COVID-19 Vaccine (3 - Pfizer risk series) 02/18/2020   Colonoscopy  11/23/2022   INFLUENZA VACCINE  11/29/2022   Medicare Annual Wellness (AWV)  06/01/2023   DTaP/Tdap/Td (2 - Td or Tdap) 05/28/2024   MAMMOGRAM  02/13/2025   Pneumonia Vaccine 69+ Years old  Completed   DEXA SCAN  Completed   Zoster Vaccines- Shingrix  Completed   HPV VACCINES  Aged Out    Health Maintenance  Health Maintenance Due  Topic Date Due   COVID-19 Vaccine (3 - Pfizer risk series) 02/18/2020   Colonoscopy  11/23/2022   INFLUENZA VACCINE  11/29/2022   Medicare Annual Wellness (AWV)  06/01/2023    Colorectal cancer screening: No longer required. age  Mammogram status: Completed 01/2023. Repeat every year  Bone Density status: Completed 10/2022. Results reflect: Bone density results: OSTEOPENIA. Repeat every 2 years.  Lung Cancer Screening: (Low Dose CT Chest recommended if Age 49-80 years, 20 pack-year currently smoking OR have quit w/in 15years.) does not qualify.     Additional Screening:  Hepatitis C Screening: does not qualify; Completed Gives blood regulaly  Vision Screening: Recommended annual ophthalmology exams for early detection of glaucoma and other disorders of the eye. Is the patient up to date with their annual eye exam?  Yes  Who is the provider or what is the name of the office in which the patient attends annual eye exams? [facility] If pt is not established with a provider, would they like to be referred to a provider to establish care? No .   Dental Screening: Recommended annual dental exams for proper oral hygiene   Community Resource Referral / Chronic Care Management: CRR required this visit?  No   CCM required this visit?  No     Plan:     I have personally reviewed and noted the following in the  patient's chart:   Medical and social history Use of alcohol, tobacco or illicit drugs  Current medications and supplements including opioid prescriptions. Patient is not currently taking opioid prescriptions. Functional ability and status Nutritional status Physical activity Advanced directives List of other physicians Hospitalizations, surgeries, and ER visits in previous 12 months Vitals Screenings to include cognitive, depression, and falls Referrals and appointments  In addition, I have reviewed and discussed with patient certain preventive protocols, quality metrics, and best practice recommendations. A written personalized care plan for preventive services as well as general preventive health recommendations were provided to patient.     Sydell Axon, LPN   05/05/1094   After Visit Summary: (MyChart) Due to this being a telephonic visit, the after visit summary with patients personalized plan was offered to patient via MyChart   Nurse Notes: None

## 2023-06-03 NOTE — Patient Instructions (Signed)
Beth Hart , Thank you for taking time to come for your Medicare Wellness Visit. I appreciate your ongoing commitment to your health goals. Please review the following plan we discussed and let me know if I can assist you in the future.   Referrals/Orders/Follow-Ups/Clinician Recommendations: None  This is a list of the screening recommended for you and due dates:  Health Maintenance  Topic Date Due   COVID-19 Vaccine (3 - Pfizer risk series) 02/18/2020   Colon Cancer Screening  11/23/2022   DTaP/Tdap/Td vaccine (2 - Td or Tdap) 05/28/2024   Medicare Annual Wellness Visit  06/02/2024   Mammogram  02/13/2025   Pneumonia Vaccine  Completed   Flu Shot  Completed   DEXA scan (bone density measurement)  Completed   Zoster (Shingles) Vaccine  Completed   HPV Vaccine  Aged Out    Advanced directives: (In Chart) A copy of your advanced directives are scanned into your chart should your provider ever need it.  Next Medicare Annual Wellness Visit scheduled for next year: Yes 06/10/24 @ 1:40

## 2023-07-19 ENCOUNTER — Telehealth: Payer: Self-pay

## 2023-07-19 NOTE — Telephone Encounter (Signed)
 Copied from CRM [id number]. Topic: Appointments - Transfer of Care >> Jul 19, 2023 10:03 AM Adaysia C wrote: Pt is requesting to transfer FROM: Marikay Alar, MD Pt is requesting to transfer TO:  Skip Estimable, MD Reason for requested transfer: Current provider is no longer with the clinic It is the responsibility of the team the patient would like to transfer to [facility]) to reach out to the patient if for any reason this transfer is not acceptable.  I called patient and scheduled a transfer of care appointment for her with Dr. Skip Estimable.

## 2023-08-21 ENCOUNTER — Encounter: Payer: Medicare HMO | Admitting: Family Medicine

## 2023-09-11 ENCOUNTER — Telehealth: Payer: Self-pay

## 2023-09-11 ENCOUNTER — Other Ambulatory Visit: Payer: Self-pay

## 2023-09-11 MED ORDER — CITALOPRAM HYDROBROMIDE 20 MG PO TABS: 20.0000 mg | ORAL_TABLET | Freq: Every day | ORAL | 0 refills | Status: DC

## 2023-09-11 NOTE — Telephone Encounter (Signed)
 Noted Kelly did refill already!

## 2023-09-11 NOTE — Telephone Encounter (Signed)
Rx sent to Pharmacy

## 2023-09-11 NOTE — Telephone Encounter (Unsigned)
 Copied from CRM [id number]. Topic: Clinical - Medication Refill >> Sep 11, 2023 10:10 AM Albertha Alosa wrote: Medication: citalopram  (CELEXA ) 20 MG tablet  Has the patient contacted their pharmacy? Yes (Agent: If no, request that the patient contact the pharmacy for the refill. If patient does not wish to contact the pharmacy document the reason why and proceed with request.) (Agent: If yes, when and what did the pharmacy advise?)  This is the patient's preferred pharmacy:  [id number] [place], [place] - [address] [place] [address] Phone: [phone] Fax: [phone]  Is this the correct pharmacy for this prescription? Yes If no, delete pharmacy and type the correct one.   Has the prescription been filled recently? No  Is the patient out of the medication? Yes  Has the patient been seen for an appointment in the last year OR does the patient have an upcoming appointment? Yes  Can we respond through MyChart? Yes  Agent: Please be advised that Rx refills may take up to 3 business days. We ask that you follow-up with your pharmacy.

## 2023-09-16 ENCOUNTER — Encounter: Payer: Self-pay | Admitting: Emergency Medicine

## 2023-09-16 ENCOUNTER — Telehealth: Payer: Self-pay

## 2023-09-16 ENCOUNTER — Other Ambulatory Visit: Payer: Self-pay

## 2023-09-16 ENCOUNTER — Emergency Department

## 2023-09-16 ENCOUNTER — Emergency Department
Admission: EM | Admit: 2023-09-16 | Discharge: 2023-09-16 | Disposition: A | Discharge status: Home / Self Care | Attending: Emergency Medicine | Admitting: Emergency Medicine

## 2023-09-16 DIAGNOSIS — D649 Anemia, unspecified: Secondary | ICD-10-CM | POA: Diagnosis not present

## 2023-09-16 DIAGNOSIS — C786 Secondary malignant neoplasm of retroperitoneum and peritoneum: Secondary | ICD-10-CM

## 2023-09-16 DIAGNOSIS — R1032 Left lower quadrant pain: Secondary | ICD-10-CM | POA: Diagnosis present

## 2023-09-16 DIAGNOSIS — N183 Chronic kidney disease, stage 3 unspecified: Secondary | ICD-10-CM | POA: Diagnosis not present

## 2023-09-16 LAB — CBC
HCT: 29.1 % — ABNORMAL LOW (ref 36.0–46.0)
Hemoglobin: 9.3 g/dL — ABNORMAL LOW (ref 12.0–15.0)
MCH: 28.3 pg (ref 26.0–34.0)
MCHC: 32 g/dL (ref 30.0–36.0)
MCV: 88.4 fL (ref 80.0–100.0)
Platelets: 344 10*3/uL (ref 150–400)
RBC: 3.29 MIL/uL — ABNORMAL LOW (ref 3.87–5.11)
RDW: 16.1 % — ABNORMAL HIGH (ref 11.5–15.5)
WBC: 9.8 10*3/uL (ref 4.0–10.5)
nRBC: 0 % (ref 0.0–0.2)

## 2023-09-16 LAB — COMPREHENSIVE METABOLIC PANEL WITH GFR
ALT: 19 U/L (ref 0–44)
AST: 38 U/L (ref 15–41)
Albumin: 3.4 g/dL — ABNORMAL LOW (ref 3.5–5.0)
Alkaline Phosphatase: 66 U/L (ref 38–126)
Anion gap: 11 (ref 5–15)
BUN: 27 mg/dL — ABNORMAL HIGH (ref 8–23)
CO2: 22 mmol/L (ref 22–32)
Calcium: 9.2 mg/dL (ref 8.9–10.3)
Chloride: 104 mmol/L (ref 98–111)
Creatinine, Ser: 1.52 mg/dL — ABNORMAL HIGH (ref 0.44–1.00)
GFR, Estimated: 36 mL/min — ABNORMAL LOW (ref >60)
Glucose, Bld: 112 mg/dL — ABNORMAL HIGH (ref 70–99)
Potassium: 4.3 mmol/L (ref 3.5–5.1)
Sodium: 137 mmol/L (ref 135–145)
Total Bilirubin: 0.6 mg/dL (ref 0.0–1.2)
Total Protein: 6.7 g/dL (ref 6.5–8.1)

## 2023-09-16 LAB — URINALYSIS, ROUTINE W REFLEX MICROSCOPIC
Bacteria, UA: NONE SEEN
Bilirubin Urine: NEGATIVE
Glucose, UA: NEGATIVE mg/dL
Hgb urine dipstick: NEGATIVE
Ketones, ur: NEGATIVE mg/dL
Leukocytes,Ua: NEGATIVE
Nitrite: NEGATIVE
Protein, ur: 100 mg/dL — AB
Specific Gravity, Urine: 1.018 (ref 1.005–1.030)
pH: 5 (ref 5.0–8.0)

## 2023-09-16 LAB — LIPASE, BLOOD: Lipase: 26 U/L (ref 11–51)

## 2023-09-16 MED ORDER — IOHEXOL 300 MG/ML  SOLN
100.0000 mL | Freq: Once | INTRAMUSCULAR | Status: AC | PRN
  Administered 2023-09-16: 100 mL via INTRAVENOUS

## 2023-09-16 MED ORDER — SODIUM CHLORIDE 0.9 % IV BOLUS: 1000.0000 mL | Freq: Once | INTRAVENOUS | Status: DC

## 2023-09-16 NOTE — ED Provider Notes (Signed)
 [facility] Provider Note    Event Date/Time   First MD Initiated Contact with Patient 09/16/23 [phone]     (approximate)   History   Abdominal Pain   HPI  Beth Hart is a 73 y.o. female with CKD 3 who comes in with left lower quadrant pain for the past 5 days patient does report a temperature of 100 this morning.  Patient reports about 5 days of left lower quadrant pain.  She reports having this previously but typically just goes away where this has been more persistent.  She denies any bleeding in her stool, states that it is worse with certain movements.  No nausea no vomiting.  Did have a bowel movement this morning that was normal.  She did report having some issues with constipation while on iron supplementation but states that over the past month she has not been taking the iron so this has been resolved.  She denies any chest pain, shortness of breath or other concerns.  Physical Exam   Triage Vital Signs: ED Triage Vitals  Encounter Vitals Group     BP 09/16/23 0956 131/64     Systolic BP Percentile --      Diastolic BP Percentile --      Pulse Rate 09/16/23 0956 95     Resp 09/16/23 0956 16     Temp 09/16/23 0956 98.7 F (37.1 C)     Temp Source 09/16/23 0956 Oral     SpO2 09/16/23 0956 100 %     Weight 09/16/23 0955 162 lb (73.5 kg)     Height 09/16/23 0955 5\' 2"  (1.575 m)     Head Circumference --      Peak Flow --      Pain Score 09/16/23 0955 4     Pain Loc --      Pain Education --      Exclude from Growth Chart --     Most recent vital signs: Vitals:   09/16/23 0956  BP: 131/64  Pulse: 95  Resp: 16  Temp: 98.7 F (37.1 C)  SpO2: 100%     General: Awake, no distress.  CV:  Good peripheral perfusion.  Resp:  Normal effort.  Abd:  No distention.  The left lower quadrant without any rebound, guarding Other:     ED Results / Procedures / Treatments   Labs (all labs ordered are listed, but only abnormal results are  displayed) Labs Reviewed  LIPASE, BLOOD  COMPREHENSIVE METABOLIC PANEL WITH GFR  CBC  URINALYSIS, ROUTINE W REFLEX MICROSCOPIC     RADIOLOGY I have reviewed the ct personally and interpreted + mets    PROCEDURES:  Critical Care performed: No  Procedures   MEDICATIONS ORDERED IN ED: Medications - No data to display   IMPRESSION / MDM / ASSESSMENT AND PLAN / ED COURSE  I reviewed the triage vital signs and the nursing notes.   Patient's presentation is most consistent with acute presentation with potential threat to life or bodily function.   Patient comes in with concerns for left lower quadrant pain.  We discussed pros and cons of CT imaging with or without contrast.  If GFR which is within range she is okay with proceeding with CT scan with contrast.  She denies wanting anything for pain.  Differential includes diverticulitis, perforation, abscess.  CMP shows slight elevation of creatinine at 1.5.  Lipase normal CBC shows hemoglobin of 9.3. Last hemoglobin checked 1 month ago  was 11.5 and she reports she was previously on iron supplementation but had to discontinue secondary to symptoms.  She can follow this up with her oncology team.  No indication for transfusion today no active GI bleeding denies any vomiting blood, black stools. Offered rectal exam but she states had brown stool today no concerns for GI bleed.    Discussed with patient her abnormal CT imaging and concern for metastatic cancer.  We discussed admission versus discharge with outpatient follow-up and she felt comfortable with outpatient follow-up.  Discussed with Dr. Wilhelmenia Harada to help facilitate this.  Discussed if she would want this MRI, CT imaging but she stated that she really just needed the biopsy and they will work with IR to set this up.  She can see her earlier this week.  Offered patient pain medication but she declined.      FINAL CLINICAL IMPRESSION(S) / ED DIAGNOSES   Final diagnoses:  Peritoneal  carcinomatosis (HCC)  Anemia, unspecified type     Rx / DC Orders   ED Discharge Orders          Ordered    Ambulatory referral to Hematology / Oncology        09/16/23 1311             Note:  This document was prepared using Dragon voice recognition software and may include unintentional dictation errors.   Lubertha Rush, MD 09/16/23 [phone]

## 2023-09-16 NOTE — Discharge Instructions (Addendum)
 Given your slight elevation of your kidney function you should drink plenty of fluid including Gatorade without sugar, Pedialyte. Follow up with oncology for your CT and low hemoglobin.   We have placed a referral for oncology for you to follow-up with them and you will need biopsy to further evaluate this.  Return to the ER for worsening symptoms or any other concerns  Narrative & Impression  CLINICAL DATA:  Abdominal pain, acute, nonlocalized. * Tracking Code: BO *   EXAM: CT ABDOMEN AND PELVIS WITH CONTRAST   TECHNIQUE: Multidetector CT imaging of the abdomen and pelvis was performed using the standard protocol following bolus administration of intravenous contrast.   RADIATION DOSE REDUCTION: This exam was performed according to the departmental dose-optimization program which includes automated exposure control, adjustment of the mA and/or kV according to patient size and/or use of iterative reconstruction technique.   CONTRAST:  OMNIPAQUE  IOHEXOL  300 MG/ML  SOLN   COMPARISON:  None Available.   FINDINGS: Lower chest: There are at least 3, sub 4 mm, solid noncalcified nodules in the visualized bilateral lungs with largest measuring up to 3.4 x 4.0 mm in the right lung lower lobe (series 4, image 1), partially seen. Please see follow-up recommendations below. There are patchy atelectatic changes in the visualized lung bases. No overt consolidation. No pleural effusion. The heart is normal in size. No pericardial effusion.   Hepatobiliary: The liver is normal in size. Non-cirrhotic configuration. There are at least 3, hypoattenuating masses in the right hepatic lobe with largest in the segment 6, subcapsular location measuring 3.5 x 4.5 cm, compatible with metastases. There also ill-defined is similar characteristic hypoattenuating areas surrounding the liver along the region of caudate lobe, highly concerning for serosal implants. There is an additional 2.1 x 2.2  cm hypoattenuating lesion in the left hepatic lobe, segments 2/4 a, which may represent a cyst. No intrahepatic or extrahepatic bile duct dilation. Small volume dependent calcified gallstones noted without imaging signs of acute cholecystitis. There is focal wall thickening near the fundus of the gallbladder, most likely secondary to underlying adenomyomatosis. Normal gallbladder wall thickness. No pericholecystic inflammatory changes.   Pancreas: Small/atrophic pancreas. No discrete suspicious mass noted. Main pancreatic duct is not dilated. No peripancreatic fat stranding.   Spleen: Normal size spleen. There are multiple hypoattenuating masses with largest along the lateral subcapsular area measuring 2.0 x 3.2 cm, highly concerning for metastases.   Adrenals/Urinary Tract: Adrenal glands are unremarkable. Bilateral kidneys are small/atrophic and exhibits persistent fetal lobulations. No hydroureteronephrosis or nephroureterolithiasis. Unremarkable urinary bladder.   Stomach/Bowel: No disproportionate dilation of the small or large bowel loops. No evidence of abnormal bowel wall thickening or inflammatory changes. The appendix was not distinctly separately visualized; however there is no acute inflammatory process in the right lower quadrant.   Vascular/Lymphatic: There is small to moderate ascites. There are extensive centrally necrotic omental implants as well as multiple irregular hyperattenuating nodules along the peritoneal reflections, compatible with extensive peritoneal carcinomatosis. There multiple mildly enlarged retroperitoneal lymph nodes (for example, series 2, images 36, 41, 48, 55, etc.), with largest lymph node measuring up to 1.1 x 1.8 cm, also highly concerning for metastases. 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   Reproductive: Limited evaluation on CT scan exam. Normal-size anteverted uterus noted. 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 There is mixed solid/cystic appearance of bilateral ovaries, which may be due to drop metastasis along the surface. However,  correlate clinically and with tumor markers to determine the need for additional imaging with contrast-enhanced MRI pelvis.   Other: There are tumor deposit along the undersurface of the bilateral hemidiaphragm, right more than left. There are small fat containing umbilical and right inguinal hernias. The soft tissues and abdominal wall are otherwise unremarkable.   Musculoskeletal: No suspicious osseous lesions. There are mild multilevel degenerative changes in the visualized spine.   IMPRESSION: 1. There are findings compatible with extensive omental and peritoneal carcinomatosis with small to moderate ascites. There are multiple hypoattenuating masses in the liver and spleen, compatible with metastases. There are also multiple mildly enlarged retroperitoneal lymph nodes, also highly concerning for metastases. 2. There is mixed solid/cystic appearance of bilateral ovaries, which may be due to drop metastasis along the surface. However, correlate clinically and with tumor markers to determine the need for additional imaging with contrast-enhanced MRI pelvis. 3. There are at least 3, sub 4 mm, solid noncalcified nodules in the visualized bilateral lungs with largest measuring up to 3.4 x 4.0 mm in the right lung lower lobe, partially seen. In the given clinical context, these are also highly concerning for metastases. Consider further evaluation with dedicated chest CT and establish a baseline. 4. Multiple other nonacute observations, as described above.

## 2023-09-16 NOTE — Progress Notes (Signed)
 Beth Asper, DO sent to Beth Hart; P Ir Procedure Requests OK for CT guided biopsy of peritoneal mass.    CT shows multiple targets.  Beth Hart

## 2023-09-16 NOTE — ED Triage Notes (Signed)
 Pt to ED via POV. Pt reports that for the past 5 days she has had lower left abdominal pain. Pt denies increased urination or burning with urination. Pt states that this morning she had temp of 100. Pt reports that she has had some bowel issues over the last few months and her iron had been low. Pt reports increased constipation over the last few months. Pt denies N/V.

## 2023-09-16 NOTE — Telephone Encounter (Signed)
 New patient appointments have been arranged. Arranging for biopsy of peritoneal carcinomatosis. She will be contacted once scheduled with instructions.

## 2023-09-18 ENCOUNTER — Encounter: Payer: Self-pay | Admitting: Oncology

## 2023-09-18 ENCOUNTER — Inpatient Hospital Stay

## 2023-09-18 ENCOUNTER — Inpatient Hospital Stay: Attending: Oncology | Admitting: Oncology

## 2023-09-18 VITALS — BP 130/68 | HR 88 | Temp 100.4°F | Resp 18 | Ht 62.0 in | Wt 163.9 lb

## 2023-09-18 DIAGNOSIS — F32A Depression, unspecified: Secondary | ICD-10-CM | POA: Insufficient documentation

## 2023-09-18 DIAGNOSIS — Z79899 Other long term (current) drug therapy: Secondary | ICD-10-CM | POA: Insufficient documentation

## 2023-09-18 DIAGNOSIS — R932 Abnormal findings on diagnostic imaging of liver and biliary tract: Secondary | ICD-10-CM | POA: Insufficient documentation

## 2023-09-18 DIAGNOSIS — K769 Liver disease, unspecified: Secondary | ICD-10-CM

## 2023-09-18 DIAGNOSIS — C801 Malignant (primary) neoplasm, unspecified: Secondary | ICD-10-CM | POA: Insufficient documentation

## 2023-09-18 DIAGNOSIS — C786 Secondary malignant neoplasm of retroperitoneum and peritoneum: Secondary | ICD-10-CM

## 2023-09-18 DIAGNOSIS — R918 Other nonspecific abnormal finding of lung field: Secondary | ICD-10-CM | POA: Diagnosis not present

## 2023-09-18 DIAGNOSIS — Z8616 Personal history of COVID-19: Secondary | ICD-10-CM | POA: Insufficient documentation

## 2023-09-18 DIAGNOSIS — D509 Iron deficiency anemia, unspecified: Secondary | ICD-10-CM | POA: Insufficient documentation

## 2023-09-18 DIAGNOSIS — N183 Chronic kidney disease, stage 3 unspecified: Secondary | ICD-10-CM | POA: Diagnosis not present

## 2023-09-18 DIAGNOSIS — D649 Anemia, unspecified: Secondary | ICD-10-CM

## 2023-09-18 DIAGNOSIS — Z6828 Body mass index (BMI) 28.0-28.9, adult: Secondary | ICD-10-CM | POA: Diagnosis not present

## 2023-09-18 DIAGNOSIS — R188 Other ascites: Secondary | ICD-10-CM | POA: Diagnosis not present

## 2023-09-18 DIAGNOSIS — C7889 Secondary malignant neoplasm of other digestive organs: Secondary | ICD-10-CM | POA: Insufficient documentation

## 2023-09-18 DIAGNOSIS — E669 Obesity, unspecified: Secondary | ICD-10-CM | POA: Insufficient documentation

## 2023-09-18 DIAGNOSIS — Z801 Family history of malignant neoplasm of trachea, bronchus and lung: Secondary | ICD-10-CM | POA: Insufficient documentation

## 2023-09-18 DIAGNOSIS — E785 Hyperlipidemia, unspecified: Secondary | ICD-10-CM | POA: Diagnosis not present

## 2023-09-18 DIAGNOSIS — R978 Other abnormal tumor markers: Secondary | ICD-10-CM | POA: Insufficient documentation

## 2023-09-18 DIAGNOSIS — R590 Localized enlarged lymph nodes: Secondary | ICD-10-CM | POA: Insufficient documentation

## 2023-09-18 DIAGNOSIS — C787 Secondary malignant neoplasm of liver and intrahepatic bile duct: Secondary | ICD-10-CM | POA: Insufficient documentation

## 2023-09-18 DIAGNOSIS — R971 Elevated cancer antigen 125 [CA 125]: Secondary | ICD-10-CM | POA: Insufficient documentation

## 2023-09-18 LAB — LACTATE DEHYDROGENASE: LDH: 363 U/L — ABNORMAL HIGH (ref 98–192)

## 2023-09-18 LAB — CBC WITH DIFFERENTIAL/PLATELET
Abs Immature Granulocytes: 0.04 10*3/uL (ref 0.00–0.07)
Basophils Absolute: 0.1 10*3/uL (ref 0.0–0.1)
Basophils Relative: 1 %
Eosinophils Absolute: 0.1 10*3/uL (ref 0.0–0.5)
Eosinophils Relative: 1 %
HCT: 28.5 % — ABNORMAL LOW (ref 36.0–46.0)
Hemoglobin: 9.1 g/dL — ABNORMAL LOW (ref 12.0–15.0)
Immature Granulocytes: 0 %
Lymphocytes Relative: 15 %
Lymphs Abs: 1.4 10*3/uL (ref 0.7–4.0)
MCH: 27.7 pg (ref 26.0–34.0)
MCHC: 31.9 g/dL (ref 30.0–36.0)
MCV: 86.9 fL (ref 80.0–100.0)
Monocytes Absolute: 0.9 10*3/uL (ref 0.1–1.0)
Monocytes Relative: 10 %
Neutro Abs: 6.9 10*3/uL (ref 1.7–7.7)
Neutrophils Relative %: 73 %
Platelets: 399 10*3/uL (ref 150–400)
RBC: 3.28 MIL/uL — ABNORMAL LOW (ref 3.87–5.11)
RDW: 16.1 % — ABNORMAL HIGH (ref 11.5–15.5)
WBC: 9.4 10*3/uL (ref 4.0–10.5)
nRBC: 0 % (ref 0.0–0.2)

## 2023-09-18 LAB — FOLATE: Folate: >40 ng/mL (ref >5.9)

## 2023-09-18 LAB — IRON AND TIBC
Iron: 11 ug/dL — ABNORMAL LOW (ref 28–170)
Saturation Ratios: 4 % — ABNORMAL LOW (ref 10.4–31.8)
TIBC: 314 ug/dL (ref 250–450)
UIBC: 303 ug/dL

## 2023-09-18 LAB — RETIC PANEL
Immature Retic Fract: 11.5 % (ref 2.3–15.9)
RBC.: 3.27 MIL/uL — ABNORMAL LOW (ref 3.87–5.11)
Retic Count, Absolute: 35.3 10*3/uL (ref 19.0–186.0)
Retic Ct Pct: 1.1 % (ref 0.4–3.1)
Reticulocyte Hemoglobin: 23.3 pg — ABNORMAL LOW (ref >27.9)

## 2023-09-18 LAB — VITAMIN B12: Vitamin B-12: 394 pg/mL (ref 180–914)

## 2023-09-18 LAB — FERRITIN: Ferritin: 197 ng/mL (ref 11–307)

## 2023-09-18 NOTE — Assessment & Plan Note (Signed)
 Imaging findings were reviewed and discussed with patient.  Concerning for peritoneal carcinomatosis, liver metastasis.  Differential diagnosis includes gynecology malignancy, primary peritoneal carcinoma, GI malignancy etc. Check tumor markers including CA125, CEA, CA 19-9, LDH. Recommend to consult IR for biopsy of liver lesion or peritoneal carcinomatosis to establish tissue diagnosis. Recommend chest imaging.

## 2023-09-18 NOTE — Assessment & Plan Note (Signed)
 Pending above work up

## 2023-09-18 NOTE — Patient Instructions (Signed)
 Abdominal biopsy scheduled for Sep 26, 2023  Arrive at 1000  for 1100 appointment Come into the Heart and Vascular entrance at [facility]. This entrance is located in the front of the hospital. Do not eat or drink anything after midnight Take your regularly scheduled blood pressure, pain, or seizure medication You will need a driver for this procedure

## 2023-09-18 NOTE — Assessment & Plan Note (Addendum)
 Possible anemia due to chronic kidney disease. Check CBC, iron TIBC ferritin, B12 and folate.  Lab Results  Component Value Date   HGB 9.1 (L) 09/18/2023   TIBC 314 09/18/2023   IRONPCTSAT 4 (L) 09/18/2023   FERRITIN 197 09/18/2023

## 2023-09-18 NOTE — Progress Notes (Signed)
 Hematology/Oncology Consult note [phone] [phone]        REFERRING PROVIDER: Lubertha Rush, MD   CHIEF COMPLAINTS/REASON FOR VISIT:  Evaluation of peritoneal carcinomatosis, liver lesion.   ASSESSMENT & PLAN:   Peritoneal carcinomatosis Beth Hart) Imaging findings were reviewed and discussed with patient.  Concerning for peritoneal carcinomatosis, liver metastasis.  Differential diagnosis includes gynecology malignancy, primary peritoneal carcinoma, GI malignancy etc. Check tumor markers including CA125, CEA, CA 19-9, LDH. Recommend to consult IR for biopsy of liver lesion or peritoneal carcinomatosis to establish tissue diagnosis. Recommend chest imaging.  Liver lesion Pending above workup  Normocytic anemia Possible anemia due to chronic kidney disease. Check CBC, iron TIBC ferritin, B12 and folate.  Lab Results  Component Value Date   HGB 9.1 (L) 09/18/2023   TIBC 314 09/18/2023   IRONPCTSAT 4 (L) 09/18/2023   FERRITIN 197 09/18/2023      Orders Placed This Encounter  Procedures   CA 125    Standing Status:   Future    Number of Occurrences:   1    Expected Date:   09/18/2023    Expiration Date:   09/17/2024   CEA    Standing Status:   Future    Number of Occurrences:   1    Expected Date:   09/18/2023    Expiration Date:   09/17/2024   Cancer antigen 27.29    Standing Status:   Future    Number of Occurrences:   1    Expected Date:   09/18/2023    Expiration Date:   09/17/2024   Lactate dehydrogenase    Standing Status:   Future    Number of Occurrences:   1    Expected Date:   09/18/2023    Expiration Date:   09/17/2024   Ferritin    Standing Status:   Future    Number of Occurrences:   1    Expected Date:   09/18/2023    Expiration Date:   03/20/2024   Iron and TIBC    Standing Status:   Future    Number of Occurrences:   1    Expected Date:   09/18/2023    Expiration Date:   09/17/2024   Retic Panel    Standing Status:   Future     Number of Occurrences:   1    Expected Date:   09/18/2023    Expiration Date:   09/17/2024   CBC with Differential/Platelet    Standing Status:   Future    Number of Occurrences:   1    Expected Date:   09/18/2023    Expiration Date:   09/17/2024   Vitamin B12    Standing Status:   Future    Number of Occurrences:   1    Expected Date:   09/18/2023    Expiration Date:   09/17/2024   Folate    Standing Status:   Future    Number of Occurrences:   1    Expected Date:   09/18/2023    Expiration Date:   09/17/2024   Follow-up to be determined. All questions were answered. The patient knows to call the clinic with any problems, questions or concerns.  Beth Forbes, MD, PhD [facility] Hematology Oncology 09/18/2023   HISTORY OF PRESENTING ILLNESS:   Beth Hart is a  73 y.o.  female with PMH listed below was seen in consultation at the request of  Lubertha Rush,  MD  for evaluation of peritoneal carcinomatosis, liver lesion.  Discussed the use of AI scribe software for clinical note transcription with the patient, who gave verbal consent to proceed.   She experiences pressure and sharp pain in her abdomen, initially attributing it to a pulled muscle from yard work. The pain persisted and was accompanied by a fever, prompting her to visit the ER on 09/16/2023. The fever was around 100F at home and remained at that level upon arrival at the ER. 09/16/2023, CT abdomen pelvis with contrast showed extensive omental and peritoneal carcinomatosis with small to moderate ascites. There are multiple hypoattenuating masses in the liver and spleen, compatible with metastases. There are also multiple mildly enlarged retroperitoneal lymph nodes, also highly concerning for metastases. 2. There is mixed solid/cystic appearance of bilateral ovaries, which may be due to drop metastasis along the surface. However, correlate clinically and with tumor markers to determine the need for additional imaging  with contrast-enhanced MRI pelvis. 3. There are at least 3, sub 4 mm, solid noncalcified nodules in the visualized bilateral lungs with largest measuring up to 3.4 x 4.0 mm in the right lung lower lobe, partially seen. In the given clinical context, these are also highly concerning for metastases. Consider further evaluation with dedicated chest CT and establish a baseline. 4. Multiple other nonacute observations, as described above.    She has a history of constipation, which she associates with taking iron supplements after being rejected from donating blood due to low iron levels. She stopped the supplements, and the constipation resolved. No blood in her stool.  She has a persistent cough, especially when lying down. No nasal congestion, urinary symptoms, unintentional weight loss, or excessive sweating beyond hot flashes related to menopause.  Her family history includes father who had lung cancer attributed to smoking, but no history of colon, ovarian, or breast cancer.  She underwent a colonoscopy a few years ago, which was reported to be negative.  And a mammogram six months ago, which was negative she does not recall any recent stomach pain, dysphagia, or acid reflux.   MEDICAL HISTORY:  Past Medical History:  Diagnosis Date   Anemia    Arthritis    Asthma    as a child   Chicken pox    Colon polyps 2012   Depression    Hemorrhoids     SURGICAL HISTORY: Past Surgical History:  Procedure Laterality Date   BREAST CYST ASPIRATION Left 90s   BREAST SURGERY  2007   aspiration   CARPAL TUNNEL RELEASE  2010   REFRACTIVE SURGERY     TONSILLECTOMY      SOCIAL HISTORY: Social History   Socioeconomic History   Marital status: Married    Spouse name: Not on file   Number of children: Not on file   Years of education: Not on file   Highest education level: Not on file  Occupational History   Not on file  Tobacco Use   Smoking status: Never   Smokeless tobacco: Never   Vaping Use   Vaping status: Never Used  Substance and Sexual Activity   Alcohol use: No   Drug use: No   Sexual activity: Yes  Other Topics Concern   Not on file  Social History Narrative   married   Social Drivers of Corporate investment banker Strain: Low Risk  (06/03/2023)   Overall Financial Resource Strain (CARDIA)    Difficulty of Paying Living Expenses: Not hard at all  Food Insecurity: No Food Insecurity (09/18/2023)   Hunger Vital Sign    Worried About Running Out of Food in the Last Year: Never true    Ran Out of Food in the Last Year: Never true  Transportation Needs: No Transportation Needs (09/18/2023)   PRAPARE - Administrator, Civil Service (Medical): No    Lack of Transportation (Non-Medical): No  Physical Activity: Insufficiently Active (06/03/2023)   Exercise Vital Sign    Days of Exercise per Week: 3 days    Minutes of Exercise per Session: 10 min  Stress: No Stress Concern Present (06/03/2023)   [employer] of Occupational Health - Occupational Stress Questionnaire    Feeling of Stress : Not at all  Social Connections: Socially Integrated (06/03/2023)   Social Connection and Isolation Panel [NHANES]    Frequency of Communication with Friends and Family: More than three times a week    Frequency of Social Gatherings with Friends and Family: Three times a week    Attends Religious Services: More than 4 times per year    Active Member of Clubs or Organizations: Yes    Attends Banker Meetings: More than 4 times per year    Marital Status: Married  Catering manager Violence: Not At Risk (09/18/2023)   Humiliation, Afraid, Rape, and Kick questionnaire    Fear of Current or Ex-Partner: No    Emotionally Abused: No    Physically Abused: No    Sexually Abused: No    FAMILY HISTORY: Family History  Problem Relation Age of Onset   Alzheimer's disease Mother    Lung cancer Father    Breast cancer Neg Hx     ALLERGIES:  has no known  allergies.  MEDICATIONS:  Current Outpatient Medications  Medication Sig Dispense Refill   Calcium Carb-Cholecalciferol (CALCIUM 1000 + D PO) Take by mouth.     Cholecalciferol (VITAMIN D-3) 125 MCG (5000 UT) TABS Take by mouth.     citalopram  (CELEXA ) 20 MG tablet Take 1 tablet (20 mg total) by mouth daily. 45 tablet 0   Multiple Vitamin (MULTIVITAMIN) tablet Take 1 tablet by mouth daily.     No current facility-administered medications for this visit.    Review of Systems  Constitutional:  Negative for appetite change, chills, fatigue and fever.  HENT:   Negative for hearing loss and voice change.   Eyes:  Negative for eye problems.  Respiratory:  Negative for chest tightness and cough.   Cardiovascular:  Negative for chest pain.  Gastrointestinal:  Positive for abdominal pain. Negative for abdominal distention and blood in stool.  Endocrine: Negative for hot flashes.  Genitourinary:  Negative for difficulty urinating and frequency.   Musculoskeletal:  Negative for arthralgias.  Skin:  Negative for itching and rash.  Neurological:  Negative for extremity weakness.  Hematological:  Negative for adenopathy.  Psychiatric/Behavioral:  Negative for confusion.    PHYSICAL EXAMINATION: ECOG PERFORMANCE STATUS: 0 - Asymptomatic Vitals:   09/18/23 1530  BP: 130/68  Pulse: 88  Resp: 18  Temp: (!) 100.4 F (38 C)   Filed Weights   09/18/23 1530  Weight: 163 lb 14.4 oz (74.3 kg)    Physical Exam Constitutional:      General: She is not in acute distress. HENT:     Head: Normocephalic and atraumatic.  Eyes:     General: No scleral icterus. Cardiovascular:     Rate and Rhythm: Normal rate and regular rhythm.     Heart  sounds: Normal heart sounds.  Pulmonary:     Effort: Pulmonary effort is normal. No respiratory distress.     Breath sounds: No wheezing.  Abdominal:     General: Bowel sounds are normal. There is no distension.     Palpations: Abdomen is soft.   Musculoskeletal:        General: No deformity. Normal range of motion.     Cervical back: Normal range of motion and neck supple.  Skin:    General: Skin is warm and dry.     Findings: No erythema or rash.  Neurological:     Mental Status: She is alert and oriented to person, place, and time. Mental status is at baseline.  Psychiatric:        Mood and Affect: Mood normal.     LABORATORY DATA:  I have reviewed the data as listed    Latest Ref Rng & Units 09/18/2023    4:09 PM 09/16/2023   10:09 AM 08/03/2020    9:56 AM  CBC  WBC 4.0 - 10.5 K/uL 9.4  9.8  6.5   Hemoglobin 12.0 - 15.0 g/dL 9.1  9.3  16.1   Hematocrit 36.0 - 46.0 % 28.5  29.1  37.0   Platelets 150 - 400 K/uL 399  344  196.0       Latest Ref Rng & Units 09/16/2023   10:09 AM 08/17/2022    8:28 AM 08/15/2021    8:25 AM  CMP  Glucose 70 - 99 mg/dL 096     BUN 8 - 23 mg/dL 27     Creatinine 0.45 - 1.00 mg/dL 4.09     Sodium 811 - 914 mmol/L 137     Potassium 3.5 - 5.1 mmol/L 4.3     Chloride 98 - 111 mmol/L 104     CO2 22 - 32 mmol/L 22     Calcium 8.9 - 10.3 mg/dL 9.2     Total Protein 6.5 - 8.1 g/dL 6.7  6.4  6.6   Total Bilirubin 0.0 - 1.2 mg/dL 0.6  0.4  0.4   Alkaline Phos 38 - 126 U/L 66  74  70   AST 15 - 41 U/L 38  19  18   ALT 0 - 44 U/L 19  12  13        RADIOGRAPHIC STUDIES: I have personally reviewed the radiological images as listed and agreed with the findings in the report. CT ABDOMEN PELVIS W CONTRAST Result Date: 09/16/2023 CLINICAL DATA:  Abdominal pain, acute, nonlocalized. * Tracking Code: BO * EXAM: CT ABDOMEN AND PELVIS WITH CONTRAST TECHNIQUE: Multidetector CT imaging of the abdomen and pelvis was performed using the standard protocol following bolus administration of intravenous contrast. RADIATION DOSE REDUCTION: This exam was performed according to the departmental dose-optimization program which includes automated exposure control, adjustment of the mA and/or kV according to patient  size and/or use of iterative reconstruction technique. CONTRAST:  OMNIPAQUE  IOHEXOL  300 MG/ML  SOLN COMPARISON:  None Available. FINDINGS: Lower chest: There are at least 3, sub 4 mm, solid noncalcified nodules in the visualized bilateral lungs with largest measuring up to 3.4 x 4.0 mm in the right lung lower lobe (series 4, image 1), partially seen. Please see follow-up recommendations below. There are patchy atelectatic changes in the visualized lung bases. No overt consolidation. No pleural effusion. The heart is normal in size. No pericardial effusion. Hepatobiliary: The liver is normal in size. Non-cirrhotic configuration. There are at least  3, hypoattenuating masses in the right hepatic lobe with largest in the segment 6, subcapsular location measuring 3.5 x 4.5 cm, compatible with metastases. There also ill-defined is similar characteristic hypoattenuating areas surrounding the liver along the region of caudate lobe, highly concerning for serosal implants. There is an additional 2.1 x 2.2 cm hypoattenuating lesion in the left hepatic lobe, segments 2/4 a, which may represent a cyst. No intrahepatic or extrahepatic bile duct dilation. Small volume dependent calcified gallstones noted without imaging signs of acute cholecystitis. There is focal wall thickening near the fundus of the gallbladder, most likely secondary to underlying adenomyomatosis. Normal gallbladder wall thickness. No pericholecystic inflammatory changes. Pancreas: Small/atrophic pancreas. No discrete suspicious mass noted. Main pancreatic duct is not dilated. No peripancreatic fat stranding. Spleen: Normal size spleen. There are multiple hypoattenuating masses with largest along the lateral subcapsular area measuring 2.0 x 3.2 cm, highly concerning for metastases. Adrenals/Urinary Tract: Adrenal glands are unremarkable. Bilateral kidneys are small/atrophic and exhibits persistent fetal lobulations. No hydroureteronephrosis or  nephroureterolithiasis. Unremarkable urinary bladder. Stomach/Bowel: No disproportionate dilation of the small or large bowel loops. No evidence of abnormal bowel wall thickening or inflammatory changes. The appendix was not distinctly separately visualized; however there is no acute inflammatory process in the right lower quadrant. Vascular/Lymphatic: There is small to moderate ascites. There are extensive centrally necrotic omental implants as well as multiple irregular hyperattenuating nodules along the peritoneal reflections, compatible with extensive peritoneal carcinomatosis. There multiple mildly enlarged retroperitoneal lymph nodes (for example, series 2, images 36, 41, 48, 55, etc.), with largest lymph node measuring up to 1.1 x 1.8 cm, also highly concerning for metastases. 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 Reproductive: Limited evaluation on CT scan exam. Normal-size anteverted uterus noted. 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 There is mixed solid/cystic appearance of bilateral ovaries, which may be due to drop metastasis along the surface. However, correlate clinically and with tumor markers to determine the need for additional imaging with contrast-enhanced MRI pelvis. Other: There are tumor deposit along the undersurface of the bilateral hemidiaphragm, right more than left. There are small fat containing umbilical and right inguinal hernias. The soft tissues and abdominal wall are otherwise unremarkable. Musculoskeletal: No suspicious osseous lesions. There are mild multilevel degenerative changes in the visualized spine. IMPRESSION: 1. There are findings compatible with extensive omental and peritoneal carcinomatosis with small to moderate ascites. There are multiple hypoattenuating masses in the liver and spleen, compatible with metastases. There are also multiple mildly enlarged retroperitoneal lymph  nodes, also highly concerning for metastases. 2. There is mixed solid/cystic appearance of bilateral ovaries, which may be due to drop metastasis along the surface. However, correlate clinically and with tumor markers to determine the need for additional imaging with contrast-enhanced MRI pelvis. 3. There are at least 3, sub 4 mm, solid noncalcified nodules in the visualized bilateral lungs with largest measuring up to 3.4 x 4.0 mm in the right lung lower lobe, partially seen. In the given clinical context, these are also highly concerning for metastases. Consider further evaluation with dedicated chest CT and establish a baseline. 4. Multiple other nonacute observations, as described above. Electronically Signed   By: Beula Brunswick M.D.   On: 09/16/2023 12:10

## 2023-09-19 LAB — CEA: CEA: 1.2 ng/mL (ref 0.0–4.7)

## 2023-09-19 LAB — CANCER ANTIGEN 27.29: CA 27.29: 415.6 U/mL — ABNORMAL HIGH (ref 0.0–38.6)

## 2023-09-19 LAB — CA 125: Cancer Antigen (CA) 125: 609 U/mL — ABNORMAL HIGH (ref 0.0–38.1)

## 2023-09-24 ENCOUNTER — Other Ambulatory Visit: Payer: Self-pay | Admitting: Radiology

## 2023-09-24 ENCOUNTER — Telehealth: Payer: Self-pay

## 2023-09-24 ENCOUNTER — Other Ambulatory Visit: Payer: Self-pay

## 2023-09-24 ENCOUNTER — Ambulatory Visit
Admission: RE | Admit: 2023-09-24 | Discharge: 2023-09-24 | Disposition: A | Discharge status: Home / Self Care | Source: Ambulatory Visit | Attending: Oncology | Admitting: Oncology

## 2023-09-24 DIAGNOSIS — C786 Secondary malignant neoplasm of retroperitoneum and peritoneum: Secondary | ICD-10-CM | POA: Diagnosis present

## 2023-09-24 DIAGNOSIS — R188 Other ascites: Secondary | ICD-10-CM | POA: Diagnosis not present

## 2023-09-24 DIAGNOSIS — R180 Malignant ascites: Secondary | ICD-10-CM | POA: Diagnosis present

## 2023-09-24 DIAGNOSIS — C801 Malignant (primary) neoplasm, unspecified: Secondary | ICD-10-CM | POA: Insufficient documentation

## 2023-09-24 MED ORDER — LIDOCAINE HCL (PF) 1 % IJ SOLN
10.0000 mL | Freq: Once | INTRAMUSCULAR | Status: AC
  Administered 2023-09-24: 10 mL via INTRADERMAL
  Filled 2023-09-24: qty 10

## 2023-09-24 NOTE — Discharge Instructions (Signed)
 Reviewed with patient

## 2023-09-24 NOTE — Telephone Encounter (Addendum)
 Received call from Ms. Carlino. She is having increasing abdominal discomfort that she feels is from ascites increasing. This is causing increasing shortness of breath with activity. Spoke with Dr. Wilhelmenia Harada and we will get US  paracentesis. US  can accommodate this morning. Provided education on paracentesis and directions to medical mall.

## 2023-09-24 NOTE — Procedures (Signed)
 Ultrasound-guided diagnostic and therapeutic paracentesis performed yielding 2.5 liters of straw colored fluid.  Fluid was sent to lab for analysis. No immediate complications. EBL is none.

## 2023-09-25 ENCOUNTER — Inpatient Hospital Stay

## 2023-09-25 ENCOUNTER — Inpatient Hospital Stay: Admitting: Obstetrics and Gynecology

## 2023-09-25 ENCOUNTER — Telehealth: Payer: Self-pay

## 2023-09-25 ENCOUNTER — Other Ambulatory Visit: Payer: Self-pay

## 2023-09-25 ENCOUNTER — Encounter: Payer: Self-pay | Admitting: Obstetrics and Gynecology

## 2023-09-25 VITALS — BP 120/67 | HR 86 | Wt 157.8 lb

## 2023-09-25 DIAGNOSIS — R918 Other nonspecific abnormal finding of lung field: Secondary | ICD-10-CM

## 2023-09-25 DIAGNOSIS — C786 Secondary malignant neoplasm of retroperitoneum and peritoneum: Secondary | ICD-10-CM | POA: Diagnosis not present

## 2023-09-25 DIAGNOSIS — C787 Secondary malignant neoplasm of liver and intrahepatic bile duct: Secondary | ICD-10-CM | POA: Diagnosis not present

## 2023-09-25 DIAGNOSIS — C7889 Secondary malignant neoplasm of other digestive organs: Secondary | ICD-10-CM

## 2023-09-25 DIAGNOSIS — C801 Malignant (primary) neoplasm, unspecified: Secondary | ICD-10-CM

## 2023-09-25 DIAGNOSIS — R971 Elevated cancer antigen 125 [CA 125]: Secondary | ICD-10-CM

## 2023-09-25 DIAGNOSIS — R978 Other abnormal tumor markers: Secondary | ICD-10-CM

## 2023-09-25 NOTE — Progress Notes (Addendum)
 Gynecologic Oncology Consult Visit   Referring Provider: Dr. Timmy Forbes  Chief Concern: Evaluation of peritoneal carcinomatosis  Subjective:  Beth Hart is a 73 y.o. G1P1 female who is seen in consultation from dr. Wilhelmenia Harada who saw her at the request of Dr. Peggi Bowels for peritoneal carcinomatosis and a liver lesion.    09/16/2023 She experienced persistent pressure and sharp pain in her abdomen and fever, prompting her to visit the ER.   CT abdomen pelvis with contrast showed extensive omental and peritoneal carcinomatosis with small to moderate ascites. There are multiple hypoattenuating masses in the liver and spleen, compatible with metastases. There are also multiple mildly enlarged retroperitoneal lymph nodes, also highly concerning for metastases. 2. There is mixed solid/cystic appearance of bilateral ovaries, which may be due to drop metastasis along the surface. However, correlate clinically and with tumor markers to determine the need for additional imaging with contrast-enhanced MRI pelvis. 3. There are at least 3, sub 4 mm, solid noncalcified nodules in the visualized bilateral lungs with largest measuring up to 3.4 x 4.0 mm in the right lung lower lobe, partially seen. In the given clinical context, these are also highly concerning for metastases. Consider further evaluation with dedicated chest CT and establish a baseline. 4. Multiple other nonacute observations, as described above.  09/18/2023 Tumor markers CA 27.29 415.6 High    CEA 1.2   Cancer Antigen (CA) 125 609.0 High      09/24/2023 Paracentesis - cytology pending  CT-guided biopsy pending tomorrow.     Her family history includes father who had lung cancer attributed to smoking, but no history of colon, ovarian, or breast cancer.   Colonoscopy a few years ago, which was reported to be negative.   Mammogram 02/14/2023, which was negative.  She has excellent family support and her husband is here today.     Genetic testing: pending result of biopsy Molecular testing: pending result of biopsy  Problem List: Patient Active Problem List   Diagnosis Date Noted   Liver lesion 09/18/2023   Peritoneal carcinomatosis (HCC) 09/18/2023   Normocytic anemia 09/18/2023   Positive self-administered antigen test for COVID-19 01/16/2023   Routine general medical examination at a health care facility 08/17/2022   Prediabetes 08/15/2021   Obesity (BMI 30.0-34.9) 07/25/2020   Proteinuria 06/25/2019   Depression 11/23/2015   CKD (chronic kidney disease) stage 3, GFR 30-59 ml/min (HCC) 11/23/2015   Hyperlipidemia 11/23/2015    Past Medical History: Past Medical History:  Diagnosis Date   Anemia    Arthritis    Asthma    as a child   Chicken pox    Colon polyps 2012   Depression    Hemorrhoids     Past Surgical History: Past Surgical History:  Procedure Laterality Date   BREAST CYST ASPIRATION Left 90s   BREAST SURGERY  2007   aspiration   CARPAL TUNNEL RELEASE  2010   REFRACTIVE SURGERY     TONSILLECTOMY      Past Gynecologic History:  Menarche: in 1964 Menstrual details: Postmenopausal Last Menstrual Period: unknown History of Abnormal pap: No history of abnormal Paps   OB History:  OB History  Gravida Para Term Preterm AB Living  1 1 1   1   SAB IAB Ectopic Multiple Live Births      1    # Outcome Date GA Lbr Len/2nd Weight Sex Type Anes PTL Lv  1 Term  Family History: Family History  Problem Relation Age of Onset   Alzheimer's disease Mother    Lung cancer Father    Breast cancer Neg Hx     Social History: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  She has 1 biological child and 2 children from the marriage.  She has 4 grandchildren. Social History   Socioeconomic History   Marital status: Married    Spouse name: Not on file   Number of children: Not on file   Years of education: Not on file   Highest  education level: Not on file  Occupational History   Not on file  Tobacco Use   Smoking status: Never   Smokeless tobacco: Never  Vaping Use   Vaping status: Never Used  Substance and Sexual Activity   Alcohol use: No   Drug use: No   Sexual activity: Yes  Other Topics Concern   Not on file  Social History Narrative   married   Social Drivers of Corporate investment banker Strain: Low Risk  (06/03/2023)   Overall Financial Resource Strain (CARDIA)    Difficulty of Paying Living Expenses: Not hard at all  Food Insecurity: No Food Insecurity (09/25/2023)   Hunger Vital Sign    Worried About Running Out of Food in the Last Year: Never true    Ran Out of Food in the Last Year: Never true  Transportation Needs: No Transportation Needs (09/25/2023)   PRAPARE - Administrator, Civil Service (Medical): No    Lack of Transportation (Non-Medical): No  Physical Activity: Insufficiently Active (06/03/2023)   Exercise Vital Sign    Days of Exercise per Week: 3 days    Minutes of Exercise per Session: 10 min  Stress: No Stress Concern Present (06/03/2023)   [employer] of Occupational Health - Occupational Stress Questionnaire    Feeling of Stress : Not at all  Social Connections: Socially Integrated (06/03/2023)   Social Connection and Isolation Panel [NHANES]    Frequency of Communication with Friends and Family: More than three times a week    Frequency of Social Gatherings with Friends and Family: Three times a week    Attends Religious Services: More than 4 times per year    Active Member of Clubs or Organizations: Yes    Attends Banker Meetings: More than 4 times per year    Marital Status: Married  Catering manager Violence: Not At Risk (09/25/2023)   Humiliation, Afraid, Rape, and Kick questionnaire    Fear of Current or Ex-Partner: No    Emotionally Abused: No    Physically Abused: No    Sexually Abused: No    Allergies: No Known  Allergies  Current Medications: Current Outpatient Medications  Medication Sig Dispense Refill   citalopram  (CELEXA ) 20 MG tablet Take 1 tablet (20 mg total) by mouth daily. 45 tablet 0   Calcium Carb-Cholecalciferol (CALCIUM 1000 + D PO) Take by mouth. (Patient not taking: Reported on 09/25/2023)     Cholecalciferol (VITAMIN D-3) 125 MCG (5000 UT) TABS Take by mouth. (Patient not taking: Reported on 09/25/2023)     Multiple Vitamin (MULTIVITAMIN) tablet Take 1 tablet by mouth daily. (Patient not taking: Reported on 09/25/2023)     No current facility-administered medications for this visit.    Review of Systems General: positive for fevers and nightsweats; negative for changes in weight Skin: negative for changes in moles or sores  or rash Eyes: negative for changes in vision HEENT: positive for tinnitus; negative for change in hearing, voice changes Pulmonary: positive for dyspnea and cough; negative orthopnea or wheezing Cardiac: negative for palpitations, pain Gastrointestinal: h/o IBS with high FODMAP foods; negative for nausea, vomiting, constipation, diarrhea, hematemesis, hematochezia Genitourinary/Sexual: negative for dysuria, retention, hematuria, incontinence Ob/Gyn:  no active bleeding  Musculoskeletal: negative for pain, joint pain, back pain Hematology: negative for easy bruising Neurologic/Psych: negative for headaches, seizures, paralysis, weakness, numbness  Objective:  Physical Examination:  BP 120/67   Pulse 86   Wt 157 lb 12.8 oz (71.6 kg)   BMI 28.86 kg/m   Performance status: 1   GENERAL: Patient is a well appearing female in no acute distress HEENT:  PERRL, neck supple with midline trachea.  NODES:  No cervical, supraclavicular, axillary, or inguinal lymphadenopathy palpated.  LUNGS: Normal respiratory effort ABDOMEN:  Soft but with moderate ascites, nontender.  She has a dressing in the left mid quadrant where the paracentesis was performed.  Possible  pelvic midline mass and able to determine size.  Nontender on palpation. EXTREMITIES:  No peripheral edema.   NEURO:  Nonfocal. Well oriented.  Appropriate affect.  Pelvic: EGBUS: no lesions Cervix: no lesions, nontender, mobile Vagina: no lesions, no discharge or bleeding Uterus: normal size, nontender, mobile Adnexa: palpable smooth mass in the cul-de-sac may represent ascites. Rectovaginal: confirmatory.  The mass is palpable, smooth and pushes against the rectum.  There may be some nodularity.  The mass does not seem to be fixed.  EMBx performed.   The risks and benefits of the procedure were reviewed and informed consent obtained. Time out was performed. The patient received pre-procedure teaching and expressed understanding. The post-procedure instructions were reviewed with the patient and she expressed understanding. The patient does not have any barriers to learning.  Speculum placed in the vaginal vault and cervix identified. Cervix cleansed with Betadine.  The Pipelle could not be advanced beyond the internal os. The anterior lip grasped with a tenaculum. The pipelle was inserted to 7 cm. The specimen was obtained without difficult but there was a very small amount. The tenaculum was removed and hemostasis was excellent. Post-procedure evaluation the patient was stable without complaints.     Lab Review Lab Results  Component Value Date   WBC 9.4 09/18/2023   HGB 9.1 (L) 09/18/2023   HCT 28.5 (L) 09/18/2023   MCV 86.9 09/18/2023   PLT 399 09/18/2023     Chemistry      Component Value Date/Time   NA 137 09/16/2023 1009   NA 140 12/18/2018 0000   K 4.3 09/16/2023 1009   CL 104 09/16/2023 1009   CO2 22 09/16/2023 1009   BUN 27 (H) 09/16/2023 1009   BUN 20 12/18/2018 0000   CREATININE 1.52 (H) 09/16/2023 1009   GLU 97 12/18/2018 0000      Component Value Date/Time   CALCIUM 9.2 09/16/2023 1009   ALKPHOS 66 09/16/2023 1009   AST 38 09/16/2023 1009   ALT 19  09/16/2023 1009   BILITOT 0.6 09/16/2023 1009        Radiologic Imaging: 09/16/2023 CLINICAL DATA:  Abdominal pain, acute, nonlocalized. * Tracking Code: BO *   EXAM: CT ABDOMEN AND PELVIS WITH CONTRAST   TECHNIQUE: Multidetector CT imaging of the abdomen and pelvis was performed using the standard protocol following bolus administration of intravenous contrast.   RADIATION DOSE REDUCTION: This exam was performed according to the departmental dose-optimization program which  includes automated exposure control, adjustment of the mA and/or kV according to patient size and/or use of iterative reconstruction technique.   CONTRAST:  OMNIPAQUE  IOHEXOL  300 MG/ML  SOLN   COMPARISON:  None Available.   FINDINGS: Lower chest: There are at least 3, sub 4 mm, solid noncalcified nodules in the visualized bilateral lungs with largest measuring up to 3.4 x 4.0 mm in the right lung lower lobe (series 4, image 1), partially seen. Please see follow-up recommendations below. There are patchy atelectatic changes in the visualized lung bases. No overt consolidation. No pleural effusion. The heart is normal in size. No pericardial effusion.   Hepatobiliary: The liver is normal in size. Non-cirrhotic configuration. There are at least 3, hypoattenuating masses in the right hepatic lobe with largest in the segment 6, subcapsular location measuring 3.5 x 4.5 cm, compatible with metastases. There also ill-defined is similar characteristic hypoattenuating areas surrounding the liver along the region of caudate lobe, highly concerning for serosal implants. There is an additional 2.1 x 2.2 cm hypoattenuating lesion in the left hepatic lobe, segments 2/4 a, which may represent a cyst. No intrahepatic or extrahepatic bile duct dilation. Small volume dependent calcified gallstones noted without imaging signs of acute cholecystitis. There is focal wall thickening near the fundus of the  gallbladder, most likely secondary to underlying adenomyomatosis. Normal gallbladder wall thickness. No pericholecystic inflammatory changes.   Pancreas: Small/atrophic pancreas. No discrete suspicious mass noted. Main pancreatic duct is not dilated. No peripancreatic fat stranding.   Spleen: Normal size spleen. There are multiple hypoattenuating masses with largest along the lateral subcapsular area measuring 2.0 x 3.2 cm, highly concerning for metastases.   Adrenals/Urinary Tract: Adrenal glands are unremarkable. Bilateral kidneys are small/atrophic and exhibits persistent fetal lobulations. No hydroureteronephrosis or nephroureterolithiasis. Unremarkable urinary bladder.   Stomach/Bowel: No disproportionate dilation of the small or large bowel loops. No evidence of abnormal bowel wall thickening or inflammatory changes. The appendix was not distinctly separately visualized; however there is no acute inflammatory process in the right lower quadrant.   Vascular/Lymphatic: There is small to moderate ascites. There are extensive centrally necrotic omental implants as well as multiple irregular hyperattenuating nodules along the peritoneal reflections, compatible with extensive peritoneal carcinomatosis. There multiple mildly enlarged retroperitoneal lymph nodes (for example, series 2, images 36, 41, 48, 55, etc.), with largest lymph node measuring up to 1.1 x 1.8 cm, also highly concerning for metastases. 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   Reproductive: Limited evaluation on CT scan exam. Normal-size anteverted uterus noted. 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 There is mixed solid/cystic appearance of bilateral ovaries, which may be due to drop metastasis along the surface. However, correlate clinically and with tumor markers to determine the need for additional imaging with  contrast-enhanced MRI pelvis.   Other: There are tumor deposit along the undersurface of the bilateral hemidiaphragm, right more than left. There are small fat containing umbilical and right inguinal hernias. The soft tissues and abdominal wall are otherwise unremarkable.   Musculoskeletal: No suspicious osseous lesions. There are mild multilevel degenerative changes in the visualized spine.   IMPRESSION: 1. There are findings compatible with extensive omental and peritoneal carcinomatosis with small to moderate ascites. There are multiple hypoattenuating masses in the liver and spleen, compatible with metastases. There are also multiple mildly enlarged retroperitoneal lymph nodes, also highly concerning for metastases. 2. There is mixed solid/cystic appearance of bilateral ovaries, which may be due  to drop metastasis along the surface. However, correlate clinically and with tumor markers to determine the need for additional imaging with contrast-enhanced MRI pelvis. 3. There are at least 3, sub 4 mm, solid noncalcified nodules in the visualized bilateral lungs with largest measuring up to 3.4 x 4.0 mm in the right lung lower lobe, partially seen. In the given clinical context, these are also highly concerning for metastases. Consider further evaluation with dedicated chest CT and establish a baseline. 4. Multiple other nonacute observations, as described above.     Electronically Signed   By: Beula Brunswick M.D.   On: 09/16/2023 12:10      Assessment:  Beth Hart is a 73 y.o. female diagnosed with peritoneal carcinomatosis, liver lesions, splenic lesion, enlarged lymph nodes and elevated CA27.29 and CA125, Cytology and biopsy pending.   Pulmonary nodule.   Anemia  Azotemia   Medical co-morbidities complicating care: Anemia, azotemia, asthma Plan:   Problem List Items Addressed This Visit       Other   Peritoneal carcinomatosis (HCC) - Primary (Chronic)    Relevant Orders   CT Chest W Contrast   Surgical pathology   Other Visit Diagnoses       Multiple lung nodules       Relevant Orders   CT Chest W Contrast       We discussed options for management which are pending until the cytology and biopsy results are finalized. We discussed endometrial biopsy which was performed today.  Precautions regarding the endometrial biopsy and signs and symptoms of infection were provided.  Given the scant amount of tissue reviewed removed I doubt she has an endometrial malignancy.  She could have an ovarian or tubal malignancy or primary peritoneal cancer.  We can discuss with Dr. Wilhelmenia Harada after results are finalized. Given the extent of disease if she has a gynecologic malignancy recommend neoadjuvant therapy and then follow up after three cycles to assess disease response and possibility of surgery.     The patient's diagnosis, an outline of the further diagnostic and laboratory studies which will be required, the recommendation, and alternatives were discussed.  All questions were answered to the patient's satisfaction.  A total of 60 minutes were spent with the patient/family today; >50% was spent in education, counseling and coordination of care for peritoneal carcinomatosis, liver lesions, splenic lesion, enlarged lymph nodes and elevated CA27.29 and CA125 - assess for gynecologic malignancy.  Winnell Bento Iola Manila, MD    CC:  Dr. Timmy Forbes

## 2023-09-25 NOTE — Progress Notes (Signed)
 Patient for CT guided biopsy of peritoneal mass on Thurs 09/26/23, I called and spoke with the patient on the phone and gave pre-procedure instructions. Pt was made aware to be here at 10a, NPO after MN prior to procedure as well as driver post procedure/recovery/discharge. Pt stated understanding.  Called 09/25/23

## 2023-09-25 NOTE — Telephone Encounter (Signed)
 Embx ordered under wrong collecting provider. Reordered under Dr. Randalyn Bushman. Unable to delete previous order. Only one sample sent to pathology.

## 2023-09-25 NOTE — H&P (Signed)
 Chief Complaint: Patient was seen in consultation today for peritoneal carcinomatosis   Procedure: Abdominal Mass Biopsy   Referring Physician(s): Yu,Zhou  Supervising Physician: Myrlene Asper  Patient Status: [facility] - Out-pt  History of Present Illness: Beth Hart is a 73 y.o. female with a history of CKD 3 who presented to the ED on 5/19 with complaints of LLQ pain and fever for the previous week. ED workup significant for CT A/P revealing findings compatible with extensive omental and peritoneal carcinomatosis; multiple hypoattenuating masses in the liver and spleen compatible with metastases; multiple mildly enlarged retroperitoneal lymph nodes highly concerning for metastases; and 3 solid noncalcified nodules in bilateral lungs highly concerning for metastases. Abnormal CT findings discussed with Dr. Wilhelmenia Harada at the time who recommended outpatient follow up for biopsy. Patient subsequently referred to IR for biopsy of peritoneal mass.  Currently patient is resting in bed with her husband at the bedside. States that since her paracentesis 2 days ago her abdomen has been feeling much better. Admits to a continued feeling of tightness, but denies any overt pain. She has also had an intermittent, low-grade fever for the past 10 days as well as diarrhea for the past few days that she attributes to nerves. VSS. Labs WNL. All questions and concerns answered at the bedside.    Code Status: Full Code  Past Medical History:  Diagnosis Date   Anemia    Arthritis    Asthma    as a child   Chicken pox    Colon polyps 2012   Depression    Hemorrhoids     Past Surgical History:  Procedure Laterality Date   BREAST CYST ASPIRATION Left 90s   BREAST SURGERY  2007   aspiration   CARPAL TUNNEL RELEASE  2010   REFRACTIVE SURGERY     TONSILLECTOMY      Allergies: Patient has no known allergies.  Medications: Prior to Admission medications   Medication Sig Start Date End Date  Taking? Authorizing Provider  Calcium Carb-Cholecalciferol (CALCIUM 1000 + D PO) Take by mouth. Patient not taking: Reported on 09/25/2023    [provider]  Cholecalciferol (VITAMIN D-3) 125 MCG (5000 UT) TABS Take by mouth. Patient not taking: Reported on 09/25/2023    [provider]  citalopram  (CELEXA ) 20 MG tablet Take 1 tablet (20 mg total) by mouth daily. 09/11/23   Tullo, Teresa L, MD  Multiple Vitamin (MULTIVITAMIN) tablet Take 1 tablet by mouth daily. Patient not taking: Reported on 09/25/2023    [provider]     Family History  Problem Relation Age of Onset   Alzheimer's disease Mother    Lung cancer Father    Breast cancer Neg Hx     Social History   Socioeconomic History   Marital status: Married    Spouse name: Not on file   Number of children: Not on file   Years of education: Not on file   Highest education level: Not on file  Occupational History   Not on file  Tobacco Use   Smoking status: Never   Smokeless tobacco: Never  Vaping Use   Vaping status: Never Used  Substance and Sexual Activity   Alcohol use: No   Drug use: No   Sexual activity: Yes  Other Topics Concern   Not on file  Social History Narrative   married   Social Drivers of Health   Financial Resource Strain: Low Risk  (06/03/2023)   Overall Physicist, medical Strain (  CARDIA)    Difficulty of Paying Living Expenses: Not hard at all  Food Insecurity: No Food Insecurity (09/25/2023)   Hunger Vital Sign    Worried About Running Out of Food in the Last Year: Never true    Ran Out of Food in the Last Year: Never true  Transportation Needs: No Transportation Needs (09/25/2023)   PRAPARE - Administrator, Civil Service (Medical): No    Lack of Transportation (Non-Medical): No  Physical Activity: Insufficiently Active (06/03/2023)   Exercise Vital Sign    Days of Exercise per Week: 3 days    Minutes of Exercise per Session: 10 min  Stress: No Stress  Concern Present (06/03/2023)   [employer] of Occupational Health - Occupational Stress Questionnaire    Feeling of Stress : Not at all  Social Connections: Socially Integrated (06/03/2023)   Social Connection and Isolation Panel [NHANES]    Frequency of Communication with Friends and Family: More than three times a week    Frequency of Social Gatherings with Friends and Family: Three times a week    Attends Religious Services: More than 4 times per year    Active Member of Clubs or Organizations: Yes    Attends Banker Meetings: More than 4 times per year    Marital Status: Married    Review of Systems  Constitutional:  Positive for fever (intermittent for the past 10 days).  Gastrointestinal:  Positive for diarrhea.       Abdominal tightness  Denies any N/V, chest pain, shortness of breath, abdominal pain, or constipation. All other ROS negative.  Vital Signs: BP 119/71   Pulse 73   Temp 98.9 F (37.2 C) (Oral)   Resp 18   Ht 5\' 2"  (1.575 m)   Wt 156 lb 4.8 oz (70.9 kg)   SpO2 95%   BMI 28.59 kg/m    Physical Exam Vitals reviewed.  Constitutional:      Appearance: Normal appearance.  HENT:     Head: Normocephalic and atraumatic.     Mouth/Throat:     Mouth: Mucous membranes are moist.     Pharynx: Oropharynx is clear.  Cardiovascular:     Rate and Rhythm: Normal rate and regular rhythm.     Heart sounds: Normal heart sounds.  Pulmonary:     Effort: Pulmonary effort is normal.     Breath sounds: Normal breath sounds.  Abdominal:     General: Abdomen is flat. There is no distension.     Palpations: Abdomen is soft.     Tenderness: There is no abdominal tenderness.  Musculoskeletal:        General: Normal range of motion.     Cervical back: Normal range of motion.  Skin:    General: Skin is warm and dry.  Neurological:     General: No focal deficit present.     Mental Status: She is alert and oriented to person, place, and time. Mental status is  at baseline.  Psychiatric:        Mood and Affect: Mood normal.        Behavior: Behavior normal.        Judgment: Judgment normal.     Imaging: US  Paracentesis Result Date: 09/24/2023 INDICATION: 73 year old female. History of peritoneal carcinomatosis with recurrent ascites. Request is for therapeutic and diagnostic paracentesis EXAM: ULTRASOUND GUIDED THERAPEUTIC AND DIAGNOSTIC LEFT-SIDED PARACENTESIS MEDICATIONS: lidocaine  1% 10 mL COMPLICATIONS: None immediate. PROCEDURE: Informed written consent was obtained from the  patient after a discussion of the risks, benefits and alternatives to treatment. A timeout was performed prior to the initiation of the procedure. Initial ultrasound scanning demonstrates a small amount of ascites within the left lower abdominal quadrant. The left lower abdomen was prepped and draped in the usual sterile fashion. 1% lidocaine was used for local anesthesia. Following this, a 19 gauge, 7-cm, Yueh catheter was introduced. An ultrasound image was saved for documentation purposes. The paracentesis was performed. The catheter was removed and a dressing was applied. The patient tolerated the procedure well without immediate post procedural complication. FINDINGS: A total of approximately 2.5 L of straw-colored fluid was removed. Samples were sent to the laboratory as requested by the clinical team. IMPRESSION: Successful ultrasound-guided therapeutic and diagnostic left-sided paracentesis yielding 2 point liters of peritoneal fluid. Performed by Reagan Camera NP Electronically Signed   By: Fernando Hoyer M.D.   On: 09/24/2023 12:44   CT ABDOMEN PELVIS W CONTRAST Result Date: 09/16/2023 CLINICAL DATA:  Abdominal pain, acute, nonlocalized. * Tracking Code: BO * EXAM: CT ABDOMEN AND PELVIS WITH CONTRAST TECHNIQUE: Multidetector CT imaging of the abdomen and pelvis was performed using the standard protocol following bolus administration of intravenous contrast. RADIATION  DOSE REDUCTION: This exam was performed according to the departmental dose-optimization program which includes automated exposure control, adjustment of the mA and/or kV according to patient size and/or use of iterative reconstruction technique. CONTRAST:  OMNIPAQUE  IOHEXOL  300 MG/ML  SOLN COMPARISON:  None Available. FINDINGS: Lower chest: There are at least 3, sub 4 mm, solid noncalcified nodules in the visualized bilateral lungs with largest measuring up to 3.4 x 4.0 mm in the right lung lower lobe (series 4, image 1), partially seen. Please see follow-up recommendations below. There are patchy atelectatic changes in the visualized lung bases. No overt consolidation. No pleural effusion. The heart is normal in size. No pericardial effusion. Hepatobiliary: The liver is normal in size. Non-cirrhotic configuration. There are at least 3, hypoattenuating masses in the right hepatic lobe with largest in the segment 6, subcapsular location measuring 3.5 x 4.5 cm, compatible with metastases. There also ill-defined is similar characteristic hypoattenuating areas surrounding the liver along the region of caudate lobe, highly concerning for serosal implants. There is an additional 2.1 x 2.2 cm hypoattenuating lesion in the left hepatic lobe, segments 2/4 a, which may represent a cyst. No intrahepatic or extrahepatic bile duct dilation. Small volume dependent calcified gallstones noted without imaging signs of acute cholecystitis. There is focal wall thickening near the fundus of the gallbladder, most likely secondary to underlying adenomyomatosis. Normal gallbladder wall thickness. No pericholecystic inflammatory changes. Pancreas: Small/atrophic pancreas. No discrete suspicious mass noted. Main pancreatic duct is not dilated. No peripancreatic fat stranding. Spleen: Normal size spleen. There are multiple hypoattenuating masses with largest along the lateral subcapsular area measuring 2.0 x 3.2 cm, highly concerning  for metastases. Adrenals/Urinary Tract: Adrenal glands are unremarkable. Bilateral kidneys are small/atrophic and exhibits persistent fetal lobulations. No hydroureteronephrosis or nephroureterolithiasis. Unremarkable urinary bladder. Stomach/Bowel: No disproportionate dilation of the small or large bowel loops. No evidence of abnormal bowel wall thickening or inflammatory changes. The appendix was not distinctly separately visualized; however there is no acute inflammatory process in the right lower quadrant. Vascular/Lymphatic: There is small to moderate ascites. There are extensive centrally necrotic omental implants as well as multiple irregular hyperattenuating nodules along the peritoneal reflections, compatible with extensive peritoneal carcinomatosis. There multiple mildly enlarged retroperitoneal lymph nodes (for example, series 2, images  36, 41, 48, 55, etc.), with largest lymph node measuring up to 1.1 x 1.8 cm, also highly concerning for metastases. 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 Reproductive: Limited evaluation on CT scan exam. Normal-size anteverted uterus noted. 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 There is mixed solid/cystic appearance of bilateral ovaries, which may be due to drop metastasis along the surface. However, correlate clinically and with tumor markers to determine the need for additional imaging with contrast-enhanced MRI pelvis. Other: There are tumor deposit along the undersurface of the bilateral hemidiaphragm, right more than left. There are small fat containing umbilical and right inguinal hernias. The soft tissues and abdominal wall are otherwise unremarkable. Musculoskeletal: No suspicious osseous lesions. There are mild multilevel degenerative changes in the visualized spine. IMPRESSION: 1. There are findings compatible with extensive omental and peritoneal carcinomatosis with small to  moderate ascites. There are multiple hypoattenuating masses in the liver and spleen, compatible with metastases. There are also multiple mildly enlarged retroperitoneal lymph nodes, also highly concerning for metastases. 2. There is mixed solid/cystic appearance of bilateral ovaries, which may be due to drop metastasis along the surface. However, correlate clinically and with tumor markers to determine the need for additional imaging with contrast-enhanced MRI pelvis. 3. There are at least 3, sub 4 mm, solid noncalcified nodules in the visualized bilateral lungs with largest measuring up to 3.4 x 4.0 mm in the right lung lower lobe, partially seen. In the given clinical context, these are also highly concerning for metastases. Consider further evaluation with dedicated chest CT and establish a baseline. 4. Multiple other nonacute observations, as described above. Electronically Signed   By: Beula Brunswick M.D.   On: 09/16/2023 12:10    Labs:  CBC: Recent Labs    09/16/23 1009 09/18/23 1609 09/26/23 1041  WBC 9.8 9.4 8.7  HGB 9.3* 9.1* 9.4*  HCT 29.1* 28.5* 29.2*  PLT 344 399 637*    COAGS: Recent Labs    09/26/23 1041  INR 1.1    BMP: Recent Labs    09/16/23 1009  NA 137  K 4.3  CL 104  CO2 22  GLUCOSE 112*  BUN 27*  CALCIUM 9.2  CREATININE 1.52*  GFRNONAA 36*    LIVER FUNCTION TESTS: Recent Labs    09/16/23 1009  BILITOT 0.6  AST 38  ALT 19  ALKPHOS 66  PROT 6.7  ALBUMIN 3.4*    TUMOR MARKERS: No results for input(s): "AFPTM", "CEA", "CA199", "CHROMGRNA" in the last 8760 hours.  Assessment and Plan:  Peritoneal carcinomatosis: Beth Hart is a 73 y.o. female with a history of CKD III and recent CT A/P with extensive omental and peritoneal carcinomatosis who presents to [facility]iology department for an image-guided peritoneal mass biopsy with Dr. Wilnette Haste on 09/26/23. Procedure to be performed under moderate sedation.  Risks and benefits  of peritoneal mass biopsy was discussed with the patient and/or patient's family including, but not limited to bleeding, infection, damage to adjacent structures or low yield requiring additional tests.  All of the questions were answered and there is agreement to proceed.  Consent signed and in chart.   Thank you for this interesting consult. I greatly enjoyed meeting Beth Hart and look forward to participating in their care. A copy of this report was sent to the requesting provider on this date.  Electronically Signed: Orson Blalock, PA-C 09/26/2023, 11:19 AM   I spent a  total of  30 Minutes in face to face clinical consultation, greater than 50% of which was counseling/coordinating care for abdominal mass biopsy.

## 2023-09-26 ENCOUNTER — Ambulatory Visit
Admission: RE | Admit: 2023-09-26 | Discharge: 2023-09-26 | Disposition: A | Discharge status: Home / Self Care | Source: Ambulatory Visit | Attending: Oncology | Admitting: Oncology

## 2023-09-26 VITALS — BP 119/81 | HR 73 | Temp 98.6°F | Resp 19 | Ht 62.0 in | Wt 156.3 lb

## 2023-09-26 DIAGNOSIS — C786 Secondary malignant neoplasm of retroperitoneum and peritoneum: Secondary | ICD-10-CM | POA: Diagnosis present

## 2023-09-26 DIAGNOSIS — R160 Hepatomegaly, not elsewhere classified: Secondary | ICD-10-CM | POA: Diagnosis not present

## 2023-09-26 DIAGNOSIS — R1900 Intra-abdominal and pelvic swelling, mass and lump, unspecified site: Secondary | ICD-10-CM | POA: Insufficient documentation

## 2023-09-26 DIAGNOSIS — R590 Localized enlarged lymph nodes: Secondary | ICD-10-CM | POA: Diagnosis not present

## 2023-09-26 DIAGNOSIS — N183 Chronic kidney disease, stage 3 unspecified: Secondary | ICD-10-CM | POA: Diagnosis not present

## 2023-09-26 DIAGNOSIS — R918 Other nonspecific abnormal finding of lung field: Secondary | ICD-10-CM | POA: Diagnosis not present

## 2023-09-26 LAB — CBC
HCT: 29.2 % — ABNORMAL LOW (ref 36.0–46.0)
Hemoglobin: 9.4 g/dL — ABNORMAL LOW (ref 12.0–15.0)
MCH: 27.8 pg (ref 26.0–34.0)
MCHC: 32.2 g/dL (ref 30.0–36.0)
MCV: 86.4 fL (ref 80.0–100.0)
Platelets: 637 10*3/uL — ABNORMAL HIGH (ref 150–400)
RBC: 3.38 MIL/uL — ABNORMAL LOW (ref 3.87–5.11)
RDW: 15.9 % — ABNORMAL HIGH (ref 11.5–15.5)
WBC: 8.7 10*3/uL (ref 4.0–10.5)
nRBC: 0 % (ref 0.0–0.2)

## 2023-09-26 LAB — PROTIME-INR
INR: 1.1 (ref 0.8–1.2)
Prothrombin Time: 14.4 s (ref 11.4–15.2)

## 2023-09-26 LAB — CYTOLOGY - NON PAP

## 2023-09-26 MED ORDER — FENTANYL CITRATE (PF) 100 MCG/2ML IJ SOLN
INTRAMUSCULAR | Status: AC | PRN
  Administered 2023-09-26: 50 ug via INTRAVENOUS

## 2023-09-26 MED ORDER — MIDAZOLAM HCL 2 MG/2ML IJ SOLN
INTRAMUSCULAR | Status: AC | PRN
  Administered 2023-09-26: 1 mg via INTRAVENOUS

## 2023-09-26 MED ORDER — MIDAZOLAM HCL 2 MG/2ML IJ SOLN
INTRAMUSCULAR | Status: AC
  Filled 2023-09-26: qty 2

## 2023-09-26 MED ORDER — FENTANYL CITRATE (PF) 100 MCG/2ML IJ SOLN
INTRAMUSCULAR | Status: AC
  Filled 2023-09-26: qty 2

## 2023-09-26 MED ORDER — SODIUM CHLORIDE 0.9 % IV SOLN: INTRAVENOUS | Status: DC

## 2023-09-26 MED ORDER — LIDOCAINE HCL (PF) 1 % IJ SOLN
10.0000 mL | Freq: Once | INTRAMUSCULAR | Status: AC
  Administered 2023-09-26: 10 mL via INTRADERMAL
  Filled 2023-09-26: qty 10

## 2023-09-26 NOTE — Progress Notes (Signed)
 Patient clinically stable post CT Abdominal mass biopsy per Dr Mabel Savage, tolerated well. Vitals stable pre and post procedure. Received Versed 1 mg along with Fentanyl 50 mcg IV for procedure. Post procedure, husband and friend brought to bedside with update given. Discharge instructions given to patient and family with questions answered. Denies complaints at this time.

## 2023-09-26 NOTE — Procedures (Signed)
 Interventional Radiology Procedure Note  Procedure:   CT guided biopsy of peritoneal mass  Complications: None EBL: None Specimen: Mx 18g core  Recommendations: - Bedrest 1 hours.   - Routine wound care - Follow up pathology - Advance diet  - DC home 1 hr  Signed,  Myrlene Asper, DO

## 2023-09-27 LAB — SURGICAL PATHOLOGY

## 2023-10-01 ENCOUNTER — Other Ambulatory Visit: Payer: Self-pay

## 2023-10-02 LAB — SURGICAL PATHOLOGY

## 2023-10-03 ENCOUNTER — Ambulatory Visit
Admission: RE | Admit: 2023-10-03 | Discharge: 2023-10-03 | Disposition: A | Discharge status: Home / Self Care | Source: Ambulatory Visit | Attending: Obstetrics and Gynecology | Admitting: Obstetrics and Gynecology

## 2023-10-03 DIAGNOSIS — R918 Other nonspecific abnormal finding of lung field: Secondary | ICD-10-CM | POA: Insufficient documentation

## 2023-10-03 DIAGNOSIS — C786 Secondary malignant neoplasm of retroperitoneum and peritoneum: Secondary | ICD-10-CM | POA: Diagnosis present

## 2023-10-03 MED ORDER — IOHEXOL 300 MG/ML  SOLN
60.0000 mL | Freq: Once | INTRAMUSCULAR | Status: AC | PRN
  Administered 2023-10-03: 60 mL via INTRAVENOUS

## 2023-10-04 ENCOUNTER — Encounter: Payer: Self-pay | Admitting: Oncology

## 2023-10-04 ENCOUNTER — Inpatient Hospital Stay: Attending: Oncology | Admitting: Oncology

## 2023-10-04 VITALS — BP 130/74 | HR 96 | Resp 18 | Ht 62.0 in | Wt 160.0 lb

## 2023-10-04 DIAGNOSIS — Z79633 Long term (current) use of mitotic inhibitor: Secondary | ICD-10-CM | POA: Diagnosis not present

## 2023-10-04 DIAGNOSIS — G620 Drug-induced polyneuropathy: Secondary | ICD-10-CM | POA: Diagnosis not present

## 2023-10-04 DIAGNOSIS — D649 Anemia, unspecified: Secondary | ICD-10-CM | POA: Diagnosis not present

## 2023-10-04 DIAGNOSIS — F32A Depression, unspecified: Secondary | ICD-10-CM | POA: Insufficient documentation

## 2023-10-04 DIAGNOSIS — C786 Secondary malignant neoplasm of retroperitoneum and peritoneum: Secondary | ICD-10-CM | POA: Insufficient documentation

## 2023-10-04 DIAGNOSIS — T451X5A Adverse effect of antineoplastic and immunosuppressive drugs, initial encounter: Secondary | ICD-10-CM | POA: Diagnosis not present

## 2023-10-04 DIAGNOSIS — C569 Malignant neoplasm of unspecified ovary: Secondary | ICD-10-CM | POA: Insufficient documentation

## 2023-10-04 DIAGNOSIS — C563 Malignant neoplasm of bilateral ovaries: Secondary | ICD-10-CM | POA: Insufficient documentation

## 2023-10-04 DIAGNOSIS — Z801 Family history of malignant neoplasm of trachea, bronchus and lung: Secondary | ICD-10-CM | POA: Insufficient documentation

## 2023-10-04 DIAGNOSIS — Z5111 Encounter for antineoplastic chemotherapy: Secondary | ICD-10-CM | POA: Insufficient documentation

## 2023-10-04 DIAGNOSIS — N183 Chronic kidney disease, stage 3 unspecified: Secondary | ICD-10-CM | POA: Insufficient documentation

## 2023-10-04 DIAGNOSIS — Z79630 Long term (current) use of alkylating agent: Secondary | ICD-10-CM | POA: Diagnosis not present

## 2023-10-04 DIAGNOSIS — K769 Liver disease, unspecified: Secondary | ICD-10-CM | POA: Insufficient documentation

## 2023-10-04 DIAGNOSIS — N1831 Chronic kidney disease, stage 3a: Secondary | ICD-10-CM

## 2023-10-04 DIAGNOSIS — R634 Abnormal weight loss: Secondary | ICD-10-CM | POA: Diagnosis not present

## 2023-10-04 DIAGNOSIS — D509 Iron deficiency anemia, unspecified: Secondary | ICD-10-CM | POA: Insufficient documentation

## 2023-10-04 NOTE — Progress Notes (Signed)
 START ON PATHWAY REGIMEN - Ovarian     A cycle is every 21 days:     Paclitaxel      Carboplatin   **Always confirm dose/schedule in your pharmacy ordering system**  Patient Characteristics: Preoperative or Nonsurgical Candidate (Clinical Staging), Newly Diagnosed, Neoadjuvant Therapy followed by Surgery BRCA Mutation Status: Awaiting Test Results Therapeutic Status: Preoperative or Nonsurgical Candidate (Clinical Staging) AJCC T Category: cT3c AJCC 8 Stage Grouping: IIIC AJCC N Category: cNX AJCC M Category: cM0 Therapy Plan: Neoadjuvant Therapy followed by Surgery Intent of Therapy: Non-Curative / Palliative Intent, Discussed with Patient

## 2023-10-04 NOTE — Progress Notes (Signed)
 Metallic taste in mouth  Trouble sleeping Cough and some mild SOB Left lower tightness and pulling sensation lower left abdomen

## 2023-10-04 NOTE — Progress Notes (Signed)
 [facility] requested on specimen [id number], collected 09/26/2023, peritoneum biopsy.

## 2023-10-05 ENCOUNTER — Encounter: Payer: Self-pay | Admitting: Oncology

## 2023-10-05 NOTE — Assessment & Plan Note (Signed)
 Encouraged oral hydration, avoid nephrotoxins. Recent IV contrast study may worsen kidney function.

## 2023-10-05 NOTE — Assessment & Plan Note (Signed)
 Patient has normal folate level.  B12 is within normal limit, less than 100. Lab Results  Component Value Date   HGB 9.4 (L) 09/26/2023   TIBC 314 09/18/2023   IRONPCTSAT 4 (L) 09/18/2023   FERRITIN 197 09/18/2023    Recommend to start oral iron supplementation.

## 2023-10-05 NOTE — Assessment & Plan Note (Addendum)
 Image findings and pathology results were reviewed and discussed with patient. 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  Normal CEA.  Elevated CA125 609, elevated CA 27.29 -415 CT chest official reading is pending. Recommended neoadjuvant chemotherapy with carboplatin/Taxol +/- Bevacizumab for 3-6 cycles followed by reimaging, followed by possible debulking surgery.  I explained to the patient the risks and benefits of chemotherapy including all but not limited to infusion reaction, hair loss, hearing loss, mouth sore, nausea, vomiting, low blood counts, bleeding, heart failure, kidney failure and risk of life threatening infection and even death, secondary malignancy etc.   Patient voices understanding and willing to proceed chemotherapy.   # Chemotherapy education; option of Medi port placement was discussed with patient.  She will start with peripheral IV access.Antiemetics-Zofran and Compazine; dexamethasone sent to pharmacy  # Refer to genetic counselor # Check HRD   Supportive care measures are necessary for patient well-being and will be provided as necessary. We spent sufficient time to discuss many aspect of care, questions were answered to patient's satisfaction.

## 2023-10-05 NOTE — Progress Notes (Signed)
 Hematology/Oncology Progress note [phone] [phone]           REFERRING PROVIDER: Jacklin Mascot, MD   CHIEF COMPLAINTS/REASON FOR VISIT:  High-grade serous carcinoma peritoneal carcinomatosis, liver lesion.   ASSESSMENT & PLAN:   Ovarian cancer [facility]) Image findings and pathology results were reviewed and discussed with patient. 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  Normal CEA.  Elevated CA125 609, elevated CA 27.29 -415 CT chest official reading is pending. Recommended neoadjuvant chemotherapy with carboplatin/Taxol +/- Bevacizumab for 3-6 cycles followed by reimaging, followed by possible debulking surgery.  I explained to the patient the risks and benefits of chemotherapy including all but not limited to infusion reaction, hair loss, hearing loss, mouth sore, nausea, vomiting, low blood counts, bleeding, heart failure, kidney failure and risk of life threatening infection and even death, secondary malignancy etc.   Patient voices understanding and willing to proceed chemotherapy.   # Chemotherapy education; option of Medi port placement was discussed with patient.  She will start with peripheral IV access.Antiemetics-Zofran and Compazine; dexamethasone sent to pharmacy  # Refer to genetic counselor # Check HRD   Supportive care measures are necessary for patient well-being and will be provided as necessary. We spent sufficient time to discuss many aspect of care, questions were answered to patient's satisfaction.   CKD (chronic kidney disease) stage 3, GFR 30-59 ml/min (HCC) Encouraged oral hydration, avoid nephrotoxins. Recent IV contrast study may worsen kidney function.  Normocytic anemia Patient has normal folate level.  B12 is within normal limit, less than 100. Lab Results  Component Value Date   HGB 9.4 (L) 09/26/2023   TIBC 314  09/18/2023   IRONPCTSAT 4 (L) 09/18/2023   FERRITIN 197 09/18/2023    Recommend to start oral iron supplementation.   Orders Placed This Encounter  Procedures   CBC with Differential (Cancer Center Only)    Standing Status:   Future    Expected Date:   10/10/2023    Expiration Date:   10/09/2024   CMP (Cancer Center only)    Standing Status:   Future    Expected Date:   10/10/2023    Expiration Date:   10/09/2024   Ambulatory referral to Genetics    Referral Priority:   Routine    Referral Type:   Consultation    Referral Reason:   Specialty Services Required    Referred to Provider:   Valri Gee T    Number of Visits Requested:   1   Follow-up 1-2 weeks to start treatment.  All questions were answered. The patient knows to call the clinic with any problems, questions or concerns.  Timmy Forbes, MD, PhD [facility] Hematology Oncology 10/04/2023   HISTORY OF PRESENTING ILLNESS:   Beth Hart is a  73 y.o.  female with PMH listed below was seen in consultation at the request of  Bair, Kalpana, MD  for evaluation of peritoneal carcinomatosis, liver lesion.  Oncology History  Peritoneal carcinomatosis (HCC)  09/18/2023 Initial Diagnosis   Peritoneal carcinomatosis (HCC)   10/10/2023 -  Chemotherapy   Patient is on Treatment Plan : OVARIAN Carboplatin (AUC 6) + Paclitaxel (175) q21d X 6 Cycles     Ovarian cancer (HCC)  09/25/2023 Cancer Staging   Staging form: Ovary, Fallopian Tube, and Primary Peritoneal Carcinoma, AJCC 8th Edition - Clinical stage from 09/25/2023: FIGO Stage IV (cT3c, cN1b, cM1) - Signed by Timmy Forbes, MD  on 10/05/2023 Stage prefix: Initial diagnosis   10/04/2023 Initial Diagnosis   Ovarian cancer  She experiences pressure and sharp pain in her abdomen, initially attributing it to a pulled muscle from yard work. The pain persisted and was accompanied by a fever, prompting her to visit the ER on 09/16/2023. The fever was around 100F at home and remained at  that level upon arrival at the ER. She has a persistent cough, especially when lying down.   09/16/2023, CT abdomen pelvis with contrast showed extensive omental and peritoneal carcinomatosis with small to moderate ascites. multiple hypoattenuating masses in the liver and spleen, compatible with metastases. There are also multiple mildly enlarged retroperitoneal lymph nodes, also highly concerning for metastases.  09/24/2023, cytology from peritoneal ascites showed negative for malignancy. 09/25/2023, endometrial biopsy showed no malignancy. 09/26/2023 Patient underwent peritoneal mass biopsy,  1. Peritoneum, biopsy, RUQ mass :       - METASTATIC HIGH-GRADE SEROUS CARCINOMA WITH NECROSIS, SEE NOTE   Diagnosis Note : Immunohistochemical stains show that the tumor cells are positive for PAX8, p16 and p53 (clonal overexpression pattern) while they are negative for p63 and CK5/6.  This immunoprofile is consistent with above interpretation.       10/10/2023 -  Chemotherapy   Patient is on Treatment Plan : OVARIAN Carboplatin (AUC 6) + Paclitaxel (175) q21d X 6 Cycles      Her family history includes father who had lung cancer attributed to smoking, but no history of colon, ovarian, or breast cancer.  She underwent a colonoscopy a few years ago, which was reported to be negative.  And a mammogram six months ago, which was negative she does not recall any recent stomach pain, dysphagia, or acid reflux.   Patient presents to discuss biopsy results, management plan. She feels that abdominal distention has improved after paracentesis, she feels that fluid has reaccumulated mildly, not needed for another paracentesis yet.  Left lower quadrant discomfort/pain, she takes Tylenol as needed with good pain control.   MEDICAL HISTORY:  Past Medical History:  Diagnosis Date   Anemia    Arthritis    Asthma    as a child   Chicken pox    Colon polyps 2012   Depression    Hemorrhoids     SURGICAL  HISTORY: Past Surgical History:  Procedure Laterality Date   BREAST CYST ASPIRATION Left 90s   BREAST SURGERY  2007   aspiration   CARPAL TUNNEL RELEASE  2010   REFRACTIVE SURGERY     TONSILLECTOMY      SOCIAL HISTORY: Social History   Socioeconomic History   Marital status: Married    Spouse name: Not on file   Number of children: Not on file   Years of education: Not on file   Highest education level: Not on file  Occupational History   Not on file  Tobacco Use   Smoking status: Never   Smokeless tobacco: Never  Vaping Use   Vaping status: Never Used  Substance and Sexual Activity   Alcohol use: No   Drug use: No   Sexual activity: Yes  Other Topics Concern   Not on file  Social History Narrative   married   Social Drivers of Corporate investment banker Strain: Low Risk  (06/03/2023)   Overall Financial Resource Strain (CARDIA)    Difficulty of Paying Living Expenses: Not hard at all  Food Insecurity: No Food Insecurity (09/25/2023)   Hunger Vital Sign    Worried  About Running Out of Food in the Last Year: Never true    Ran Out of Food in the Last Year: Never true  Transportation Needs: No Transportation Needs (09/25/2023)   PRAPARE - Administrator, Civil Service (Medical): No    Lack of Transportation (Non-Medical): No  Physical Activity: Insufficiently Active (06/03/2023)   Exercise Vital Sign    Days of Exercise per Week: 3 days    Minutes of Exercise per Session: 10 min  Stress: No Stress Concern Present (06/03/2023)   [employer] of Occupational Health - Occupational Stress Questionnaire    Feeling of Stress : Not at all  Social Connections: Socially Integrated (06/03/2023)   Social Connection and Isolation Panel [NHANES]    Frequency of Communication with Friends and Family: More than three times a week    Frequency of Social Gatherings with Friends and Family: Three times a week    Attends Religious Services: More than 4 times per year     Active Member of Clubs or Organizations: Yes    Attends Banker Meetings: More than 4 times per year    Marital Status: Married  Catering manager Violence: Not At Risk (09/25/2023)   Humiliation, Afraid, Rape, and Kick questionnaire    Fear of Current or Ex-Partner: No    Emotionally Abused: No    Physically Abused: No    Sexually Abused: No    FAMILY HISTORY: Family History  Problem Relation Age of Onset   Alzheimer's disease Mother    Lung cancer Father    Breast cancer Neg Hx     ALLERGIES:  has no known allergies.  MEDICATIONS:  Current Outpatient Medications  Medication Sig Dispense Refill   citalopram  (CELEXA ) 20 MG tablet Take 1 tablet (20 mg total) by mouth daily. 45 tablet 0   Calcium Carb-Cholecalciferol (CALCIUM 1000 + D PO) Take by mouth. (Patient not taking: Reported on 10/04/2023)     Cholecalciferol (VITAMIN D-3) 125 MCG (5000 UT) TABS Take by mouth. (Patient not taking: Reported on 10/04/2023)     Multiple Vitamin (MULTIVITAMIN) tablet Take 1 tablet by mouth daily. (Patient not taking: Reported on 10/04/2023)     No current facility-administered medications for this visit.    Review of Systems  Constitutional:  Negative for appetite change, chills, fatigue and fever.  HENT:   Negative for hearing loss and voice change.   Eyes:  Negative for eye problems.  Respiratory:  Negative for chest tightness and cough.   Cardiovascular:  Negative for chest pain.  Gastrointestinal:  Positive for abdominal pain. Negative for abdominal distention and blood in stool.  Endocrine: Negative for hot flashes.  Genitourinary:  Negative for difficulty urinating and frequency.   Musculoskeletal:  Negative for arthralgias.  Skin:  Negative for itching and rash.  Neurological:  Negative for extremity weakness.  Hematological:  Negative for adenopathy.  Psychiatric/Behavioral:  Negative for confusion.    PHYSICAL EXAMINATION: ECOG PERFORMANCE STATUS: 0 -  Asymptomatic Vitals:   10/04/23 1240  BP: 130/74  Pulse: 96  Resp: 18  SpO2: 97%   Filed Weights   10/04/23 1234  Weight: 160 lb (72.6 kg)    Physical Exam Constitutional:      General: She is not in acute distress. HENT:     Head: Normocephalic and atraumatic.  Eyes:     General: No scleral icterus. Cardiovascular:     Rate and Rhythm: Normal rate and regular rhythm.     Heart sounds: Normal  heart sounds.  Pulmonary:     Effort: Pulmonary effort is normal. No respiratory distress.     Breath sounds: No wheezing.  Abdominal:     General: Bowel sounds are normal. There is no distension.     Palpations: Abdomen is soft.  Musculoskeletal:        General: No deformity. Normal range of motion.     Cervical back: Normal range of motion and neck supple.  Skin:    General: Skin is warm and dry.     Findings: No erythema or rash.  Neurological:     Mental Status: She is alert and oriented to person, place, and time. Mental status is at baseline.  Psychiatric:        Mood and Affect: Mood normal.     LABORATORY DATA:  I have reviewed the data as listed    Latest Ref Rng & Units 09/26/2023   10:41 AM 09/18/2023    4:09 PM 09/16/2023   10:09 AM  CBC  WBC 4.0 - 10.5 K/uL 8.7  9.4  9.8   Hemoglobin 12.0 - 15.0 g/dL 9.4  9.1  9.3   Hematocrit 36.0 - 46.0 % 29.2  28.5  29.1   Platelets 150 - 400 K/uL 637  399  344       Latest Ref Rng & Units 09/16/2023   10:09 AM 08/17/2022    8:28 AM 08/15/2021    8:25 AM  CMP  Glucose 70 - 99 mg/dL 161     BUN 8 - 23 mg/dL 27     Creatinine 0.96 - 1.00 mg/dL 0.45     Sodium 409 - 811 mmol/L 137     Potassium 3.5 - 5.1 mmol/L 4.3     Chloride 98 - 111 mmol/L 104     CO2 22 - 32 mmol/L 22     Calcium 8.9 - 10.3 mg/dL 9.2     Total Protein 6.5 - 8.1 g/dL 6.7  6.4  6.6   Total Bilirubin 0.0 - 1.2 mg/dL 0.6  0.4  0.4   Alkaline Phos 38 - 126 U/L 66  74  70   AST 15 - 41 U/L 38  19  18   ALT 0 - 44 U/L 19  12  13         RADIOGRAPHIC STUDIES: I have personally reviewed the radiological images as listed and agreed with the findings in the report. CT ABDOMINAL MASS BIOPSY Result Date: 09/26/2023 INDICATION: 73 year old female with peritoneal carcinomatosis, presents for biopsy EXAM: CT BIOPSY MEDICATIONS: None. ANESTHESIA/SEDATION: Moderate (conscious) sedation was employed during this procedure. A total of Versed  1.0 mg and Fentanyl  50 mcg was administered intravenously. Moderate Sedation Time: 20 minutes. The patient's level of consciousness and vital signs were monitored continuously by radiology nursing throughout the procedure under my direct supervision. FLUOROSCOPY TIME:  CT COMPLICATIONS: None PROCEDURE: Informed written consent was obtained from the patient after a thorough discussion of the procedural risks, benefits and alternatives. All questions were addressed. Maximal Sterile Barrier Technique was utilized including caps, mask, sterile gowns, sterile gloves, sterile drape, hand hygiene and skin antiseptic. A timeout was performed prior to the initiation of the procedure. Patient positioned supine on the CT gantry table. Scout CT acquired for planning purposes. The patient is prepped and draped in the usual sterile fashion. 1% lidocaine  was used for local anesthesia. Using CT guidance, introducer needle was advanced into the targeted mass in the left upper quadrant from an anterior approach.  Once we confirmed needle tip position multiple 18 gauge core biopsy were acquired. Specimen placed into formalin. Needle was removed and a final CT was performed. Patient tolerated the procedure well and remained hemodynamically stable throughout. No complications were encountered and no significant blood loss. IMPRESSION: Status post CT-guided biopsy of left upper quadrant peritoneal mass. Signed, Marciano Settles. Rexine Cater, RPVI [facility] Radiology Electronically Signed    By: Myrlene Asper D.O.   On: 09/26/2023 12:11   US  Paracentesis Result Date: 09/24/2023 INDICATION: 73 year old female. History of peritoneal carcinomatosis with recurrent ascites. Request is for therapeutic and diagnostic paracentesis EXAM: ULTRASOUND GUIDED THERAPEUTIC AND DIAGNOSTIC LEFT-SIDED PARACENTESIS MEDICATIONS: lidocaine  1% 10 mL COMPLICATIONS: None immediate. PROCEDURE: Informed written consent was obtained from the patient after a discussion of the risks, benefits and alternatives to treatment. A timeout was performed prior to the initiation of the procedure. Initial ultrasound scanning demonstrates a small amount of ascites within the left lower abdominal quadrant. The left lower abdomen was prepped and draped in the usual sterile fashion. 1% lidocaine  was used for local anesthesia. Following this, a 19 gauge, 7-cm, Yueh catheter was introduced. An ultrasound image was saved for documentation purposes. The paracentesis was performed. The catheter was removed and a dressing was applied. The patient tolerated the procedure well without immediate post procedural complication. FINDINGS: A total of approximately 2.5 L of straw-colored fluid was removed. Samples were sent to the laboratory as requested by the clinical team. IMPRESSION: Successful ultrasound-guided therapeutic and diagnostic left-sided paracentesis yielding 2 point liters of peritoneal fluid. Performed by Reagan Camera NP Electronically Signed   By: Fernando Hoyer M.D.   On: 09/24/2023 12:44   CT ABDOMEN PELVIS W CONTRAST Result Date: 09/16/2023 CLINICAL DATA:  Abdominal pain, acute, nonlocalized. * Tracking Code: BO * EXAM: CT ABDOMEN AND PELVIS WITH CONTRAST TECHNIQUE: Multidetector CT imaging of the abdomen and pelvis was performed using the standard protocol following bolus administration of intravenous contrast. RADIATION DOSE REDUCTION: This exam was performed according to the departmental dose-optimization program which  includes automated exposure control, adjustment of the mA and/or kV according to patient size and/or use of iterative reconstruction technique. CONTRAST:  100mL OMNIPAQUE  IOHEXOL  300 MG/ML  SOLN COMPARISON:  None Available. FINDINGS: Lower chest: There are at least 3, sub 4 mm, solid noncalcified nodules in the visualized bilateral lungs with largest measuring up to 3.4 x 4.0 mm in the right lung lower lobe (series 4, image 1), partially seen. Please see follow-up recommendations below. There are patchy atelectatic changes in the visualized lung bases. No overt consolidation. No pleural effusion. The heart is normal in size. No pericardial effusion. Hepatobiliary: The liver is normal in size. Non-cirrhotic configuration. There are at least 3, hypoattenuating masses in the right hepatic lobe with largest in the segment 6, subcapsular location measuring 3.5 x 4.5 cm, compatible with metastases. There also ill-defined is similar characteristic hypoattenuating areas surrounding the liver along the region of caudate lobe, highly concerning for serosal implants. There is an additional 2.1 x 2.2 cm hypoattenuating lesion in the left hepatic lobe, segments 2/4 a, which may represent a cyst. No intrahepatic or extrahepatic bile duct dilation. Small volume dependent calcified gallstones noted without imaging signs of acute cholecystitis. There is focal wall thickening near the fundus of the gallbladder, most likely secondary to underlying adenomyomatosis. Normal gallbladder wall thickness. No pericholecystic inflammatory changes. Pancreas: Small/atrophic pancreas. No discrete suspicious mass noted. Main pancreatic duct is not dilated. No  peripancreatic fat stranding. Spleen: Normal size spleen. There are multiple hypoattenuating masses with largest along the lateral subcapsular area measuring 2.0 x 3.2 cm, highly concerning for metastases. Adrenals/Urinary Tract: Adrenal glands are unremarkable. Bilateral kidneys are  small/atrophic and exhibits persistent fetal lobulations. No hydroureteronephrosis or nephroureterolithiasis. Unremarkable urinary bladder. Stomach/Bowel: No disproportionate dilation of the small or large bowel loops. No evidence of abnormal bowel wall thickening or inflammatory changes. The appendix was not distinctly separately visualized; however there is no acute inflammatory process in the right lower quadrant. Vascular/Lymphatic: There is small to moderate ascites. There are extensive centrally necrotic omental implants as well as multiple irregular hyperattenuating nodules along the peritoneal reflections, compatible with extensive peritoneal carcinomatosis. There multiple mildly enlarged retroperitoneal lymph nodes (for example, series 2, images 36, 41, 48, 55, etc.), with largest lymph node measuring up to 1.1 x 1.8 cm, also highly concerning for metastases. 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 Reproductive: Limited evaluation on CT scan exam. Normal-size anteverted uterus noted. 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 There is mixed solid/cystic appearance of bilateral ovaries, which may be due to drop metastasis along the surface. However, correlate clinically and with tumor markers to determine the need for additional imaging with contrast-enhanced MRI pelvis. Other: There are tumor deposit along the undersurface of the bilateral hemidiaphragm, right more than left. There are small fat containing umbilical and right inguinal hernias. The soft tissues and abdominal wall are otherwise unremarkable. Musculoskeletal: No suspicious osseous lesions. There are mild multilevel degenerative changes in the visualized spine. IMPRESSION: 1. There are findings compatible with extensive omental and peritoneal carcinomatosis with small to moderate ascites. There are multiple hypoattenuating masses in the liver and spleen,  compatible with metastases. There are also multiple mildly enlarged retroperitoneal lymph nodes, also highly concerning for metastases. 2. There is mixed solid/cystic appearance of bilateral ovaries, which may be due to drop metastasis along the surface. However, correlate clinically and with tumor markers to determine the need for additional imaging with contrast-enhanced MRI pelvis. 3. There are at least 3, sub 4 mm, solid noncalcified nodules in the visualized bilateral lungs with largest measuring up to 3.4 x 4.0 mm in the right lung lower lobe, partially seen. In the given clinical context, these are also highly concerning for metastases. Consider further evaluation with dedicated chest CT and establish a baseline. 4. Multiple other nonacute observations, as described above. Electronically Signed   By: Beula Brunswick M.D.   On: 09/16/2023 12:10

## 2023-10-06 ENCOUNTER — Other Ambulatory Visit: Payer: Self-pay

## 2023-10-07 ENCOUNTER — Inpatient Hospital Stay

## 2023-10-07 DIAGNOSIS — C563 Malignant neoplasm of bilateral ovaries: Secondary | ICD-10-CM

## 2023-10-07 NOTE — Progress Notes (Signed)
 [facility] CSW Progress Note  Clinical Social Work introduced self to patient during Patient Education with Octavio Ben, Charity fundraiser.  Provided information regarding CSW role, including counseling, advanced care planning and support group.  Answered questions as needed.  Kennth Peal, LCSW Clinical Social Worker [facility]

## 2023-10-07 NOTE — Progress Notes (Signed)
 Pharmacist Chemotherapy Monitoring - Initial Assessment    Anticipated start date: 10/10/23   The following has been reviewed per standard work regarding the patient's treatment regimen: The patient's diagnosis, treatment plan and drug doses, and organ/hematologic function Lab orders and baseline tests specific to treatment regimen  The treatment plan start date, drug sequencing, and pre-medications Prior authorization status  Patient's documented medication list, including drug-drug interaction screen and prescriptions for anti-emetics and supportive care specific to the treatment regimen The drug concentrations, fluid compatibility, administration routes, and timing of the medications to be used The patient's access for treatment and lifetime cumulative dose history, if applicable  The patient's medication allergies and previous infusion related reactions, if applicable   Changes made to treatment plan:  N/A  Follow up needed:  N/A   Glendora Landsman, PharmD, BCPS Clinical Pharmacist   10/07/2023  3:39 PM

## 2023-10-08 ENCOUNTER — Other Ambulatory Visit: Payer: Self-pay

## 2023-10-09 ENCOUNTER — Telehealth: Payer: Self-pay | Admitting: Obstetrics and Gynecology

## 2023-10-09 ENCOUNTER — Inpatient Hospital Stay

## 2023-10-09 ENCOUNTER — Other Ambulatory Visit: Payer: Self-pay | Admitting: Licensed Clinical Social Worker

## 2023-10-09 ENCOUNTER — Inpatient Hospital Stay: Admitting: Licensed Clinical Social Worker

## 2023-10-09 ENCOUNTER — Encounter: Payer: Self-pay | Admitting: Licensed Clinical Social Worker

## 2023-10-09 ENCOUNTER — Other Ambulatory Visit (HOSPITAL_BASED_OUTPATIENT_CLINIC_OR_DEPARTMENT_OTHER): Payer: Self-pay | Admitting: Nurse Practitioner

## 2023-10-09 DIAGNOSIS — C786 Secondary malignant neoplasm of retroperitoneum and peritoneum: Secondary | ICD-10-CM

## 2023-10-09 DIAGNOSIS — C563 Malignant neoplasm of bilateral ovaries: Secondary | ICD-10-CM

## 2023-10-09 DIAGNOSIS — Z8042 Family history of malignant neoplasm of prostate: Secondary | ICD-10-CM

## 2023-10-09 DIAGNOSIS — Z5111 Encounter for antineoplastic chemotherapy: Secondary | ICD-10-CM | POA: Diagnosis not present

## 2023-10-09 LAB — GENETIC SCREENING ORDER

## 2023-10-09 NOTE — Progress Notes (Signed)
 Tumor Board Documentation  Beth Hart was presented by Beth Dubs, RN at our Tumor Board on 10/09/2023, which included representatives from medical oncology, radiation oncology, surgical oncology, palliative care, pathology.  Beth Hart currently presents as a new patient with history of the following: diagnosed with peritoneal carcinomatosis, liver lesions, splenic lesion, enlarged lymph nodes and elevated CA27.29 and CA125. Endometrial biopsy was negative. Stage V disease based on liver and splenic lesions.   Additionally, we reviewed previous medical and familial history, history of present illness, and recent lab results along with all available histopathologic and imaging studies. The tumor board considered available treatment options and made the following recommendations: Neoadjuvant chemotherapy.  HRD has been ordered. Positive for ER. Plan to start carbo-taxol this week without bevacizumab. Some question if use of bev translates to OS. European study showed some improvement in patients with stage IV disease however. Plan for 3 cycles of neoadjuvant chemotherapy then if bev is used then to stop prior to surgery for interval debulking. I will reach out to radiology to determine if there is direct bowel involvement with disease of pelvis. If yes, hold bev. If no, plan to give bev at cycle 2 & 3.   The following procedures/referrals were also placed: No orders of the defined types were placed in this encounter.  Clinical Trial Status: not discussed   National site-specific guidelines NCCN were discussed with respect to the case.  Tumor board is a meeting of clinicians from various specialty areas who evaluate and discuss patients for whom a multidisciplinary approach is being considered. Final determinations in the plan of care are those of the provider(s). The responsibility for follow up of recommendations given during tumor board is that of the provider.   Today's extended care,  comprehensive team conference, Beth Hart was not present for the discussion and was not examined.

## 2023-10-09 NOTE — Telephone Encounter (Signed)
 I called Beth Hart regarding her endometrial biopsy results which are noted below.  These findings are reassuring and suggest that she does not have an endometrial cancer.  She is having her genetic counseling appointment today and she will start chemotherapy in 2 days.  We will arrange for follow-up for her to see us  right after 3 cycles neoadjuvant chemotherapy to discuss debulking surgery.  She also asked about her chest CT results which are still awaiting final interpretation.   Beth Preyer Iola Manila, MD    SURGICAL PATHOLOGY [facility] [address], [address] [place], [place] [address] Telephone [phone] or [phone] Fax [phone]  REPORT OF SURGICAL PATHOLOGY   Accession #: [id number] Patient Name: Beth Hart, Beth Hart Visit # : [id number]  MRN: [id number] Physician: Hermine Loots DOB/Age [birth year] (Age: 73) Gender: F Collected Date: 09/25/2023 Received Date: 09/26/2023  FINAL DIAGNOSIS       1. Endometrium, biopsy, pipelle BX :      - PREDOMINANTLY MUCOHEMORRHAGIC DEBRIS WITH SCANT BENIGN ENDOCERVICAL MUCOSA      - RARE FRAGMENT OF SUPERFICIAL ENDOMETRIUM      - NO DYSPLASIA OR MALIGNANCY IDENTIFIED       DATE SIGNED OUT: 09/27/2023 ELECTRONIC SIGNATURE : Almeda Jacobs M.D., Nupur, Pathologist, Electronic Signature  MICROSCOPIC DESCRIPTION  CASE COMMENTS STAINS USED IN DIAGNOSIS: H&E    CLINICAL HISTORY  SPECIMEN(S) OBTAINED 1. Endometrium, biopsy, Pipelle BX  SPECIMEN COMMENTS: SPECIMEN CLINICAL INFORMATION:    Gross Description 1. Embx, received in formalin is a 0.8 x 0.7 x 0.5 cm portion of white-gray, cloudy, gelatinous material. The specimen is submitted in toto in 1 block (1A).      [facility] 09/26/2023

## 2023-10-09 NOTE — Progress Notes (Signed)
 [facility] Psychosocial Distress Screening Clinical Social Work  GEET HOSKING is a 73 y.o. year old female contacted by phone. Clinical Social Work was referred by nurse for positive distress screening. The patient scored a 6 on the Psychosocial Distress Thermometer which indicates moderate distress. Clinical Social Worker contacted patient by phone to assess for distress and other psychosocial needs.     Distress Screen:    10/07/2023   12:18 PM  ONCBCN DISTRESS SCREENING  Screening Type Initial Screening  How much distress have you been experiencing in the past week? (0-10) 6     CSW and patient discussed common feeling and emotions when being diagnosed with cancer, and the importance of support during treatment.  CSW informed patient of the support team and support services at [facility].  CSW provided contact information and encouraged patient to call with any questions or concerns.   Follow Up Plan: CSW will follow-up with patient by phone  Patient verbalizes understanding of plan: Yes    Freya Jesus Daegon Deiss, LCSW

## 2023-10-09 NOTE — Progress Notes (Signed)
 REFERRING PROVIDER: Timmy Forbes, MD [address] [place],  [place] [address]  PRIMARY PROVIDER:  Jacklin Mascot, MD  PRIMARY REASON FOR VISIT:  1. Malignant neoplasm of both ovaries (HCC)   2. Family history of prostate cancer     HISTORY OF PRESENT ILLNESS:   Ms. Beshears, a 73 y.o. female, was seen for a [place] cancer genetics consultation at the request of Dr. Wilhelmenia Harada due to a personal history of ovarian cancer.  Ms. Sanguinetti presents to clinic today to discuss the possibility of a hereditary predisposition to cancer, genetic testing, and to further clarify her future cancer risks, as well as potential cancer risks for family members.   CANCER HISTORY:  Oncology History  Peritoneal carcinomatosis (HCC)  09/18/2023 Initial Diagnosis   Peritoneal carcinomatosis (HCC)   10/10/2023 -  Chemotherapy   Patient is on Treatment Plan : OVARIAN Carboplatin (AUC 6) + Paclitaxel (175) q21d X 6 Cycles     Ovarian cancer (HCC)  09/25/2023 Cancer Staging   Staging form: Ovary, Fallopian Tube, and Primary Peritoneal Carcinoma, AJCC 8th Edition - Clinical stage from 09/25/2023: FIGO Stage IV (cT3c, cN1b, cM1) - Signed by Timmy Forbes, MD on 10/05/2023 Stage prefix: Initial diagnosis   10/04/2023 Initial Diagnosis   Ovarian cancer  She experiences pressure and sharp pain in her abdomen, initially attributing it to a pulled muscle from yard work. The pain persisted and was accompanied by a fever, prompting her to visit the ER on 09/16/2023. The fever was around 100F at home and remained at that level upon arrival at the ER. She has a persistent cough, especially when lying down.   09/16/2023, CT abdomen pelvis with contrast showed extensive omental and peritoneal carcinomatosis with small to moderate ascites. multiple hypoattenuating masses in the liver and spleen, compatible with metastases. There are also multiple mildly enlarged retroperitoneal lymph nodes, also highly concerning for  metastases.  09/24/2023, cytology from peritoneal ascites showed negative for malignancy. 09/25/2023, endometrial biopsy showed no malignancy. 09/26/2023 Patient underwent peritoneal mass biopsy,  1. Peritoneum, biopsy, RUQ mass :       - METASTATIC HIGH-GRADE SEROUS CARCINOMA WITH NECROSIS, SEE NOTE   Diagnosis Note : Immunohistochemical stains show that the tumor cells are positive for PAX8, p16 and p53 (clonal overexpression pattern) while they are negative for p63 and CK5/6.  This immunoprofile is consistent with above interpretation.       10/10/2023 -  Chemotherapy   Patient is on Treatment Plan : OVARIAN Carboplatin (AUC 6) + Paclitaxel (175) q21d X 6 Cycles       RISK FACTORS:  Menarche was at age 29.  First live birth at age 74.  Ovaries intact: yes.  Hysterectomy: no.  Menopausal status: postmenopausal.  Colonoscopy: yes; normal. Mammogram within the last year: yes.  Past Medical History:  Diagnosis Date   Anemia    Arthritis    Asthma    as a child   Chicken pox    Colon polyps 2012   Depression    Hemorrhoids     Past Surgical History:  Procedure Laterality Date   BREAST CYST ASPIRATION Left 90s   BREAST SURGERY  2007   aspiration   CARPAL TUNNEL RELEASE  2010   REFRACTIVE SURGERY     TONSILLECTOMY      FAMILY HISTORY:  We obtained a detailed, 4-generation family history.  Significant diagnoses are listed below: Family History  Problem Relation Age of Onset   Alzheimer's disease Mother  Lung cancer Father    Prostate cancer Maternal Uncle    Prostate cancer Maternal Uncle    Prostate cancer Maternal Uncle    Prostate cancer Maternal Uncle    Prostate cancer Maternal Uncle    Prostate cancer Other        in mat great uncles, unk#   Prostate cancer Cousin        in 4-5 mat  first cousins   Breast cancer Neg Hx     Ms. Dittman has 1 son, 28 and 2 grandsons. She has 1 brother, 46 and 2 nephews.   Ms. Stanforth mother passed at 30, no  cancer. She had 5 brothers who all had prostate cancer. Ms. Weyandt has 4-5 first cousins (sons of her uncles with prostate cancer) who also had prostate cancer. Maternal grandmother's brothers also had prostate cancer, unknown number of great uncles.   Ms. Jeanbaptiste father died of lung cancer at 11 and had history of smoking. No other known cancers on this side of the family.  Ms. Pillard is unaware of previous family history of genetic testing for hereditary cancer risks. There is no reported Ashkenazi Jewish ancestry. There is no known consanguinity.    GENETIC COUNSELING ASSESSMENT: Ms. Steptoe is a 73 y.o. female with a personal history of ovarian cancer which is somewhat suggestive of a hereditary cancer syndrome and predisposition to cancer. We, therefore, discussed and recommended the following at today's visit.   DISCUSSION: We discussed that approximately 10-20% of ovarian cancer is hereditary. Most cases of hereditary ovarian cancer are associated with BRCA1/BRCA2 genes, although there are other genes associated with hereditary cancer as well. Cancers and risks are gene specific. We discussed that testing is beneficial for several reasons including knowing about cancer risks, identifying potential screening and risk-reduction options that may be appropriate, and to understand if other family members could be at risk for cancer and allow them to undergo genetic testing.   We reviewed the characteristics, features and inheritance patterns of hereditary cancer syndromes. We also discussed genetic testing, including the appropriate family members to test, the process of testing, insurance coverage and turn-around-time for results. We discussed the implications of a negative, positive and/or variant of uncertain significant result. We recommended Ms. Whitham pursue genetic testing for the [facility] CancerNext+RNA gene panel.   The [facility] CancerNext+RNAinsight Panel includes sequencing,  rearrangement analysis, and RNA analysis for the following 40 genes: APC, ATM, BAP1, BARD1, BMPR1A, BRCA1, BRCA2, BRIP1, CDH1, CDKN2A, CHEK2, FH, FLCN, MET, MLH1, MSH2, MSH6, MUTYH, NF1, NTHL1, PALB2, PMS2, PTEN, RAD51C, RAD51D, RPS20, SMAD4, STK11, TP53, TSC1, TSC2, and VHL (sequencing and deletion/duplication); AXIN2, HOXB13, MBD4, MSH3, POLD1 and POLE (sequencing only); EPCAM and GREM1 (deletion/duplication only).  Based on Ms. Simonetti's personal history of cancer, she meets medical criteria for genetic testing. Despite that she meets criteria, she may still have an out of pocket cost.   PLAN: After considering the risks, benefits, and limitations, Ms. Leard provided informed consent to pursue genetic testing and the blood sample was sent to [facility] for analysis of the CancerNext+RNA panel. Results should be available within approximately 2-3 weeks' time, at which point they will be disclosed by telephone to Ms. Scardina, as will any additional recommendations warranted by these results. Ms. Keathley will receive a summary of her genetic counseling visit and a copy of her results once available. This information will also be available in Epic.   Ms. Huseman questions were answered to her satisfaction today. Our contact information  was provided should additional questions or concerns arise. Thank you for the referral and allowing us  to share in the care of your patient.   Valri Gee, MS, [facility] Genetic Counselor [email or web address] Phone: [phone]  45 minutes were spent on the date of the encounter in service to the patient including preparation, face-to-face consultation, documentation and care coordination. Patient's husband Ron was also present. Dr. Nelson Bandy was available for discussion regarding this case.   _______________________________________________________________________ For Office Staff:  Number of people involved in session: 2 Was an Intern/  student involved with case: no

## 2023-10-10 ENCOUNTER — Inpatient Hospital Stay

## 2023-10-10 ENCOUNTER — Inpatient Hospital Stay: Admitting: Oncology

## 2023-10-10 ENCOUNTER — Encounter: Payer: Self-pay | Admitting: Oncology

## 2023-10-10 ENCOUNTER — Other Ambulatory Visit: Payer: Self-pay

## 2023-10-10 ENCOUNTER — Other Ambulatory Visit: Payer: Self-pay | Admitting: Oncology

## 2023-10-10 VITALS — BP 134/69 | HR 77 | Resp 18

## 2023-10-10 VITALS — BP 132/69 | HR 74 | Temp 98.1°F | Resp 18 | Wt 159.1 lb

## 2023-10-10 DIAGNOSIS — C563 Malignant neoplasm of bilateral ovaries: Secondary | ICD-10-CM

## 2023-10-10 DIAGNOSIS — C786 Secondary malignant neoplasm of retroperitoneum and peritoneum: Secondary | ICD-10-CM

## 2023-10-10 DIAGNOSIS — K769 Liver disease, unspecified: Secondary | ICD-10-CM | POA: Diagnosis not present

## 2023-10-10 DIAGNOSIS — Z5111 Encounter for antineoplastic chemotherapy: Secondary | ICD-10-CM | POA: Diagnosis not present

## 2023-10-10 DIAGNOSIS — D500 Iron deficiency anemia secondary to blood loss (chronic): Secondary | ICD-10-CM

## 2023-10-10 DIAGNOSIS — D649 Anemia, unspecified: Secondary | ICD-10-CM

## 2023-10-10 DIAGNOSIS — N1831 Chronic kidney disease, stage 3a: Secondary | ICD-10-CM

## 2023-10-10 DIAGNOSIS — D509 Iron deficiency anemia, unspecified: Secondary | ICD-10-CM

## 2023-10-10 LAB — CMP (CANCER CENTER ONLY)
ALT: 13 U/L (ref 0–44)
AST: 21 U/L (ref 15–41)
Albumin: 2.8 g/dL — ABNORMAL LOW (ref 3.5–5.0)
Alkaline Phosphatase: 90 U/L (ref 38–126)
Anion gap: 12 (ref 5–15)
BUN: 27 mg/dL — ABNORMAL HIGH (ref 8–23)
CO2: 23 mmol/L (ref 22–32)
Calcium: 8.8 mg/dL — ABNORMAL LOW (ref 8.9–10.3)
Chloride: 105 mmol/L (ref 98–111)
Creatinine: 1.29 mg/dL — ABNORMAL HIGH (ref 0.44–1.00)
GFR, Estimated: 44 mL/min — ABNORMAL LOW (ref >60)
Glucose, Bld: 110 mg/dL — ABNORMAL HIGH (ref 70–99)
Potassium: 4.7 mmol/L (ref 3.5–5.1)
Sodium: 140 mmol/L (ref 135–145)
Total Bilirubin: 0.5 mg/dL (ref 0.0–1.2)
Total Protein: 6.6 g/dL (ref 6.5–8.1)

## 2023-10-10 LAB — CBC WITH DIFFERENTIAL (CANCER CENTER ONLY)
Abs Immature Granulocytes: 0.06 10*3/uL (ref 0.00–0.07)
Basophils Absolute: 0.1 10*3/uL (ref 0.0–0.1)
Basophils Relative: 1 %
Eosinophils Absolute: 0.2 10*3/uL (ref 0.0–0.5)
Eosinophils Relative: 2 %
HCT: 27.1 % — ABNORMAL LOW (ref 36.0–46.0)
Hemoglobin: 8.5 g/dL — ABNORMAL LOW (ref 12.0–15.0)
Immature Granulocytes: 1 %
Lymphocytes Relative: 19 %
Lymphs Abs: 1.5 10*3/uL (ref 0.7–4.0)
MCH: 26.9 pg (ref 26.0–34.0)
MCHC: 31.4 g/dL (ref 30.0–36.0)
MCV: 85.8 fL (ref 80.0–100.0)
Monocytes Absolute: 0.6 10*3/uL (ref 0.1–1.0)
Monocytes Relative: 8 %
Neutro Abs: 5.4 10*3/uL (ref 1.7–7.7)
Neutrophils Relative %: 69 %
Platelet Count: 470 10*3/uL — ABNORMAL HIGH (ref 150–400)
RBC: 3.16 MIL/uL — ABNORMAL LOW (ref 3.87–5.11)
RDW: 15.5 % (ref 11.5–15.5)
WBC Count: 7.7 10*3/uL (ref 4.0–10.5)
nRBC: 0 % (ref 0.0–0.2)

## 2023-10-10 LAB — PREPARE RBC (CROSSMATCH)

## 2023-10-10 LAB — ABO/RH: ABO/RH(D): O POS

## 2023-10-10 MED ORDER — SODIUM CHLORIDE 0.9% FLUSH
10.0000 mL | Freq: Once | INTRAVENOUS | Status: DC | PRN
Start: 2023-10-10 — End: 2023-10-10
  Filled 2023-10-10: qty 10

## 2023-10-10 MED ORDER — SODIUM CHLORIDE 0.9 % IV SOLN
INTRAVENOUS | Status: DC
  Filled 2023-10-10 (×3): qty 250

## 2023-10-10 MED ORDER — DIPHENHYDRAMINE HCL 50 MG/ML IJ SOLN
50.0000 mg | Freq: Once | INTRAMUSCULAR | Status: AC
  Administered 2023-10-10: 50 mg via INTRAVENOUS
  Filled 2023-10-10: qty 1

## 2023-10-10 MED ORDER — DEXAMETHASONE 4 MG PO TABS: ORAL_TABLET | ORAL | 1 refills | Status: DC

## 2023-10-10 MED ORDER — IRON SUCROSE 20 MG/ML IV SOLN
200.0000 mg | Freq: Once | INTRAVENOUS | Status: AC
  Administered 2023-10-10: 200 mg via INTRAVENOUS
  Filled 2023-10-10: qty 10

## 2023-10-10 MED ORDER — PROCHLORPERAZINE MALEATE 10 MG PO TABS: 10.0000 mg | ORAL_TABLET | Freq: Four times a day (QID) | ORAL | 1 refills | Status: DC | PRN

## 2023-10-10 MED ORDER — SODIUM CHLORIDE 0.9 % IV SOLN
175.0000 mg/m2 | Freq: Once | INTRAVENOUS | Status: AC
  Administered 2023-10-10: 312 mg via INTRAVENOUS
  Filled 2023-10-10: qty 52

## 2023-10-10 MED ORDER — DEXAMETHASONE SODIUM PHOSPHATE 10 MG/ML IJ SOLN
10.0000 mg | Freq: Once | INTRAMUSCULAR | Status: AC
  Administered 2023-10-10: 10 mg via INTRAVENOUS
  Filled 2023-10-10: qty 1

## 2023-10-10 MED ORDER — ONDANSETRON HCL 8 MG PO TABS
8.0000 mg | ORAL_TABLET | Freq: Three times a day (TID) | ORAL | 1 refills | Status: DC | PRN
Start: 2023-10-10 — End: 2024-03-19

## 2023-10-10 MED ORDER — SODIUM CHLORIDE 0.9 % IV SOLN
421.2000 mg | Freq: Once | INTRAVENOUS | Status: AC
  Administered 2023-10-10: 420 mg via INTRAVENOUS
  Filled 2023-10-10: qty 42

## 2023-10-10 MED ORDER — FAMOTIDINE IN NACL 20-0.9 MG/50ML-% IV SOLN
20.0000 mg | Freq: Once | INTRAVENOUS | Status: AC
  Administered 2023-10-10: 20 mg via INTRAVENOUS
  Filled 2023-10-10: qty 50

## 2023-10-10 MED ORDER — APREPITANT 130 MG/18ML IV EMUL
130.0000 mg | Freq: Once | INTRAVENOUS | Status: AC
  Administered 2023-10-10: 130 mg via INTRAVENOUS
  Filled 2023-10-10: qty 18

## 2023-10-10 MED ORDER — PALONOSETRON HCL INJECTION 0.25 MG/5ML
0.2500 mg | Freq: Once | INTRAVENOUS | Status: AC
  Administered 2023-10-10: 0.25 mg via INTRAVENOUS
  Filled 2023-10-10: qty 5

## 2023-10-10 NOTE — Assessment & Plan Note (Addendum)
 Labs are reviewed and discussed with patient. Lab Results  Component Value Date   HGB 8.5 (L) 10/10/2023   TIBC 314 09/18/2023   IRONPCTSAT 4 (L) 09/18/2023   FERRITIN 197 09/18/2023    Recommend IV venofer weekly x 2

## 2023-10-10 NOTE — Assessment & Plan Note (Signed)
 Chemotherapy treatments as listed above.

## 2023-10-10 NOTE — Assessment & Plan Note (Addendum)
 Encouraged oral hydration, avoid nephrotoxins.

## 2023-10-10 NOTE — Progress Notes (Signed)
 Hematology/Oncology Progress note [phone] [phone]    CHIEF COMPLAINTS/REASON FOR VISIT:  High-grade serous carcinoma of ovaries. peritoneal carcinomatosis, liver lesion.   ASSESSMENT & PLAN:    Cancer Staging  Ovarian cancer [facility]) Staging form: Ovary, Fallopian Tube, and Primary Peritoneal Carcinoma, AJCC 8th Edition - Clinical stage from 09/25/2023: FIGO Stage IV (cT3c, cN1b, cM1) - Signed by Timmy Forbes, MD on 10/05/2023   Ovarian cancer [facility]) Image findings and pathology results were reviewed and discussed with patient. 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  Normal CEA.  Elevated CA125 609, elevated CA 27.29 -415 CT chest official reading is pending. Recommended neoadjuvant chemotherapy with carboplatin/Taxol +/- Bevacizumab for 3-6 cycles followed by reimaging, followed by possible debulking surgery. Check HRD - pending Case was discussed on gynonc tumor board. Consnsus reached upon carboplatin and Taxol as neoadjuvant chemotherapy hold off Bevacizumab due to concern of tumor abutting/close to bowels.   Labs are reviewed and discussed with patient. Proceed with cycle 1 Carboplatin AUC 6 with Paclitaxel 175mg /m2  Discussed about antiemetics and steroid instructions.   CKD (chronic kidney disease) stage 3, GFR 30-59 ml/min (HCC) Encouraged oral hydration, avoid nephrotoxins.   Iron deficiency anemia Labs are reviewed and discussed with patient. Lab Results  Component Value Date   HGB 8.5 (L) 10/10/2023   TIBC 314 09/18/2023   IRONPCTSAT 4 (L) 09/18/2023   FERRITIN 197 09/18/2023    Recommend IV venofer weekly x 2    Encounter for antineoplastic chemotherapy Chemotherapy treatments as listed above.   Symptomatic anemia Anticipate hemoglobin will be lower after chemotherapy.  She is symptomatic Recommend 1 unit of PRBC transfusion.    Orders Placed This Encounter  Procedures   Consent Attestation for Oncology Treatment    The patient is informed of risks, benefits, side-effects of the prescribed oncology treatment. Potential short term and long term side effects and response rates discussed. After a long discussion, the patient made informed decision to proceed.:   Yes   CBC with Differential (Cancer Center Only)    Standing Status:   Future    Expected Date:   10/31/2023    Expiration Date:   10/30/2024   CMP (Cancer Center only)    Standing Status:   Future    Expected Date:   10/31/2023    Expiration Date:   10/30/2024   CBC (Cancer Center Only)    Standing Status:   Future    Expected Date:   10/17/2023    Expiration Date:   01/15/2024   CMP (Cancer Center only)    Standing Status:   Future    Expected Date:   10/17/2023    Expiration Date:   01/15/2024   CBC with Differential (Cancer Center Only)    Standing Status:   Future    Expected Date:   11/21/2023    Expiration Date:   11/20/2024   CMP (Cancer Center only)    Standing Status:   Future    Expected Date:   11/21/2023    Expiration Date:   11/20/2024   ABO/Rh    Standing Status:   Future    Number of Occurrences:   1    Expected Date:   10/10/2023    Expiration Date:   01/08/2024   Sample to Blood Bank    Standing Status:   Future    Expected Date:   10/10/2023    Expiration Date:  01/08/2024   Follow-up 1 week  All questions were answered. The patient knows to call the clinic with any problems, questions or concerns.  Timmy Forbes, MD, PhD [facility] Hematology Oncology 10/10/2023   HISTORY OF PRESENTING ILLNESS:   Beth Hart is a  73 y.o.  female with PMH listed below presents for follow up of stage IV ovarian high grade serous carcinoma.  Oncology History  Peritoneal carcinomatosis (HCC)  09/18/2023 Initial Diagnosis   Peritoneal carcinomatosis (HCC)   10/10/2023 -  Chemotherapy   Patient is on Treatment Plan : OVARIAN Carboplatin (AUC 6) +  Paclitaxel (175) q21d X 6 Cycles     Ovarian cancer (HCC)  09/25/2023 Cancer Staging   Staging form: Ovary, Fallopian Tube, and Primary Peritoneal Carcinoma, AJCC 8th Edition - Clinical stage from 09/25/2023: FIGO Stage IV (cT3c, cN1b, cM1) - Signed by Timmy Forbes, MD on 10/05/2023 Stage prefix: Initial diagnosis   10/04/2023 Initial Diagnosis   Ovarian cancer  She experiences pressure and sharp pain in her abdomen, initially attributing it to a pulled muscle from yard work. The pain persisted and was accompanied by a fever, prompting her to visit the ER on 09/16/2023. The fever was around 100F at home and remained at that level upon arrival at the ER. She has a persistent cough, especially when lying down.   09/16/2023, CT abdomen pelvis with contrast showed extensive omental and peritoneal carcinomatosis with small to moderate ascites. multiple hypoattenuating masses in the liver and spleen, compatible with metastases. There are also multiple mildly enlarged retroperitoneal lymph nodes, also highly concerning for metastases.  09/24/2023, cytology from peritoneal ascites showed negative for malignancy. 09/25/2023, endometrial biopsy showed no malignancy. 09/26/2023 Patient underwent peritoneal mass biopsy,  1. Peritoneum, biopsy, RUQ mass :       - METASTATIC HIGH-GRADE SEROUS CARCINOMA WITH NECROSIS, SEE NOTE   Diagnosis Note : Immunohistochemical stains show that the tumor cells are positive for PAX8, p16 and p53 (clonal overexpression pattern) while they are negative for p63 and CK5/6.  This immunoprofile is consistent with above interpretation.       10/10/2023 -  Chemotherapy   Patient is on Treatment Plan : OVARIAN Carboplatin (AUC 6) + Paclitaxel (175) q21d X 6 Cycles      Her family history includes father who had lung cancer attributed to smoking, but no history of colon, ovarian, or breast cancer.  She underwent a colonoscopy a few years ago, which was reported to be negative.  And a  mammogram six months ago, which was negative she does not recall any recent stomach pain, dysphagia, or acid reflux.  INTERVAL HISTORY Beth Hart is a 73 y.o. female who has above history reviewed by me today presents for follow up visit for Stage IV ovarian cancer.  Patient presents for evaluation prior to chemotherapy.  No new complaints.   MEDICAL HISTORY:  Past Medical History:  Diagnosis Date   Anemia    Arthritis    Asthma    as a child   Chicken pox    Colon polyps 2012   Depression    Hemorrhoids     SURGICAL HISTORY: Past Surgical History:  Procedure Laterality Date   BREAST CYST ASPIRATION Left 90s   BREAST SURGERY  2007   aspiration   CARPAL TUNNEL RELEASE  2010   REFRACTIVE SURGERY     TONSILLECTOMY      SOCIAL HISTORY: Social History   Socioeconomic History   Marital status: Married  Spouse name: Not on file   Number of children: Not on file   Years of education: Not on file   Highest education level: Not on file  Occupational History   Not on file  Tobacco Use   Smoking status: Never   Smokeless tobacco: Never  Vaping Use   Vaping status: Never Used  Substance and Sexual Activity   Alcohol use: No   Drug use: No   Sexual activity: Yes  Other Topics Concern   Not on file  Social History Narrative   married   Social Drivers of Corporate investment banker Strain: Low Risk  (06/03/2023)   Overall Financial Resource Strain (CARDIA)    Difficulty of Paying Living Expenses: Not hard at all  Food Insecurity: No Food Insecurity (09/25/2023)   Hunger Vital Sign    Worried About Running Out of Food in the Last Year: Never true    Ran Out of Food in the Last Year: Never true  Transportation Needs: No Transportation Needs (09/25/2023)   PRAPARE - Administrator, Civil Service (Medical): No    Lack of Transportation (Non-Medical): No  Physical Activity: Insufficiently Active (06/03/2023)   Exercise Vital Sign    Days of Exercise per  Week: 3 days    Minutes of Exercise per Session: 10 min  Stress: No Stress Concern Present (06/03/2023)   [employer] of Occupational Health - Occupational Stress Questionnaire    Feeling of Stress : Not at all  Social Connections: Socially Integrated (06/03/2023)   Social Connection and Isolation Panel    Frequency of Communication with Friends and Family: More than three times a week    Frequency of Social Gatherings with Friends and Family: Three times a week    Attends Religious Services: More than 4 times per year    Active Member of Clubs or Organizations: Yes    Attends Banker Meetings: More than 4 times per year    Marital Status: Married  Catering manager Violence: Not At Risk (09/25/2023)   Humiliation, Afraid, Rape, and Kick questionnaire    Fear of Current or Ex-Partner: No    Emotionally Abused: No    Physically Abused: No    Sexually Abused: No    FAMILY HISTORY: Family History  Problem Relation Age of Onset   Alzheimer's disease Mother    Lung cancer Father    Prostate cancer Maternal Uncle    Prostate cancer Maternal Uncle    Prostate cancer Maternal Uncle    Prostate cancer Maternal Uncle    Prostate cancer Maternal Uncle    Prostate cancer Other        in mat great uncles, unk#   Prostate cancer Cousin        in 4-5 mat  first cousins   Breast cancer Neg Hx     ALLERGIES:  has no known allergies.  MEDICATIONS:  Current Outpatient Medications  Medication Sig Dispense Refill   citalopram  (CELEXA ) 20 MG tablet Take 1 tablet (20 mg total) by mouth daily. 45 tablet 0   Calcium Carb-Cholecalciferol (CALCIUM 1000 + D PO) Take by mouth. (Patient not taking: Reported on 10/10/2023)     Cholecalciferol (VITAMIN D-3) 125 MCG (5000 UT) TABS Take by mouth. (Patient not taking: Reported on 10/10/2023)     dexamethasone (DECADRON) 4 MG tablet Take 2 tablets (8mg ) by mouth daily starting the day after carboplatin for 3 days. Take with food 30 tablet 1    Multiple  Vitamin (MULTIVITAMIN) tablet Take 1 tablet by mouth daily. (Patient not taking: Reported on 10/10/2023)     ondansetron (ZOFRAN) 8 MG tablet Take 1 tablet (8 mg total) by mouth every 8 (eight) hours as needed for nausea or vomiting. Start on the third day after carboplatin. 30 tablet 1   prochlorperazine (COMPAZINE) 10 MG tablet Take 1 tablet (10 mg total) by mouth every 6 (six) hours as needed for nausea or vomiting. 30 tablet 1   No current facility-administered medications for this visit.   Facility-Administered Medications Ordered in Other Visits  Medication Dose Route Frequency Provider Last Rate Last Admin   0.9 %  sodium chloride  infusion   Intravenous Continuous Timmy Forbes, MD 10 mL/hr at 10/10/23 0942 New Bag at 10/10/23 0942   CARBOplatin (PARAPLATIN) 420 mg in sodium chloride  0.9 % 250 mL chemo infusion  420 mg Intravenous Once Rozalyn Osland, MD       PACLitaxel (TAXOL) 312 mg in sodium chloride  0.9 % 500 mL chemo infusion (> 80mg /m2)  175 mg/m2 (Treatment Plan Recorded) Intravenous Once Timmy Forbes, MD 184 mL/hr at 10/10/23 1106 312 mg at 10/10/23 1106    Review of Systems  Constitutional:  Negative for appetite change, chills, fatigue and fever.  HENT:   Negative for hearing loss and voice change.   Eyes:  Negative for eye problems.  Respiratory:  Negative for chest tightness and cough.   Cardiovascular:  Negative for chest pain.  Gastrointestinal:  Positive for abdominal pain. Negative for abdominal distention and blood in stool.  Endocrine: Negative for hot flashes.  Genitourinary:  Negative for difficulty urinating and frequency.   Musculoskeletal:  Negative for arthralgias.  Skin:  Negative for itching and rash.  Neurological:  Negative for extremity weakness.  Hematological:  Negative for adenopathy.  Psychiatric/Behavioral:  Negative for confusion.    PHYSICAL EXAMINATION: ECOG PERFORMANCE STATUS: 0 - Asymptomatic Vitals:   10/10/23 0837  BP: 132/69  Pulse: 74   Resp: 18  Temp: 98.1 F (36.7 C)  SpO2: 98%   Filed Weights   10/10/23 0837  Weight: 159 lb 1.6 oz (72.2 kg)    Physical Exam Constitutional:      General: She is not in acute distress. HENT:     Head: Normocephalic and atraumatic.   Eyes:     General: No scleral icterus.   Cardiovascular:     Rate and Rhythm: Normal rate and regular rhythm.     Heart sounds: Normal heart sounds.  Pulmonary:     Effort: Pulmonary effort is normal. No respiratory distress.     Breath sounds: No wheezing.  Abdominal:     General: Bowel sounds are normal. There is no distension.     Palpations: Abdomen is soft.   Musculoskeletal:        General: No deformity. Normal range of motion.     Cervical back: Normal range of motion and neck supple.   Skin:    General: Skin is warm and dry.     Findings: No erythema or rash.   Neurological:     Mental Status: She is alert and oriented to person, place, and time. Mental status is at baseline.   Psychiatric:        Mood and Affect: Mood normal.     LABORATORY DATA:  I have reviewed the data as listed    Latest Ref Rng & Units 10/10/2023    8:05 AM 09/26/2023   10:41 AM 09/18/2023    4:09  PM  CBC  WBC 4.0 - 10.5 K/uL 7.7  8.7  9.4   Hemoglobin 12.0 - 15.0 g/dL 8.5  9.4  9.1   Hematocrit 36.0 - 46.0 % 27.1  29.2  28.5   Platelets 150 - 400 K/uL 470  637  399       Latest Ref Rng & Units 10/10/2023    8:04 AM 09/16/2023   10:09 AM 08/17/2022    8:28 AM  CMP  Glucose 70 - 99 mg/dL 409  811    BUN 8 - 23 mg/dL 27  27    Creatinine 9.14 - 1.00 mg/dL 7.82  9.56    Sodium 213 - 145 mmol/L 140  137    Potassium 3.5 - 5.1 mmol/L 4.7  4.3    Chloride 98 - 111 mmol/L 105  104    CO2 22 - 32 mmol/L 23  22    Calcium 8.9 - 10.3 mg/dL 8.8  9.2    Total Protein 6.5 - 8.1 g/dL 6.6  6.7  6.4   Total Bilirubin 0.0 - 1.2 mg/dL 0.5  0.6  0.4   Alkaline Phos 38 - 126 U/L 90  66  74   AST 15 - 41 U/L 21  38  19   ALT 0 - 44 U/L 13  19  12         RADIOGRAPHIC STUDIES: I have personally reviewed the radiological images as listed and agreed with the findings in the report. CT Chest W Contrast Result Date: 10/09/2023 CLINICAL DATA:  Peritoneal carcinomatosis. Staging. * Tracking Code: BO * EXAM: CT CHEST WITH CONTRAST TECHNIQUE: Multidetector CT imaging of the chest was performed during intravenous contrast administration. RADIATION DOSE REDUCTION: This exam was performed according to the departmental dose-optimization program which includes automated exposure control, adjustment of the mA and/or kV according to patient size and/or use of iterative reconstruction technique. CONTRAST:  60mL OMNIPAQUE  IOHEXOL  300 MG/ML  SOLN COMPARISON:  Abdomen pelvis CT 09/16/2023. FINDINGS: Cardiovascular: Heart is nonenlarged. Trace pericardial fluid or thickening. The thoracic aorta is normal course and caliber. Mild atherosclerotic changes. Minimal coronary calcifications are seen. Mediastinum/Nodes: Preserved thyroid  gland. The thoracic esophagus has a normal course and caliber. No specific abnormal lymph node enlargement identified in the axillary region or hila. There are a few left internal mammary chain nodes identified such as image 50 and 63. The larger focus on image 63 measures 6 x 11 mm, abnormal for a node in this location. Small pre cardiophrenic nodes identified such as image 95 on the right side and image 102. Lesion on image 102 measures 15 x 7 mm. Abnormal for node in this location. Small subcarinal and paraesophageal nodes identified, less than a cm in short axis. Lungs/Pleura: There is some linear opacity lung bases likely scar or atelectasis. No consolidation, pneumothorax. Trace left-sided pleural fluid. There are several tiny lung nodules identified. Example right lower lobe measures 4 mm on series 4, image 76. Example right lower lobe image 65 measures 4 mm. Slightly larger focus superior segment right lower lobe on image 51 of series 4  measuring 5 mm. There are a few on the left side as well. Gallbladder calcified but the vast majority are noncalcified. Up to 20 nodules are seen in the right lung and and 15 on the left. Although these are small, with the abdominal findings are worrisome for early lung metastases. Upper Abdomen: Ascites identified liver and splenic masses. Gallstones. Please see recent abdomen and pelvis CT  scan. Musculoskeletal: Scattered degenerative changes along the spine. Partial fusion of the vertebral bodies at T3 and T4 which trace listhesis of T3 on T4. Findings are likely congenital. IMPRESSION: Several scattered sub 5 mm bilateral lung nodules identified. Based on appearance and other findings these are worrisome for early lung metastases. Few enlarged pre cardiophrenic mediastinal nodes as well as left internal mammary chain lymph nodes also worrisome for spread of disease. Tiny left pleural effusion. Please correlate with previous abdomen pelvis CT describing abdominal findings. Aortic Atherosclerosis (ICD10-I70.0). Electronically Signed   By: Adrianna Horde M.D.   On: 10/09/2023 14:23   CT ABDOMINAL MASS BIOPSY Result Date: 09/26/2023 INDICATION: 73 year old female with peritoneal carcinomatosis, presents for biopsy EXAM: CT BIOPSY MEDICATIONS: None. ANESTHESIA/SEDATION: Moderate (conscious) sedation was employed during this procedure. A total of Versed  1.0 mg and Fentanyl  50 mcg was administered intravenously. Moderate Sedation Time: 20 minutes. The patient's level of consciousness and vital signs were monitored continuously by radiology nursing throughout the procedure under my direct supervision. FLUOROSCOPY TIME:  CT COMPLICATIONS: None PROCEDURE: Informed written consent was obtained from the patient after a thorough discussion of the procedural risks, benefits and alternatives. All questions were addressed. Maximal Sterile Barrier Technique was utilized including caps, mask, sterile gowns, sterile gloves,  sterile drape, hand hygiene and skin antiseptic. A timeout was performed prior to the initiation of the procedure. Patient positioned supine on the CT gantry table. Scout CT acquired for planning purposes. The patient is prepped and draped in the usual sterile fashion. 1% lidocaine  was used for local anesthesia. Using CT guidance, introducer needle was advanced into the targeted mass in the left upper quadrant from an anterior approach. Once we confirmed needle tip position multiple 18 gauge core biopsy were acquired. Specimen placed into formalin. Needle was removed and a final CT was performed. Patient tolerated the procedure well and remained hemodynamically stable throughout. No complications were encountered and no significant blood loss. IMPRESSION: Status post CT-guided biopsy of left upper quadrant peritoneal mass. Signed, Marciano Settles. Rexine Cater, RPVI [facility] Electronically Signed   By: Myrlene Asper D.O.   On: 09/26/2023 12:11   US  Paracentesis Result Date: 09/24/2023 INDICATION: 73 year old female. History of peritoneal carcinomatosis with recurrent ascites. Request is for therapeutic and diagnostic paracentesis EXAM: ULTRASOUND GUIDED THERAPEUTIC AND DIAGNOSTIC LEFT-SIDED PARACENTESIS MEDICATIONS: lidocaine  1% 10 mL COMPLICATIONS: None immediate. PROCEDURE: Informed written consent was obtained from the patient after a discussion of the risks, benefits and alternatives to treatment. A timeout was performed prior to the initiation of the procedure. Initial ultrasound scanning demonstrates a small amount of ascites within the left lower abdominal quadrant. The left lower abdomen was prepped and draped in the usual sterile fashion. 1% lidocaine  was used for local anesthesia. Following this, a 19 gauge, 7-cm, Yueh catheter was introduced. An ultrasound image was saved for documentation purposes. The paracentesis was performed. The catheter  was removed and a dressing was applied. The patient tolerated the procedure well without immediate post procedural complication. FINDINGS: A total of approximately 2.5 L of straw-colored fluid was removed. Samples were sent to the laboratory as requested by the clinical team. IMPRESSION: Successful ultrasound-guided therapeutic and diagnostic left-sided paracentesis yielding 2 point liters of peritoneal fluid. Performed by Reagan Camera NP Electronically Signed   By: Fernando Hoyer M.D.   On: 09/24/2023 12:44   CT ABDOMEN PELVIS W CONTRAST Result Date: 09/16/2023 CLINICAL DATA:  Abdominal pain, acute, nonlocalized. * Tracking Code:  BO * EXAM: CT ABDOMEN AND PELVIS WITH CONTRAST TECHNIQUE: Multidetector CT imaging of the abdomen and pelvis was performed using the standard protocol following bolus administration of intravenous contrast. RADIATION DOSE REDUCTION: This exam was performed according to the departmental dose-optimization program which includes automated exposure control, adjustment of the mA and/or kV according to patient size and/or use of iterative reconstruction technique. CONTRAST:  OMNIPAQUE  IOHEXOL  300 MG/ML  SOLN COMPARISON:  None Available. FINDINGS: Lower chest: There are at least 3, sub 4 mm, solid noncalcified nodules in the visualized bilateral lungs with largest measuring up to 3.4 x 4.0 mm in the right lung lower lobe (series 4, image 1), partially seen. Please see follow-up recommendations below. There are patchy atelectatic changes in the visualized lung bases. No overt consolidation. No pleural effusion. The heart is normal in size. No pericardial effusion. Hepatobiliary: The liver is normal in size. Non-cirrhotic configuration. There are at least 3, hypoattenuating masses in the right hepatic lobe with largest in the segment 6, subcapsular location measuring 3.5 x 4.5 cm, compatible with metastases. There also ill-defined is similar characteristic hypoattenuating areas  surrounding the liver along the region of caudate lobe, highly concerning for serosal implants. There is an additional 2.1 x 2.2 cm hypoattenuating lesion in the left hepatic lobe, segments 2/4 a, which may represent a cyst. No intrahepatic or extrahepatic bile duct dilation. Small volume dependent calcified gallstones noted without imaging signs of acute cholecystitis. There is focal wall thickening near the fundus of the gallbladder, most likely secondary to underlying adenomyomatosis. Normal gallbladder wall thickness. No pericholecystic inflammatory changes. Pancreas: Small/atrophic pancreas. No discrete suspicious mass noted. Main pancreatic duct is not dilated. No peripancreatic fat stranding. Spleen: Normal size spleen. There are multiple hypoattenuating masses with largest along the lateral subcapsular area measuring 2.0 x 3.2 cm, highly concerning for metastases. Adrenals/Urinary Tract: Adrenal glands are unremarkable. Bilateral kidneys are small/atrophic and exhibits persistent fetal lobulations. No hydroureteronephrosis or nephroureterolithiasis. Unremarkable urinary bladder. Stomach/Bowel: No disproportionate dilation of the small or large bowel loops. No evidence of abnormal bowel wall thickening or inflammatory changes. The appendix was not distinctly separately visualized; however there is no acute inflammatory process in the right lower quadrant. Vascular/Lymphatic: There is small to moderate ascites. There are extensive centrally necrotic omental implants as well as multiple irregular hyperattenuating nodules along the peritoneal reflections, compatible with extensive peritoneal carcinomatosis. There multiple mildly enlarged retroperitoneal lymph nodes (for example, series 2, images 36, 41, 48, 55, etc.), with largest lymph node measuring up to 1.1 x 1.8 cm, also highly concerning for metastases. 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 Reproductive: Limited evaluation on CT scan exam.  Normal-size anteverted uterus noted. 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 There is mixed solid/cystic appearance of bilateral ovaries, which may be due to drop metastasis along the surface. However, correlate clinically and with tumor markers to determine the need for additional imaging with contrast-enhanced MRI pelvis. Other: There are tumor deposit along the undersurface of the bilateral hemidiaphragm, right more than left. There are small fat containing umbilical and right inguinal hernias. The soft tissues and abdominal wall are otherwise unremarkable. Musculoskeletal: No suspicious osseous lesions. There are mild multilevel degenerative changes in the visualized spine. IMPRESSION: 1. There are findings compatible with extensive omental and peritoneal carcinomatosis with small to moderate ascites. There are multiple hypoattenuating masses in the liver and spleen, compatible with metastases. There are also multiple mildly enlarged retroperitoneal lymph nodes,  also highly concerning for metastases. 2. There is mixed solid/cystic appearance of bilateral ovaries, which may be due to drop metastasis along the surface. However, correlate clinically and with tumor markers to determine the need for additional imaging with contrast-enhanced MRI pelvis. 3. There are at least 3, sub 4 mm, solid noncalcified nodules in the visualized bilateral lungs with largest measuring up to 3.4 x 4.0 mm in the right lung lower lobe, partially seen. In the given clinical context, these are also highly concerning for metastases. Consider further evaluation with dedicated chest CT and establish a baseline. 4. Multiple other nonacute observations, as described above. Electronically Signed   By: Beula Brunswick M.D.   On: 09/16/2023 12:10

## 2023-10-10 NOTE — Assessment & Plan Note (Signed)
 Anticipate hemoglobin will be lower after chemotherapy.  She is symptomatic Recommend 1 unit of PRBC transfusion.

## 2023-10-10 NOTE — Assessment & Plan Note (Addendum)
 Image findings and pathology results were reviewed and discussed with patient. 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  Normal CEA.  Elevated CA125 609, elevated CA 27.29 -415 CT chest official reading is pending. Recommended neoadjuvant chemotherapy with carboplatin/Taxol +/- Bevacizumab for 3-6 cycles followed by reimaging, followed by possible debulking surgery. Check HRD - pending Case was discussed on gynonc tumor board. Consnsus reached upon carboplatin and Taxol as neoadjuvant chemotherapy hold off Bevacizumab due to concern of tumor abutting/close to bowels.   Labs are reviewed and discussed with patient. Proceed with cycle 1 Carboplatin AUC 6 with Paclitaxel 175mg /m2  Discussed about antiemetics and steroid instructions.

## 2023-10-11 ENCOUNTER — Inpatient Hospital Stay

## 2023-10-11 DIAGNOSIS — D649 Anemia, unspecified: Secondary | ICD-10-CM

## 2023-10-11 DIAGNOSIS — C563 Malignant neoplasm of bilateral ovaries: Secondary | ICD-10-CM

## 2023-10-11 DIAGNOSIS — Z5111 Encounter for antineoplastic chemotherapy: Secondary | ICD-10-CM | POA: Diagnosis not present

## 2023-10-11 MED ORDER — DIPHENHYDRAMINE HCL 25 MG PO CAPS
25.0000 mg | ORAL_CAPSULE | Freq: Once | ORAL | Status: AC
  Administered 2023-10-11: 25 mg via ORAL
  Filled 2023-10-11: qty 1

## 2023-10-11 MED ORDER — SODIUM CHLORIDE 0.9% IV SOLUTION
250.0000 mL | INTRAVENOUS | Status: DC
  Administered 2023-10-11: 100 mL via INTRAVENOUS
  Filled 2023-10-11: qty 250

## 2023-10-11 MED ORDER — ACETAMINOPHEN 325 MG PO TABS
650.0000 mg | ORAL_TABLET | Freq: Once | ORAL | Status: AC
  Administered 2023-10-11: 650 mg via ORAL
  Filled 2023-10-11: qty 2

## 2023-10-12 LAB — TYPE AND SCREEN
ABO/RH(D): O POS
Antibody Screen: NEGATIVE
Unit division: 0

## 2023-10-12 LAB — BPAM RBC
Blood Product Expiration Date: 202507042359
ISSUE DATE / TIME: 202506130918
Unit Type and Rh: 5100

## 2023-10-14 ENCOUNTER — Other Ambulatory Visit: Payer: Self-pay

## 2023-10-14 ENCOUNTER — Encounter: Payer: Self-pay | Admitting: Hospice and Palliative Medicine

## 2023-10-14 ENCOUNTER — Telehealth: Payer: Self-pay

## 2023-10-14 ENCOUNTER — Encounter: Payer: Self-pay | Admitting: Oncology

## 2023-10-14 ENCOUNTER — Inpatient Hospital Stay

## 2023-10-14 ENCOUNTER — Telehealth: Payer: Self-pay | Admitting: *Deleted

## 2023-10-14 ENCOUNTER — Inpatient Hospital Stay (HOSPITAL_BASED_OUTPATIENT_CLINIC_OR_DEPARTMENT_OTHER): Admitting: Hospice and Palliative Medicine

## 2023-10-14 VITALS — BP 149/84 | HR 71 | Temp 97.8°F | Resp 17 | Wt 157.0 lb

## 2023-10-14 DIAGNOSIS — D649 Anemia, unspecified: Secondary | ICD-10-CM

## 2023-10-14 DIAGNOSIS — C563 Malignant neoplasm of bilateral ovaries: Secondary | ICD-10-CM

## 2023-10-14 DIAGNOSIS — Z5111 Encounter for antineoplastic chemotherapy: Secondary | ICD-10-CM | POA: Diagnosis not present

## 2023-10-14 LAB — CMP (CANCER CENTER ONLY)
ALT: 22 U/L (ref 0–44)
AST: 30 U/L (ref 15–41)
Albumin: 3 g/dL — ABNORMAL LOW (ref 3.5–5.0)
Alkaline Phosphatase: 94 U/L (ref 38–126)
Anion gap: 9 (ref 5–15)
BUN: 39 mg/dL — ABNORMAL HIGH (ref 8–23)
CO2: 24 mmol/L (ref 22–32)
Calcium: 8.7 mg/dL — ABNORMAL LOW (ref 8.9–10.3)
Chloride: 103 mmol/L (ref 98–111)
Creatinine: 1.2 mg/dL — ABNORMAL HIGH (ref 0.44–1.00)
GFR, Estimated: 48 mL/min — ABNORMAL LOW (ref >60)
Glucose, Bld: 99 mg/dL (ref 70–99)
Potassium: 4.9 mmol/L (ref 3.5–5.1)
Sodium: 136 mmol/L (ref 135–145)
Total Bilirubin: 0.5 mg/dL (ref 0.0–1.2)
Total Protein: 6.7 g/dL (ref 6.5–8.1)

## 2023-10-14 LAB — CBC (CANCER CENTER ONLY)
HCT: 33 % — ABNORMAL LOW (ref 36.0–46.0)
Hemoglobin: 10.7 g/dL — ABNORMAL LOW (ref 12.0–15.0)
MCH: 27.5 pg (ref 26.0–34.0)
MCHC: 32.4 g/dL (ref 30.0–36.0)
MCV: 84.8 fL (ref 80.0–100.0)
Platelet Count: 498 10*3/uL — ABNORMAL HIGH (ref 150–400)
RBC: 3.89 MIL/uL (ref 3.87–5.11)
RDW: 15.7 % — ABNORMAL HIGH (ref 11.5–15.5)
WBC Count: 7 10*3/uL (ref 4.0–10.5)
nRBC: 0 % (ref 0.0–0.2)

## 2023-10-14 MED ORDER — DEXAMETHASONE SODIUM PHOSPHATE 10 MG/ML IJ SOLN
6.0000 mg | Freq: Once | INTRAMUSCULAR | Status: AC
  Administered 2023-10-14: 6 mg via INTRAVENOUS
  Filled 2023-10-14: qty 1

## 2023-10-14 MED ORDER — SODIUM CHLORIDE 0.9 % IV SOLN
Freq: Once | INTRAVENOUS | Status: AC
  Filled 2023-10-14: qty 250

## 2023-10-14 MED ORDER — SODIUM CHLORIDE 0.9 % IV SOLN
10.0000 mg | Freq: Once | INTRAVENOUS | Status: AC
  Administered 2023-10-14: 10 mg via INTRAVENOUS
  Filled 2023-10-14: qty 1

## 2023-10-14 MED ORDER — SENNA 8.6 MG PO TABS
1.0000 | ORAL_TABLET | Freq: Every day | ORAL | 0 refills | Status: DC | PRN
  Filled 2023-10-14: qty 120, 120d supply, fill #0

## 2023-10-14 MED ORDER — OXYCODONE HCL 5 MG PO TABS
5.0000 mg | ORAL_TABLET | Freq: Three times a day (TID) | ORAL | 0 refills | Status: AC | PRN
  Filled 2023-10-14: qty 30, 10d supply, fill #0

## 2023-10-14 MED ORDER — OMEPRAZOLE 20 MG PO CPDR
20.0000 mg | DELAYED_RELEASE_CAPSULE | Freq: Every day | ORAL | 0 refills | Status: DC
  Filled 2023-10-14: qty 30, 30d supply, fill #0

## 2023-10-14 NOTE — Progress Notes (Signed)
 Symptom Management Clinic [facility] at [facility] [phone] [phone]  Patient Care Team: Bair, Kalpana, MD as PCP - General (Family Medicine) Rochell Chroman, RN as Oncology Nurse Navigator Timmy Forbes, MD as Consulting Physician (Oncology)   NAME OF PATIENT: Beth Hart  [id number]  02/26/1951   DATE OF VISIT: 10/14/23  REASON FOR CONSULT: Beth Hart is a 73 y.o. 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   INTERVAL HISTORY: Patient saw Dr. Wilhelmenia Harada on 10/10/2023 and received cycle 1 neoadjuvant CarboTaxol chemotherapy.  She presents to [facility] today with complaint of leg pain, poor oral intake, and weakness.  Patient states she has felt poorly since receiving chemotherapy.  She has had poor oral intake and has felt weak.  She feels like she could be dehydrated.  Reports dry mouth.  Has had occasional nausea but no vomiting.  Reports feeling heartburn and full.  Endorses 3 days of constipation.  Has taken MiraLAX x 2 with no improvement.  Patient also endorses numbness and tingling to the feet.  Also has bilateral knee pain, which is keeping her awake at night.  She has tried acetaminophen with little improvement.  Denies recent fevers or illnesses. Denies any easy bleeding or bruising. Denies chest pain. Denies urinary complaints. Patient offers no further specific complaints today.   PAST MEDICAL HISTORY: Past Medical History:  Diagnosis Date   Anemia    Arthritis    Asthma    as a child   Chicken pox    Colon polyps 2012   Depression    Hemorrhoids     PAST SURGICAL HISTORY:  Past Surgical History:  Procedure Laterality Date   BREAST CYST ASPIRATION Left 90s   BREAST SURGERY  2007   aspiration   CARPAL TUNNEL RELEASE  2010   REFRACTIVE SURGERY     TONSILLECTOMY      HEMATOLOGY/ONCOLOGY HISTORY:  Oncology  History  Peritoneal carcinomatosis (HCC)  09/18/2023 Initial Diagnosis   Peritoneal carcinomatosis (HCC)   10/10/2023 -  Chemotherapy   Patient is on Treatment Plan : OVARIAN Carboplatin  (AUC 6) + Paclitaxel  (175) q21d X 6 Cycles     Ovarian cancer (HCC)  09/25/2023 Cancer Staging   Staging form: Ovary, Fallopian Tube, and Primary Peritoneal Carcinoma, AJCC 8th Edition - Clinical stage from 09/25/2023: FIGO Stage IV (cT3c, cN1b, cM1) - Signed by Timmy Forbes, MD on 10/05/2023 Stage prefix: Initial diagnosis   10/04/2023 Initial Diagnosis   Ovarian cancer  She experiences pressure and sharp pain in her abdomen, initially attributing it to a pulled muscle from yard work. The pain persisted and was accompanied by a fever, prompting her to visit the ER on 09/16/2023. The fever was around 100F at home and remained at that level upon arrival at the ER. She has a persistent cough, especially when lying down.   09/16/2023, CT abdomen pelvis with contrast showed extensive omental and peritoneal carcinomatosis with small to moderate ascites. multiple hypoattenuating masses in the liver and spleen, compatible with metastases. There are also multiple mildly enlarged retroperitoneal lymph nodes, also highly concerning for metastases.  09/24/2023, cytology from peritoneal ascites showed negative for malignancy. 09/25/2023, endometrial biopsy showed no malignancy. 09/26/2023 Patient underwent peritoneal mass biopsy,  1. Peritoneum, biopsy, RUQ mass :       - METASTATIC HIGH-GRADE SEROUS CARCINOMA WITH NECROSIS, SEE NOTE   Diagnosis Note : Immunohistochemical stains show that the tumor  cells are positive for PAX8, p16 and p53 (clonal overexpression pattern) while they are negative for p63 and CK5/6.  This immunoprofile is consistent with above interpretation.       10/10/2023 -  Chemotherapy   Patient is on Treatment Plan : OVARIAN Carboplatin  (AUC 6) + Paclitaxel  (175) q21d X 6 Cycles      Imaging   CT chest w  contrast  Several scattered sub 5 mm bilateral lung nodules identified. Based on appearance and other findings these are worrisome for early lung metastases. Few enlarged pre cardiophrenic mediastinal nodes as well as left internal mammary chain lymph nodes also worrisome for spread of disease.Tiny left pleural effusion.   Please correlate with previous abdomen pelvis CT describing abdominal findings.       ALLERGIES:  has no known allergies.  MEDICATIONS:  Current Outpatient Medications  Medication Sig Dispense Refill   Calcium Carb-Cholecalciferol (CALCIUM 1000 + D PO) Take by mouth. (Patient not taking: Reported on 10/10/2023)     Cholecalciferol (VITAMIN D-3) 125 MCG (5000 UT) TABS Take by mouth. (Patient not taking: Reported on 10/10/2023)     citalopram  (CELEXA ) 20 MG tablet Take 1 tablet (20 mg total) by mouth daily. 45 tablet 0   dexamethasone  (DECADRON ) 4 MG tablet Take 2 tablets (8mg ) by mouth daily starting the day after carboplatin  for 3 days. Take with food 30 tablet 1   Multiple Vitamin (MULTIVITAMIN) tablet Take 1 tablet by mouth daily. (Patient not taking: Reported on 10/10/2023)     ondansetron  (ZOFRAN ) 8 MG tablet Take 1 tablet (8 mg total) by mouth every 8 (eight) hours as needed for nausea or vomiting. Start on the third day after carboplatin . 30 tablet 1   prochlorperazine  (COMPAZINE ) 10 MG tablet Take 1 tablet (10 mg total) by mouth every 6 (six) hours as needed for nausea or vomiting. 30 tablet 1   No current facility-administered medications for this visit.    VITAL SIGNS: There were no vitals taken for this visit. There were no vitals filed for this visit.  Estimated body mass index is 29.1 kg/m as calculated from the following:   Height as of 10/04/23: 5' 2 (1.575 m).   Weight as of 10/10/23: 159 lb 1.6 oz (72.2 kg).  LABS: CBC:    Component Value Date/Time   WBC 7.7 10/10/2023 0805   WBC 8.7 09/26/2023 1041   HGB 8.5 (L) 10/10/2023 0805   HCT 27.1 (L)  10/10/2023 0805   PLT 470 (H) 10/10/2023 0805   MCV 85.8 10/10/2023 0805   NEUTROABS 5.4 10/10/2023 0805   NEUTROABS 3,830 06/12/2018 0000   LYMPHSABS 1.5 10/10/2023 0805   MONOABS 0.6 10/10/2023 0805   EOSABS 0.2 10/10/2023 0805   BASOSABS 0.1 10/10/2023 0805   Comprehensive Metabolic Panel:    Component Value Date/Time   NA 140 10/10/2023 0804   NA 140 12/18/2018 0000   K 4.7 10/10/2023 0804   CL 105 10/10/2023 0804   CO2 23 10/10/2023 0804   BUN 27 (H) 10/10/2023 0804   BUN 20 12/18/2018 0000   CREATININE 1.29 (H) 10/10/2023 0804   GLUCOSE 110 (H) 10/10/2023 0804   CALCIUM 8.8 (L) 10/10/2023 0804   AST 21 10/10/2023 0804   ALT 13 10/10/2023 0804   ALKPHOS 90 10/10/2023 0804   BILITOT 0.5 10/10/2023 0804   PROT 6.6 10/10/2023 0804   ALBUMIN 2.8 (L) 10/10/2023 0804    RADIOGRAPHIC STUDIES: CT Chest W Contrast Result Date: 10/09/2023 CLINICAL DATA:  Peritoneal  carcinomatosis. Staging. * Tracking Code: BO * EXAM: CT CHEST WITH CONTRAST TECHNIQUE: Multidetector CT imaging of the chest was performed during intravenous contrast administration. RADIATION DOSE REDUCTION: This exam was performed according to the departmental dose-optimization program which includes automated exposure control, adjustment of the mA and/or kV according to patient size and/or use of iterative reconstruction technique. CONTRAST:  60mL OMNIPAQUE  IOHEXOL  300 MG/ML  SOLN COMPARISON:  Abdomen pelvis CT 09/16/2023. FINDINGS: Cardiovascular: Heart is nonenlarged. Trace pericardial fluid or thickening. The thoracic aorta is normal course and caliber. Mild atherosclerotic changes. Minimal coronary calcifications are seen. Mediastinum/Nodes: Preserved thyroid  gland. The thoracic esophagus has a normal course and caliber. No specific abnormal lymph node enlargement identified in the axillary region or hila. There are a few left internal mammary chain nodes identified such as image 50 and 63. The larger focus on image 63  measures 6 x 11 mm, abnormal for a node in this location. Small pre cardiophrenic nodes identified such as image 95 on the right side and image 102. Lesion on image 102 measures 15 x 7 mm. Abnormal for node in this location. Small subcarinal and paraesophageal nodes identified, less than a cm in short axis. Lungs/Pleura: There is some linear opacity lung bases likely scar or atelectasis. No consolidation, pneumothorax. Trace left-sided pleural fluid. There are several tiny lung nodules identified. Example right lower lobe measures 4 mm on series 4, image 76. Example right lower lobe image 65 measures 4 mm. Slightly larger focus superior segment right lower lobe on image 51 of series 4 measuring 5 mm. There are a few on the left side as well. Gallbladder calcified but the vast majority are noncalcified. Up to 20 nodules are seen in the right lung and and 15 on the left. Although these are small, with the abdominal findings are worrisome for early lung metastases. Upper Abdomen: Ascites identified liver and splenic masses. Gallstones. Please see recent abdomen and pelvis CT scan. Musculoskeletal: Scattered degenerative changes along the spine. Partial fusion of the vertebral bodies at T3 and T4 which trace listhesis of T3 on T4. Findings are likely congenital. IMPRESSION: Several scattered sub 5 mm bilateral lung nodules identified. Based on appearance and other findings these are worrisome for early lung metastases. Few enlarged pre cardiophrenic mediastinal nodes as well as left internal mammary chain lymph nodes also worrisome for spread of disease. Tiny left pleural effusion. Please correlate with previous abdomen pelvis CT describing abdominal findings. Aortic Atherosclerosis (ICD10-I70.0). Electronically Signed   By: Adrianna Horde M.D.   On: 10/09/2023 14:23   CT ABDOMINAL MASS BIOPSY Result Date: 09/26/2023 INDICATION: 73 year old female with peritoneal carcinomatosis, presents for biopsy EXAM: CT BIOPSY  MEDICATIONS: None. ANESTHESIA/SEDATION: Moderate (conscious) sedation was employed during this procedure. A total of Versed  1.0 mg and Fentanyl  50 mcg was administered intravenously. Moderate Sedation Time: 20 minutes. The patient's level of consciousness and vital signs were monitored continuously by radiology nursing throughout the procedure under my direct supervision. FLUOROSCOPY TIME:  CT COMPLICATIONS: None PROCEDURE: Informed written consent was obtained from the patient after a thorough discussion of the procedural risks, benefits and alternatives. All questions were addressed. Maximal Sterile Barrier Technique was utilized including caps, mask, sterile gowns, sterile gloves, sterile drape, hand hygiene and skin antiseptic. A timeout was performed prior to the initiation of the procedure. Patient positioned supine on the CT gantry table. Scout CT acquired for planning purposes. The patient is prepped and draped in the usual sterile fashion. 1% lidocaine  was used for  local anesthesia. Using CT guidance, introducer needle was advanced into the targeted mass in the left upper quadrant from an anterior approach. Once we confirmed needle tip position multiple 18 gauge core biopsy were acquired. Specimen placed into formalin. Needle was removed and a final CT was performed. Patient tolerated the procedure well and remained hemodynamically stable throughout. No complications were encountered and no significant blood loss. IMPRESSION: Status post CT-guided biopsy of left upper quadrant peritoneal mass. Signed, Marciano Settles. Rexine Cater, RPVI [facility] Electronically Signed   By: Myrlene Asper D.O.   On: 09/26/2023 12:11   US  Paracentesis Result Date: 09/24/2023 INDICATION: 73 year old female. History of peritoneal carcinomatosis with recurrent ascites. Request is for therapeutic and diagnostic paracentesis EXAM: ULTRASOUND GUIDED THERAPEUTIC AND  DIAGNOSTIC LEFT-SIDED PARACENTESIS MEDICATIONS: lidocaine  1% 10 mL COMPLICATIONS: None immediate. PROCEDURE: Informed written consent was obtained from the patient after a discussion of the risks, benefits and alternatives to treatment. A timeout was performed prior to the initiation of the procedure. Initial ultrasound scanning demonstrates a small amount of ascites within the left lower abdominal quadrant. The left lower abdomen was prepped and draped in the usual sterile fashion. 1% lidocaine  was used for local anesthesia. Following this, a 19 gauge, 7-cm, Yueh catheter was introduced. An ultrasound image was saved for documentation purposes. The paracentesis was performed. The catheter was removed and a dressing was applied. The patient tolerated the procedure well without immediate post procedural complication. FINDINGS: A total of approximately 2.5 L of straw-colored fluid was removed. Samples were sent to the laboratory as requested by the clinical team. IMPRESSION: Successful ultrasound-guided therapeutic and diagnostic left-sided paracentesis yielding 2 point liters of peritoneal fluid. Performed by Reagan Camera NP Electronically Signed   By: Fernando Hoyer M.D.   On: 09/24/2023 12:44   CT ABDOMEN PELVIS W CONTRAST Result Date: 09/16/2023 CLINICAL DATA:  Abdominal pain, acute, nonlocalized. * Tracking Code: BO * EXAM: CT ABDOMEN AND PELVIS WITH CONTRAST TECHNIQUE: Multidetector CT imaging of the abdomen and pelvis was performed using the standard protocol following bolus administration of intravenous contrast. RADIATION DOSE REDUCTION: This exam was performed according to the departmental dose-optimization program which includes automated exposure control, adjustment of the mA and/or kV according to patient size and/or use of iterative reconstruction technique. CONTRAST:  OMNIPAQUE  IOHEXOL  300 MG/ML  SOLN COMPARISON:  None Available. FINDINGS: Lower chest: There are at least 3, sub 4 mm,  solid noncalcified nodules in the visualized bilateral lungs with largest measuring up to 3.4 x 4.0 mm in the right lung lower lobe (series 4, image 1), partially seen. Please see follow-up recommendations below. There are patchy atelectatic changes in the visualized lung bases. No overt consolidation. No pleural effusion. The heart is normal in size. No pericardial effusion. Hepatobiliary: The liver is normal in size. Non-cirrhotic configuration. There are at least 3, hypoattenuating masses in the right hepatic lobe with largest in the segment 6, subcapsular location measuring 3.5 x 4.5 cm, compatible with metastases. There also ill-defined is similar characteristic hypoattenuating areas surrounding the liver along the region of caudate lobe, highly concerning for serosal implants. There is an additional 2.1 x 2.2 cm hypoattenuating lesion in the left hepatic lobe, segments 2/4 a, which may represent a cyst. No intrahepatic or extrahepatic bile duct dilation. Small volume dependent calcified gallstones noted without imaging signs of acute cholecystitis. There is focal wall thickening near the fundus of the gallbladder, most likely secondary to underlying adenomyomatosis. Normal  gallbladder wall thickness. No pericholecystic inflammatory changes. Pancreas: Small/atrophic pancreas. No discrete suspicious mass noted. Main pancreatic duct is not dilated. No peripancreatic fat stranding. Spleen: Normal size spleen. There are multiple hypoattenuating masses with largest along the lateral subcapsular area measuring 2.0 x 3.2 cm, highly concerning for metastases. Adrenals/Urinary Tract: Adrenal glands are unremarkable. Bilateral kidneys are small/atrophic and exhibits persistent fetal lobulations. No hydroureteronephrosis or nephroureterolithiasis. Unremarkable urinary bladder. Stomach/Bowel: No disproportionate dilation of the small or large bowel loops. No evidence of abnormal bowel wall thickening or inflammatory  changes. The appendix was not distinctly separately visualized; however there is no acute inflammatory process in the right lower quadrant. Vascular/Lymphatic: There is small to moderate ascites. There are extensive centrally necrotic omental implants as well as multiple irregular hyperattenuating nodules along the peritoneal reflections, compatible with extensive peritoneal carcinomatosis. There multiple mildly enlarged retroperitoneal lymph nodes (for example, series 2, images 36, 41, 48, 55, etc.), with largest lymph node measuring up to 1.1 x 1.8 cm, also highly concerning for metastases. 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 Reproductive: Limited evaluation on CT scan exam. Normal-size anteverted uterus noted. 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 There is mixed solid/cystic appearance of bilateral ovaries, which may be due to drop metastasis along the surface. However, correlate clinically and with tumor markers to determine the need for additional imaging with contrast-enhanced MRI pelvis. Other: There are tumor deposit along the undersurface of the bilateral hemidiaphragm, right more than left. There are small fat containing umbilical and right inguinal hernias. The soft tissues and abdominal wall are otherwise unremarkable. Musculoskeletal: No suspicious osseous lesions. There are mild multilevel degenerative changes in the visualized spine. IMPRESSION: 1. There are findings compatible with extensive omental and peritoneal carcinomatosis with small to moderate ascites. There are multiple hypoattenuating masses in the liver and spleen, compatible with metastases. There are also multiple mildly enlarged retroperitoneal lymph nodes, also highly concerning for metastases. 2. There is mixed solid/cystic appearance of bilateral ovaries, which may be due to drop metastasis along the surface. However, correlate clinically and  with tumor markers to determine the need for additional imaging with contrast-enhanced MRI pelvis. 3. There are at least 3, sub 4 mm, solid noncalcified nodules in the visualized bilateral lungs with largest measuring up to 3.4 x 4.0 mm in the right lung lower lobe, partially seen. In the given clinical context, these are also highly concerning for metastases. Consider further evaluation with dedicated chest CT and establish a baseline. 4. Multiple other nonacute observations, as described above. Electronically Signed   By: Beula Brunswick M.D.   On: 09/16/2023 12:10    PERFORMANCE STATUS (ECOG) : 1 - Symptomatic but completely ambulatory  Review of Systems Unless otherwise noted, a complete review of systems is negative.  Physical Exam General: NAD Cardiovascular: regular rate and rhythm Pulmonary: clear ant fields Abdomen: soft, nontender, + bowel sounds GU: no suprapubic tenderness Extremities: no edema, no joint deformities Skin: no rashes Neurological: Weakness but otherwise nonfocal  IMPRESSION/PLAN: High-grade serous carcinoma of the ovary -status post cycle 1 CarboTaxol chemotherapy  Dehydration -proceed with IV fluids and supportive care.  Constipation -continue daily MiraLAX.  Add daily senna  GERD -we will give IV Pepcid  in clinic today.  Will start omeprazole 20 mg daily.  Neoplasm related pain -start oxycodone 5 mg every 8 hours as needed #30.  Daily bowel regimen as noted above to prevent opioid-induced constipation.  Poor oral intake -referral to  nutrition.  Recommend maximizing oral nutritional supplements.  Depression - on citalopram . Referral to [facility]/SW.   Goals of care -patient stated that she has contemplated foregoing further cancer treatment and just focusing on end-of-life care.  However, we discussed trying to maximize supportive care and get patient feeling better and then reevaluating her goals.  Case and plan discussed with Dr. Wilhelmenia Harada   Patient  expressed understanding and was in agreement with this plan. She also understands that She can call clinic at any time with any questions, concerns, or complaints.   Thank you for allowing me to participate in the care of this very pleasant patient.   Time Total: 25 minutes  Visit consisted of counseling and education dealing with the complex and emotionally intense issues of symptom management in the setting of serious illness.Greater than 50%  of this time was spent counseling and coordinating care related to the above assessment and plan.  Signed by: Gerilyn Kobus, PhD, NP-C

## 2023-10-14 NOTE — Telephone Encounter (Signed)
 Pt has appointment this week on 6/19. Dr. Wilhelmenia Harada recommends for her to see [facility] if she wants to be evaluated sooner.

## 2023-10-14 NOTE — Telephone Encounter (Signed)
 Patient states that since she got her first treatment Thursday that she is having a lot of leg pain that she tried to move the legs in different ways to see if anything would make it better but now she still having the leg pain.  She also has not really been eating and drinking very much she is trying to drink Sprite and this morning she had a piece of a hash brown. she states the feels wobbly.  She says she has ingestion and she has got constipation she has been trying MiraLAX twice over the weekend and she still has not got any stool out.  All the food does not taste good at all.

## 2023-10-14 NOTE — Progress Notes (Signed)
 Concerns of Neuropathy, burping tightness in chest, constipation and weakness

## 2023-10-14 NOTE — Telephone Encounter (Signed)
 Called patient, she said she can be here at 1p Dr Wilhelmenia Harada recommending [facility]

## 2023-10-15 ENCOUNTER — Other Ambulatory Visit: Payer: Self-pay | Admitting: *Deleted

## 2023-10-15 ENCOUNTER — Telehealth: Payer: Self-pay | Admitting: *Deleted

## 2023-10-15 ENCOUNTER — Inpatient Hospital Stay

## 2023-10-15 ENCOUNTER — Other Ambulatory Visit: Payer: Self-pay

## 2023-10-15 ENCOUNTER — Telehealth: Payer: Self-pay | Admitting: Oncology

## 2023-10-15 MED ORDER — LACTULOSE 10 GM/15ML PO SOLN
ORAL | 0 refills | Status: DC
  Filled 2023-10-15: qty 236, 2d supply, fill #0

## 2023-10-15 NOTE — Progress Notes (Signed)
 [facility] CSW Progress Note  Clinical Child psychotherapist contacted patient by phone to assess psychosocial needs per referral from [employer].  Provided active listening and supportive counseling while patient discussed her current situation regarding her cancer.  She is evaluating her qualify of life and has had several discussions with her family.  Her husband and children are very supportive.  Patient has experience with Hospice and is familiar with it's services.    Kennth Peal, LCSW Clinical Social Worker [facility]

## 2023-10-15 NOTE — Telephone Encounter (Signed)
 Pt spouse came up to clinic and stated that the pharmacy has not received any prescription orders.  (11:40AM)

## 2023-10-15 NOTE — Telephone Encounter (Signed)
 Patient said that she still constipated took 1 pill last night and 1 pill this morning at 4 AM and she still has not had a bowel movement.  She wanted to see if she can do fleets enema and Jerrilyn Moras says that is not a good to do when you are on chemotherapy.  He is going to send a prescription for lactulose and you can go from 15 to 30 mg and you can take it up to 3 times a day.

## 2023-10-15 NOTE — Telephone Encounter (Signed)
 Pt spouse came into clinic to check on prescription. Added note to the last telephone note about this.

## 2023-10-17 ENCOUNTER — Encounter: Payer: Self-pay | Admitting: Oncology

## 2023-10-17 ENCOUNTER — Inpatient Hospital Stay

## 2023-10-17 ENCOUNTER — Encounter

## 2023-10-17 ENCOUNTER — Inpatient Hospital Stay (HOSPITAL_BASED_OUTPATIENT_CLINIC_OR_DEPARTMENT_OTHER): Admitting: Hospice and Palliative Medicine

## 2023-10-17 ENCOUNTER — Inpatient Hospital Stay: Admitting: Oncology

## 2023-10-17 ENCOUNTER — Other Ambulatory Visit: Payer: Self-pay

## 2023-10-17 VITALS — BP 119/82 | HR 100 | Temp 98.2°F | Resp 16 | Wt 147.0 lb

## 2023-10-17 DIAGNOSIS — C563 Malignant neoplasm of bilateral ovaries: Secondary | ICD-10-CM

## 2023-10-17 DIAGNOSIS — Z5111 Encounter for antineoplastic chemotherapy: Secondary | ICD-10-CM | POA: Diagnosis not present

## 2023-10-17 DIAGNOSIS — C786 Secondary malignant neoplasm of retroperitoneum and peritoneum: Secondary | ICD-10-CM

## 2023-10-17 DIAGNOSIS — D509 Iron deficiency anemia, unspecified: Secondary | ICD-10-CM

## 2023-10-17 DIAGNOSIS — R634 Abnormal weight loss: Secondary | ICD-10-CM | POA: Insufficient documentation

## 2023-10-17 DIAGNOSIS — T451X5A Adverse effect of antineoplastic and immunosuppressive drugs, initial encounter: Secondary | ICD-10-CM

## 2023-10-17 DIAGNOSIS — G620 Drug-induced polyneuropathy: Secondary | ICD-10-CM | POA: Insufficient documentation

## 2023-10-17 DIAGNOSIS — F330 Major depressive disorder, recurrent, mild: Secondary | ICD-10-CM | POA: Diagnosis not present

## 2023-10-17 DIAGNOSIS — Z515 Encounter for palliative care: Secondary | ICD-10-CM | POA: Diagnosis not present

## 2023-10-17 DIAGNOSIS — N1831 Chronic kidney disease, stage 3a: Secondary | ICD-10-CM | POA: Diagnosis not present

## 2023-10-17 LAB — CMP (CANCER CENTER ONLY)
ALT: 23 U/L (ref 0–44)
AST: 26 U/L (ref 15–41)
Albumin: 3.4 g/dL — ABNORMAL LOW (ref 3.5–5.0)
Alkaline Phosphatase: 93 U/L (ref 38–126)
Anion gap: 11 (ref 5–15)
BUN: 27 mg/dL — ABNORMAL HIGH (ref 8–23)
CO2: 20 mmol/L — ABNORMAL LOW (ref 22–32)
Calcium: 9.4 mg/dL (ref 8.9–10.3)
Chloride: 103 mmol/L (ref 98–111)
Creatinine: 1.2 mg/dL — ABNORMAL HIGH (ref 0.44–1.00)
GFR, Estimated: 48 mL/min — ABNORMAL LOW (ref >60)
Glucose, Bld: 141 mg/dL — ABNORMAL HIGH (ref 70–99)
Potassium: 4.5 mmol/L (ref 3.5–5.1)
Sodium: 134 mmol/L — ABNORMAL LOW (ref 135–145)
Total Bilirubin: 0.7 mg/dL (ref 0.0–1.2)
Total Protein: 7.3 g/dL (ref 6.5–8.1)

## 2023-10-17 LAB — CBC (CANCER CENTER ONLY)
HCT: 32.5 % — ABNORMAL LOW (ref 36.0–46.0)
Hemoglobin: 10.5 g/dL — ABNORMAL LOW (ref 12.0–15.0)
MCH: 27.2 pg (ref 26.0–34.0)
MCHC: 32.3 g/dL (ref 30.0–36.0)
MCV: 84.2 fL (ref 80.0–100.0)
Platelet Count: 475 10*3/uL — ABNORMAL HIGH (ref 150–400)
RBC: 3.86 MIL/uL — ABNORMAL LOW (ref 3.87–5.11)
RDW: 15.6 % — ABNORMAL HIGH (ref 11.5–15.5)
WBC Count: 7.3 10*3/uL (ref 4.0–10.5)
nRBC: 0 % (ref 0.0–0.2)

## 2023-10-17 MED ORDER — NYSTATIN 100000 UNIT/ML MT SUSP
5.0000 mL | Freq: Four times a day (QID) | OROMUCOSAL | 0 refills | Status: DC
Start: 2023-10-17 — End: 2024-02-10
  Filled 2023-10-17: qty 60, 3d supply, fill #0

## 2023-10-17 MED ORDER — APIXABAN 2.5 MG PO TABS
2.5000 mg | ORAL_TABLET | Freq: Two times a day (BID) | ORAL | 0 refills | Status: DC
  Filled 2023-10-17: qty 60, 30d supply, fill #0

## 2023-10-17 MED ORDER — HEPARIN SOD (PORK) LOCK FLUSH 100 UNIT/ML IV SOLN
500.0000 [IU] | Freq: Once | INTRAVENOUS | Status: AC
  Filled 2023-10-17: qty 5

## 2023-10-17 MED ORDER — CITALOPRAM HYDROBROMIDE 40 MG PO TABS
40.0000 mg | ORAL_TABLET | Freq: Every day | ORAL | 3 refills | Status: DC
  Filled 2023-10-17 (×2): qty 30, 30d supply, fill #0
  Filled 2023-12-09: qty 30, 30d supply, fill #1
  Filled 2024-01-26: qty 30, 30d supply, fill #2
  Filled 2024-04-08: qty 30, 30d supply, fill #3

## 2023-10-17 NOTE — Progress Notes (Signed)
 Palliative Medicine [facility] at [facility] [phone] [phone]   Name: Beth Hart Date: 10/17/2023 MRN: [id number]  DOB: 03-10-1951  Patient Care Team: Bair, Kalpana, MD as PCP - General (Family Medicine) Rochell Chroman, RN as Oncology Nurse Navigator Timmy Forbes, MD as Consulting Physician (Oncology)    REASON FOR CONSULTATION: Beth Hart is a 73 y.o. 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  Patient is referred to palliative care to address goals and manage ongoing symptoms.  SOCIAL HISTORY:     reports that she has never smoked. She has never used smokeless tobacco. She reports that she does not drink alcohol and does not use drugs.  Patient is married and lives at home with her husband.  ADVANCE DIRECTIVES:    CODE STATUS:   PAST MEDICAL HISTORY: Past Medical History:  Diagnosis Date   Anemia    Arthritis    Asthma    as a child   Chicken pox    Colon polyps 2012   Depression    Hemorrhoids     PAST SURGICAL HISTORY:  Past Surgical History:  Procedure Laterality Date   BREAST CYST ASPIRATION Left 90s   BREAST SURGERY  2007   aspiration   CARPAL TUNNEL RELEASE  2010   REFRACTIVE SURGERY     TONSILLECTOMY      HEMATOLOGY/ONCOLOGY HISTORY:  Oncology History  Peritoneal carcinomatosis (HCC)  09/18/2023 Initial Diagnosis   Peritoneal carcinomatosis (HCC)   10/10/2023 -  Chemotherapy   Patient is on Treatment Plan : OVARIAN Carboplatin  (AUC 6) + Paclitaxel  (175) q21d X 6 Cycles     Ovarian cancer (HCC)  09/25/2023 Cancer Staging   Staging form: Ovary, Fallopian Tube, and Primary Peritoneal Carcinoma, AJCC 8th Edition - Clinical stage from 09/25/2023: FIGO Stage IV (cT3c, cN1b, cM1) - Signed by Timmy Forbes, MD on 10/05/2023 Stage prefix: Initial diagnosis   10/04/2023 Initial Diagnosis   Ovarian  cancer  She experiences pressure and sharp pain in her abdomen, initially attributing it to a pulled muscle from yard work. The pain persisted and was accompanied by a fever, prompting her to visit the ER on 09/16/2023. The fever was around 100F at home and remained at that level upon arrival at the ER. She has a persistent cough, especially when lying down.   09/16/2023, CT abdomen pelvis with contrast showed extensive omental and peritoneal carcinomatosis with small to moderate ascites. multiple hypoattenuating masses in the liver and spleen, compatible with metastases. There are also multiple mildly enlarged retroperitoneal lymph nodes, also highly concerning for metastases.  09/24/2023, cytology from peritoneal ascites showed negative for malignancy. 09/25/2023, endometrial biopsy showed no malignancy. 09/26/2023 Patient underwent peritoneal mass biopsy,  1. Peritoneum, biopsy, RUQ mass :       - METASTATIC HIGH-GRADE SEROUS CARCINOMA WITH NECROSIS, SEE NOTE   Diagnosis Note : Immunohistochemical stains show that the tumor cells are positive for PAX8, p16 and p53 (clonal overexpression pattern) while they are negative for p63 and CK5/6.  This immunoprofile is consistent with above interpretation.       10/10/2023 -  Chemotherapy   Patient is on Treatment Plan : OVARIAN Carboplatin  (AUC 6) + Paclitaxel  (175) q21d X 6 Cycles      Imaging   CT chest w contrast  Several scattered sub 5 mm bilateral lung nodules identified. Based on appearance and other findings these are worrisome  for early lung metastases. Few enlarged pre cardiophrenic mediastinal nodes as well as left internal mammary chain lymph nodes also worrisome for spread of disease.Tiny left pleural effusion.   Please correlate with previous abdomen pelvis CT describing abdominal findings.       ALLERGIES:  has no known allergies.  MEDICATIONS:  Current Outpatient Medications  Medication Sig Dispense Refill   citalopram   (CELEXA ) 40 MG tablet Take 1 tablet (40 mg total) by mouth daily. 30 tablet 3   apixaban (ELIQUIS) 2.5 MG TABS tablet Take 1 tablet (2.5 mg total) by mouth 2 (two) times daily. 60 tablet 0   Calcium Carb-Cholecalciferol (CALCIUM 1000 + D PO) Take by mouth. (Patient not taking: Reported on 10/17/2023)     Cholecalciferol (VITAMIN D-3) 125 MCG (5000 UT) TABS Take by mouth.     dexamethasone  (DECADRON ) 4 MG tablet Take 2 tablets (8mg ) by mouth daily starting the day after carboplatin  for 3 days. Take with food 30 tablet 1   lactulose (CHRONULAC) 10 GM/15ML solution take15 to 30 ml 3 times a day when needed for constipation 236 mL 0   Multiple Vitamin (MULTIVITAMIN) tablet Take 1 tablet by mouth daily.     nystatin (MYCOSTATIN) 100000 UNIT/ML suspension Take 5 mLs (500,000 Units total) by mouth 4 (four) times daily. Swish and swallow 60 mL 0   omeprazole (PRILOSEC) 20 MG capsule Take 1 capsule (20 mg total) by mouth daily. 30 capsule 0   ondansetron  (ZOFRAN ) 8 MG tablet Take 1 tablet (8 mg total) by mouth every 8 (eight) hours as needed for nausea or vomiting. Start on the third day after carboplatin . 30 tablet 1   oxyCODONE (OXY IR/ROXICODONE) 5 MG immediate release tablet Take 1 tablet (5 mg total) by mouth every 8 (eight) hours as needed for severe pain (pain score 7-10). 30 tablet 0   polyethylene glycol powder (GLYCOLAX/MIRALAX) 17 GM/SCOOP powder Take by mouth once.     prochlorperazine  (COMPAZINE ) 10 MG tablet Take 1 tablet (10 mg total) by mouth every 6 (six) hours as needed for nausea or vomiting. (Patient not taking: Reported on 10/17/2023) 30 tablet 1   senna (SENOKOT) 8.6 MG TABS tablet Take 1 tablet (8.6 mg total) by mouth daily as needed for mild constipation. 120 tablet 0   No current facility-administered medications for this visit.   Facility-Administered Medications Ordered in Other Visits  Medication Dose Route Frequency Provider Last Rate Last Admin   heparin lock flush 100 unit/mL   500 Units Intravenous Once Timmy Forbes, MD        VITAL SIGNS: There were no vitals taken for this visit. There were no vitals filed for this visit.  Estimated body mass index is 26.89 kg/m as calculated from the following:   Height as of 10/04/23: 5' 2 (1.575 m).   Weight as of an earlier encounter on 10/17/23: 147 lb (66.7 kg).  LABS: CBC:    Component Value Date/Time   WBC 7.3 10/17/2023 1047   WBC 8.7 09/26/2023 1041   HGB 10.5 (L) 10/17/2023 1047   HCT 32.5 (L) 10/17/2023 1047   PLT 475 (H) 10/17/2023 1047   MCV 84.2 10/17/2023 1047   NEUTROABS 5.4 10/10/2023 0805   NEUTROABS 3,830 06/12/2018 0000   LYMPHSABS 1.5 10/10/2023 0805   MONOABS 0.6 10/10/2023 0805   EOSABS 0.2 10/10/2023 0805   BASOSABS 0.1 10/10/2023 0805   Comprehensive Metabolic Panel:    Component Value Date/Time   NA 134 (L) 10/17/2023 1047  NA 140 12/18/2018 0000   K 4.5 10/17/2023 1047   CL 103 10/17/2023 1047   CO2 20 (L) 10/17/2023 1047   BUN 27 (H) 10/17/2023 1047   BUN 20 12/18/2018 0000   CREATININE 1.20 (H) 10/17/2023 1047   GLUCOSE 141 (H) 10/17/2023 1047   CALCIUM 9.4 10/17/2023 1047   AST 26 10/17/2023 1047   ALT 23 10/17/2023 1047   ALKPHOS 93 10/17/2023 1047   BILITOT 0.7 10/17/2023 1047   PROT 7.3 10/17/2023 1047   ALBUMIN 3.4 (L) 10/17/2023 1047    RADIOGRAPHIC STUDIES: CT Chest W Contrast Result Date: 10/09/2023 CLINICAL DATA:  Peritoneal carcinomatosis. Staging. * Tracking Code: BO * EXAM: CT CHEST WITH CONTRAST TECHNIQUE: Multidetector CT imaging of the chest was performed during intravenous contrast administration. RADIATION DOSE REDUCTION: This exam was performed according to the departmental dose-optimization program which includes automated exposure control, adjustment of the mA and/or kV according to patient size and/or use of iterative reconstruction technique. CONTRAST:  60mL OMNIPAQUE  IOHEXOL  300 MG/ML  SOLN COMPARISON:  Abdomen pelvis CT 09/16/2023. FINDINGS:  Cardiovascular: Heart is nonenlarged. Trace pericardial fluid or thickening. The thoracic aorta is normal course and caliber. Mild atherosclerotic changes. Minimal coronary calcifications are seen. Mediastinum/Nodes: Preserved thyroid  gland. The thoracic esophagus has a normal course and caliber. No specific abnormal lymph node enlargement identified in the axillary region or hila. There are a few left internal mammary chain nodes identified such as image 50 and 63. The larger focus on image 63 measures 6 x 11 mm, abnormal for a node in this location. Small pre cardiophrenic nodes identified such as image 95 on the right side and image 102. Lesion on image 102 measures 15 x 7 mm. Abnormal for node in this location. Small subcarinal and paraesophageal nodes identified, less than a cm in short axis. Lungs/Pleura: There is some linear opacity lung bases likely scar or atelectasis. No consolidation, pneumothorax. Trace left-sided pleural fluid. There are several tiny lung nodules identified. Example right lower lobe measures 4 mm on series 4, image 76. Example right lower lobe image 65 measures 4 mm. Slightly larger focus superior segment right lower lobe on image 51 of series 4 measuring 5 mm. There are a few on the left side as well. Gallbladder calcified but the vast majority are noncalcified. Up to 20 nodules are seen in the right lung and and 15 on the left. Although these are small, with the abdominal findings are worrisome for early lung metastases. Upper Abdomen: Ascites identified liver and splenic masses. Gallstones. Please see recent abdomen and pelvis CT scan. Musculoskeletal: Scattered degenerative changes along the spine. Partial fusion of the vertebral bodies at T3 and T4 which trace listhesis of T3 on T4. Findings are likely congenital. IMPRESSION: Several scattered sub 5 mm bilateral lung nodules identified. Based on appearance and other findings these are worrisome for early lung metastases. Few  enlarged pre cardiophrenic mediastinal nodes as well as left internal mammary chain lymph nodes also worrisome for spread of disease. Tiny left pleural effusion. Please correlate with previous abdomen pelvis CT describing abdominal findings. Aortic Atherosclerosis (ICD10-I70.0). Electronically Signed   By: Adrianna Horde M.D.   On: 10/09/2023 14:23   CT ABDOMINAL MASS BIOPSY Result Date: 09/26/2023 INDICATION: 73 year old female with peritoneal carcinomatosis, presents for biopsy EXAM: CT BIOPSY MEDICATIONS: None. ANESTHESIA/SEDATION: Moderate (conscious) sedation was employed during this procedure. A total of Versed  1.0 mg and Fentanyl  50 mcg was administered intravenously. Moderate Sedation Time: 20 minutes. The patient's level  of consciousness and vital signs were monitored continuously by radiology nursing throughout the procedure under my direct supervision. FLUOROSCOPY TIME:  CT COMPLICATIONS: None PROCEDURE: Informed written consent was obtained from the patient after a thorough discussion of the procedural risks, benefits and alternatives. All questions were addressed. Maximal Sterile Barrier Technique was utilized including caps, mask, sterile gowns, sterile gloves, sterile drape, hand hygiene and skin antiseptic. A timeout was performed prior to the initiation of the procedure. Patient positioned supine on the CT gantry table. Scout CT acquired for planning purposes. The patient is prepped and draped in the usual sterile fashion. 1% lidocaine  was used for local anesthesia. Using CT guidance, introducer needle was advanced into the targeted mass in the left upper quadrant from an anterior approach. Once we confirmed needle tip position multiple 18 gauge core biopsy were acquired. Specimen placed into formalin. Needle was removed and a final CT was performed. Patient tolerated the procedure well and remained hemodynamically stable throughout. No complications were encountered and no significant blood loss.  IMPRESSION: Status post CT-guided biopsy of left upper quadrant peritoneal mass. Signed, Marciano Settles. Rexine Cater, RPVI [facility] Radiology Electronically Signed   By: Myrlene Asper D.O.   On: 09/26/2023 12:11   US  Paracentesis Result Date: 09/24/2023 INDICATION: 73 year old female. History of peritoneal carcinomatosis with recurrent ascites. Request is for therapeutic and diagnostic paracentesis EXAM: ULTRASOUND GUIDED THERAPEUTIC AND DIAGNOSTIC LEFT-SIDED PARACENTESIS MEDICATIONS: lidocaine  1% 10 mL COMPLICATIONS: None immediate. PROCEDURE: Informed written consent was obtained from the patient after a discussion of the risks, benefits and alternatives to treatment. A timeout was performed prior to the initiation of the procedure. Initial ultrasound scanning demonstrates a small amount of ascites within the left lower abdominal quadrant. The left lower abdomen was prepped and draped in the usual sterile fashion. 1% lidocaine  was used for local anesthesia. Following this, a 19 gauge, 7-cm, Yueh catheter was introduced. An ultrasound image was saved for documentation purposes. The paracentesis was performed. The catheter was removed and a dressing was applied. The patient tolerated the procedure well without immediate post procedural complication. FINDINGS: A total of approximately 2.5 L of straw-colored fluid was removed. Samples were sent to the laboratory as requested by the clinical team. IMPRESSION: Successful ultrasound-guided therapeutic and diagnostic left-sided paracentesis yielding 2 point liters of peritoneal fluid. Performed by Reagan Camera NP Electronically Signed   By: Fernando Hoyer M.D.   On: 09/24/2023 12:44    PERFORMANCE STATUS (ECOG) : 1 - Symptomatic but completely ambulatory  Review of Systems Unless otherwise noted, a complete review of systems is negative.  Physical Exam General: NAD Pulmonary: Unlabored Extremities:  no edema, no joint deformities Skin: no rashes Neurological: Weakness but otherwise nonfocal  IMPRESSION: Follow-up visit.  Patient saw Dr. Wilhelmenia Harada today to discuss additional treatments.  Decision was made to hold off on chemotherapy today and reevaluate in 2 weeks.  Patient states that she came to clinic today with a thought that she would discontinue treatment and pursue hospice.  However, after further discussion, she is more willing to try to focus on symptom management and see if quality of life can be improved and then make decisions in the future regarding treatment  Symptomatically, she is fatigued.  She is starting daily dexamethasone  for fatigue and appetite.  Dr. Wilhelmenia Harada is also starting patient on Eliquis with plan to then initiate Megace for poor appetite.  Patient states that her pain is improved.  She does continue to  endorse depressive symptoms.  Patient has been on citalopram  for many years at 20 mg/day.  Patient then dose reduced to 10 mg daily and recently went back to 20 mg/day.  Will increase to 40 mg daily.  Constipation is resolved on lactulose.  PLAN: - Continue current scope of treatment - Patient deciding on whether to pursue additional treatment versus hospice - Increase citalopram  40 mg daily -Agree with daily dexamethasone  - Patient starting Eliquis and then plans to initiate Megace - Continue daily bowel regimen - Follow-up telephone visit 3 to 4 weeks  Case and plan discussed with Dr. Reviewed  Patient expressed understanding and was in agreement with this plan. She also understands that She can call the clinic at any time with any questions, concerns, or complaints.     Time Total: 20 minutes  Visit consisted of counseling and education dealing with the complex and emotionally intense issues of symptom management and palliative care in the setting of serious and potentially life-threatening illness.Greater than 50%  of this time was spent counseling and  coordinating care related to the above assessment and plan.  Signed by: Gerilyn Kobus, PhD, NP-C

## 2023-10-17 NOTE — Assessment & Plan Note (Signed)
 The patient appears to be exacerbated due to cancer diagnosis and treatments. Depression likely attributed/exacerbated for symptoms of fatigue, decreased appetite, insomnia. She has been on Celexa  20 mg daily.  Recommend to increase to 40 mg daily.  Recommend patient to follow-up with primary care provider closely.

## 2023-10-17 NOTE — Assessment & Plan Note (Signed)
 Encouraged oral hydration, avoid nephrotoxins.

## 2023-10-17 NOTE — Progress Notes (Signed)
 Nutrition Assessment   Reason for Assessment:   Poor appetite, weight loss   ASSESSMENT:  73 year old female with high grade serous carcinoma of ovaries with peritoneal carcinomatosis and liver mets.  Past medical history of depression, anemia.  Patient receiving carboplatin , taxol  (1 cycle).    Met with patient and husband following MD visit today. Reports poor appetite for the past month, sips and bites.  Feels full, no appetite, constipation.  Constipation improved with lactulose.  Has been taking sprite, gingerale, few bites of mashed potatoes, toast.  Reports when she chews foods it gets bigger in her mouth.  Tried a coffee protein shake that she liked in the past but was not able to tolerate it  recently.  Has not taken nausea medication.    Medications: prilosec, compazine , zofran , lactulose, MVI, calcium and vit D   Labs: Na 134, glucose 141, BUN 27, creatinine 1.20   Anthropometrics:   Height: 62 inches Weight: 147 lb 163 lb on 06/03/23 BMI: 26  9% weight loss in the last 4 months, significant   Estimated Energy Needs  Kcals: 1675-2000 Protein: 83-100 g Fluid: 1675-2000 ml   NUTRITION DIAGNOSIS: Inadequate oral intake related to cancer and related side effects as evidenced by 9% weight loss in the last 4 months, eating less than 50% of estimated energy intake for the last month, constipation, taste alterations, full feeling.     INTERVENTION:  Encouraged trying nausea medication Discussed easy to digest, tolerate foods (soups, toast, sherbet, etc).  List provided Encouraged snacks/mini meals every 1-2 hours. Samples of ensure complete, boost VHC, [employer] 1.4, ensure clear, unjury protein powder given to patient to try. Recipe book given with shakes recipes and other ideas provided Contact information provided   MONITORING, EVALUATION, GOAL: weight trends, intake   Next Visit: no follow-up at this time Patient prefers to call RD if needed  Omer Puccinelli B.  Zollie Hipp, CSO, LDN Registered Dietitian [phone]

## 2023-10-17 NOTE — Addendum Note (Signed)
 Addended by: Timmy Forbes on: 10/17/2023 08:11 PM   Modules accepted: Orders

## 2023-10-17 NOTE — Assessment & Plan Note (Addendum)
 Image findings and pathology results were reviewed and discussed with patient. 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  Normal CEA.  Elevated CA125 609, elevated CA 27.29 -415 CT chest official reading is pending. Recommended neoadjuvant chemotherapy with carboplatin /Taxol  +/- Bevacizumab for 3-6 cycles followed by reimaging, followed by possible debulking surgery. Check HRD - pending    Labs are reviewed and discussed with patient. Status post cycle 1 Carboplatin  AUC 6 with Paclitaxel  175mg /m2 patient did not tolerate well. CBC and CMP counts are stable. She has expressed desire of stopping treatments and going hospice.  She appears to be very depressed.  I had a lengthy discussion with patient and her husband.  Ultimately it is her decision however I encouraged her not to rush to a decision for comfort care/hospice when her depression is not very well-controlled.  We can consider dose reduction along with her cycle 2 treatments.  Patient agrees.   follow-up with palliative care service.  Bernabe Brew risk score is 3, high risk of thrombosis.  Recommend Eliquis 2.5 mg twice daily for prophylaxis.  30 days free coupon was provided to patient.

## 2023-10-17 NOTE — Progress Notes (Signed)
 Hematology/Oncology Progress note [phone] [phone]    CHIEF COMPLAINTS/REASON FOR VISIT:  High-grade serous carcinoma of ovaries. peritoneal carcinomatosis, liver lesion.   ASSESSMENT & PLAN:    Cancer Staging  Ovarian cancer [facility]) Staging form: Ovary, Fallopian Tube, and Primary Peritoneal Carcinoma, AJCC 8th Edition - Clinical stage from 09/25/2023: FIGO Stage IV (cT3c, cN1b, cM1) - Signed by Timmy Forbes, MD on 10/05/2023   Ovarian cancer [facility]) Image findings and pathology results were reviewed and discussed with patient. 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  Normal CEA.  Elevated CA125 609, elevated CA 27.29 -415 CT chest official reading is pending. Recommended neoadjuvant chemotherapy with carboplatin /Taxol  +/- Bevacizumab for 3-6 cycles followed by reimaging, followed by possible debulking surgery. Check HRD - pending    Labs are reviewed and discussed with patient. Status post cycle 1 Carboplatin  AUC 6 with Paclitaxel  175mg /m2 patient did not tolerate well. CBC and CMP counts are stable. She has expressed desire of stopping treatments and going hospice.  She appears to be very depressed.  I had a lengthy discussion with patient and her husband.  Ultimately it is her decision however I encouraged her not to rush to a decision for comfort care/hospice when her depression is not very well-controlled.  We can consider dose reduction along with her cycle 2 treatments.  Patient agrees.   follow-up with palliative care service.  Bernabe Brew risk score is 3, high risk of thrombosis.  Recommend Eliquis 2.5 mg twice daily for prophylaxis.  30 days free coupon was provided to patient.  Iron  deficiency anemia Labs are reviewed and discussed with patient. Lab Results  Component Value Date   HGB 10.5 (L) 10/17/2023   TIBC 314 09/18/2023   IRONPCTSAT 4 (L)  09/18/2023   FERRITIN 197 09/18/2023    She has tolerated first dose of Venofer .  She declined additional Venofer  today.  Hemoglobin has improved.   CKD (chronic kidney disease) stage 3, GFR 30-59 ml/min (HCC) Encouraged oral hydration, avoid nephrotoxins.   Depression The patient appears to be exacerbated due to cancer diagnosis and treatments. Depression likely attributed/exacerbated for symptoms of fatigue, decreased appetite, insomnia. She has been on Celexa  20 mg daily.  Recommend to increase to 40 mg daily.  Recommend patient to follow-up with primary care provider closely.  Weight loss Likely secondary to decreased oral intake due to decreased appetite, depression. Follow-up with nutritionist  Recommend dexamethasone  4 mg twice daily for 4 days. Once she is started on prophylactic Eliquis 2.5 mg twice daily, consider trials of Megace  Chemotherapy-induced neuropathy (HCC) Grade 1/2  Monitor.   No orders of the defined types were placed in this encounter.  Follow-up 2 weeks All questions were answered. The patient knows to call the clinic with any problems, questions or concerns.  Timmy Forbes, MD, PhD [facility] Hematology Oncology 10/17/2023   HISTORY OF PRESENTING ILLNESS:   Beth Hart is a  73 y.o.  female with PMH listed below presents for follow up of stage IV ovarian high grade serous carcinoma.  Oncology History  Peritoneal carcinomatosis (HCC)  09/18/2023 Initial Diagnosis   Peritoneal carcinomatosis (HCC)   10/10/2023 -  Chemotherapy   Patient is on Treatment Plan : OVARIAN Carboplatin  (AUC 6) + Paclitaxel  (175) q21d X 6 Cycles     Ovarian cancer (HCC)  09/25/2023 Cancer Staging   Staging form: Ovary, Fallopian Tube, and Primary Peritoneal Carcinoma, AJCC 8th Edition - Clinical  stage from 09/25/2023: FIGO Stage IV (cT3c, cN1b, cM1) - Signed by Timmy Forbes, MD on 10/05/2023 Stage prefix: Initial diagnosis   10/04/2023 Initial Diagnosis   Ovarian  cancer  She experiences pressure and sharp pain in her abdomen, initially attributing it to a pulled muscle from yard work. The pain persisted and was accompanied by a fever, prompting her to visit the ER on 09/16/2023. The fever was around 100F at home and remained at that level upon arrival at the ER. She has a persistent cough, especially when lying down.   09/16/2023, CT abdomen pelvis with contrast showed extensive omental and peritoneal carcinomatosis with small to moderate ascites. multiple hypoattenuating masses in the liver and spleen, compatible with metastases. There are also multiple mildly enlarged retroperitoneal lymph nodes, also highly concerning for metastases.  09/24/2023, cytology from peritoneal ascites showed negative for malignancy. 09/25/2023, endometrial biopsy showed no malignancy. 09/26/2023 Patient underwent peritoneal mass biopsy,  1. Peritoneum, biopsy, RUQ mass :       - METASTATIC HIGH-GRADE SEROUS CARCINOMA WITH NECROSIS, SEE NOTE   Diagnosis Note : Immunohistochemical stains show that the tumor cells are positive for PAX8, p16 and p53 (clonal overexpression pattern) while they are negative for p63 and CK5/6.  This immunoprofile is consistent with above interpretation.       10/10/2023 -  Chemotherapy   Patient is on Treatment Plan : OVARIAN Carboplatin  (AUC 6) + Paclitaxel  (175) q21d X 6 Cycles      Imaging   CT chest w contrast  Several scattered sub 5 mm bilateral lung nodules identified. Based on appearance and other findings these are worrisome for early lung metastases. Few enlarged pre cardiophrenic mediastinal nodes as well as left internal mammary chain lymph nodes also worrisome for spread of disease.Tiny left pleural effusion.   Please correlate with previous abdomen pelvis CT describing abdominal findings.      Her family history includes father who had lung cancer attributed to smoking, but no history of colon, ovarian, or breast cancer.  She  underwent a colonoscopy a few years ago, which was reported to be negative.  And a mammogram six months ago, which was negative she does not recall any recent stomach pain, dysphagia, or acid reflux.  INTERVAL HISTORY Beth Hart is a 73 y.o. female who has above history reviewed by me today presents for follow up visit for Stage IV ovarian cancer.  Patient's status post first cycle of carboplatin /Taxol . Patient reports doing poorly after treatments.  She has a marked loss of appetite, stating she 'can't eat, can't drink, can't enjoy life.' No nausea or vomiting, but she feels as though food and drink are stuck in her throat, although they do go down. She managed to eat a slice of toast and a handful of cream potatoes today, but her taste is altered.  She reports constipation following chemotherapy, which was treated with Senokot and lactulose, resulting in loose stools. Her last bowel movement was this morning and was loose. She has not needed to use oxycodone for pain.  She is experiencing numbness and tingling in her fingers and toes, which she did not have before chemotherapy. She describes it as more numbness than tingling.  She has a history of depression, managed with Celexa  20mg  daily.  She sleeps from 10 PM to 5 AM, although her sleep is interrupted. She has a history of aching joints and ankles, and her stomach feels distended and tight, though not painful.    MEDICAL HISTORY:  Past Medical History:  Diagnosis Date   Anemia    Arthritis    Asthma    as a child   Chicken pox    Colon polyps 2012   Depression    Hemorrhoids     SURGICAL HISTORY: Past Surgical History:  Procedure Laterality Date   BREAST CYST ASPIRATION Left 90s   BREAST SURGERY  2007   aspiration   CARPAL TUNNEL RELEASE  2010   REFRACTIVE SURGERY     TONSILLECTOMY      SOCIAL HISTORY: Social History   Socioeconomic History   Marital status: Married    Spouse name: Not on file   Number of  children: Not on file   Years of education: Not on file   Highest education level: Not on file  Occupational History   Not on file  Tobacco Use   Smoking status: Never   Smokeless tobacco: Never  Vaping Use   Vaping status: Never Used  Substance and Sexual Activity   Alcohol use: No   Drug use: No   Sexual activity: Yes  Other Topics Concern   Not on file  Social History Narrative   married   Social Drivers of Corporate investment banker Strain: Low Risk  (06/03/2023)   Overall Financial Resource Strain (CARDIA)    Difficulty of Paying Living Expenses: Not hard at all  Food Insecurity: No Food Insecurity (09/25/2023)   Hunger Vital Sign    Worried About Running Out of Food in the Last Year: Never true    Ran Out of Food in the Last Year: Never true  Transportation Needs: No Transportation Needs (09/25/2023)   PRAPARE - Administrator, Civil Service (Medical): No    Lack of Transportation (Non-Medical): No  Physical Activity: Insufficiently Active (06/03/2023)   Exercise Vital Sign    Days of Exercise per Week: 3 days    Minutes of Exercise per Session: 10 min  Stress: No Stress Concern Present (06/03/2023)   [employer] of Occupational Health - Occupational Stress Questionnaire    Feeling of Stress : Not at all  Social Connections: Socially Integrated (06/03/2023)   Social Connection and Isolation Panel    Frequency of Communication with Friends and Family: More than three times a week    Frequency of Social Gatherings with Friends and Family: Three times a week    Attends Religious Services: More than 4 times per year    Active Member of Clubs or Organizations: Yes    Attends Banker Meetings: More than 4 times per year    Marital Status: Married  Catering manager Violence: Not At Risk (09/25/2023)   Humiliation, Afraid, Rape, and Kick questionnaire    Fear of Current or Ex-Partner: No    Emotionally Abused: No    Physically Abused: No     Sexually Abused: No    FAMILY HISTORY: Family History  Problem Relation Age of Onset   Alzheimer's disease Mother    Lung cancer Father    Prostate cancer Maternal Uncle    Prostate cancer Maternal Uncle    Prostate cancer Maternal Uncle    Prostate cancer Maternal Uncle    Prostate cancer Maternal Uncle    Prostate cancer Other        in mat great uncles, unk#   Prostate cancer Cousin        in 4-5 mat  first cousins   Breast cancer Neg Hx     ALLERGIES:  has no known allergies.  MEDICATIONS:  Current Outpatient Medications  Medication Sig Dispense Refill   apixaban (ELIQUIS) 2.5 MG TABS tablet Take 1 tablet (2.5 mg total) by mouth 2 (two) times daily. 60 tablet 0   Cholecalciferol (VITAMIN D-3) 125 MCG (5000 UT) TABS Take by mouth.     dexamethasone  (DECADRON ) 4 MG tablet Take 2 tablets (8mg ) by mouth daily starting the day after carboplatin  for 3 days. Take with food 30 tablet 1   lactulose (CHRONULAC) 10 GM/15ML solution take15 to 30 ml 3 times a day when needed for constipation 236 mL 0   Multiple Vitamin (MULTIVITAMIN) tablet Take 1 tablet by mouth daily.     nystatin (MYCOSTATIN) 100000 UNIT/ML suspension Take 5 mLs (500,000 Units total) by mouth 4 (four) times daily. Swish and swallow 60 mL 0   omeprazole (PRILOSEC) 20 MG capsule Take 1 capsule (20 mg total) by mouth daily. 30 capsule 0   ondansetron  (ZOFRAN ) 8 MG tablet Take 1 tablet (8 mg total) by mouth every 8 (eight) hours as needed for nausea or vomiting. Start on the third day after carboplatin . 30 tablet 1   oxyCODONE (OXY IR/ROXICODONE) 5 MG immediate release tablet Take 1 tablet (5 mg total) by mouth every 8 (eight) hours as needed for severe pain (pain score 7-10). 30 tablet 0   polyethylene glycol powder (GLYCOLAX/MIRALAX) 17 GM/SCOOP powder Take by mouth once.     senna (SENOKOT) 8.6 MG TABS tablet Take 1 tablet (8.6 mg total) by mouth daily as needed for mild constipation. 120 tablet 0   Calcium  Carb-Cholecalciferol (CALCIUM 1000 + D PO) Take by mouth. (Patient not taking: Reported on 10/17/2023)     citalopram  (CELEXA ) 40 MG tablet Take 1 tablet (40 mg total) by mouth daily. 30 tablet 3   prochlorperazine  (COMPAZINE ) 10 MG tablet Take 1 tablet (10 mg total) by mouth every 6 (six) hours as needed for nausea or vomiting. (Patient not taking: Reported on 10/17/2023) 30 tablet 1   No current facility-administered medications for this visit.   Facility-Administered Medications Ordered in Other Visits  Medication Dose Route Frequency Provider Last Rate Last Admin   heparin lock flush 100 unit/mL  500 Units Intravenous Once Timmy Forbes, MD        Review of Systems  Constitutional:  Positive for appetite change, fatigue and unexpected weight change. Negative for chills and fever.  HENT:   Negative for hearing loss and voice change.   Eyes:  Negative for eye problems.  Respiratory:  Negative for chest tightness and cough.   Cardiovascular:  Negative for chest pain.  Gastrointestinal:  Positive for abdominal pain. Negative for abdominal distention, blood in stool, nausea and vomiting.  Endocrine: Negative for hot flashes.  Genitourinary:  Negative for difficulty urinating and frequency.   Musculoskeletal:  Negative for arthralgias.  Skin:  Negative for itching and rash.  Neurological:  Negative for extremity weakness.  Hematological:  Negative for adenopathy.  Psychiatric/Behavioral:  Positive for depression and sleep disturbance. Negative for confusion.    PHYSICAL EXAMINATION: ECOG PERFORMANCE STATUS: 1 - Symptomatic but completely ambulatory Vitals:   10/17/23 1118  BP: 119/82  Pulse: 100  Resp: 16  Temp: 98.2 F (36.8 C)  SpO2: 97%   Filed Weights   10/17/23 1118  Weight: 147 lb (66.7 kg)    Physical Exam Constitutional:      General: She is not in acute distress. HENT:     Head: Normocephalic and atraumatic.  Eyes:     General: No scleral  icterus.   Cardiovascular:     Rate and Rhythm: Normal rate and regular rhythm.     Heart sounds: Normal heart sounds.  Pulmonary:     Effort: Pulmonary effort is normal. No respiratory distress.     Breath sounds: No wheezing.  Abdominal:     General: Bowel sounds are normal. There is no distension.     Palpations: Abdomen is soft.   Musculoskeletal:        General: No deformity. Normal range of motion.     Cervical back: Normal range of motion and neck supple.   Skin:    General: Skin is warm and dry.     Findings: No erythema or rash.   Neurological:     Mental Status: She is alert and oriented to person, place, and time. Mental status is at baseline.   Psychiatric:        Mood and Affect: Mood normal.     LABORATORY DATA:  I have reviewed the data as listed    Latest Ref Rng & Units 10/17/2023   10:47 AM 10/14/2023    1:01 PM 10/10/2023    8:05 AM  CBC  WBC 4.0 - 10.5 K/uL 7.3  7.0  7.7   Hemoglobin 12.0 - 15.0 g/dL 16.1  09.6  8.5   Hematocrit 36.0 - 46.0 % 32.5  33.0  27.1   Platelets 150 - 400 K/uL 475  498  470       Latest Ref Rng & Units 10/17/2023   10:47 AM 10/14/2023    1:00 PM 10/10/2023    8:04 AM  CMP  Glucose 70 - 99 mg/dL 045  99  409   BUN 8 - 23 mg/dL 27  39  27   Creatinine 0.44 - 1.00 mg/dL 8.11  9.14  7.82   Sodium 135 - 145 mmol/L 134  136  140   Potassium 3.5 - 5.1 mmol/L 4.5  4.9  4.7   Chloride 98 - 111 mmol/L 103  103  105   CO2 22 - 32 mmol/L 20  24  23    Calcium 8.9 - 10.3 mg/dL 9.4  8.7  8.8   Total Protein 6.5 - 8.1 g/dL 7.3  6.7  6.6   Total Bilirubin 0.0 - 1.2 mg/dL 0.7  0.5  0.5   Alkaline Phos 38 - 126 U/L 93  94  90   AST 15 - 41 U/L 26  30  21    ALT 0 - 44 U/L 23  22  13        RADIOGRAPHIC STUDIES: I have personally reviewed the radiological images as listed and agreed with the findings in the report. CT Chest W Contrast Result Date: 10/09/2023 CLINICAL DATA:  Peritoneal carcinomatosis. Staging. * Tracking Code: BO *  EXAM: CT CHEST WITH CONTRAST TECHNIQUE: Multidetector CT imaging of the chest was performed during intravenous contrast administration. RADIATION DOSE REDUCTION: This exam was performed according to the departmental dose-optimization program which includes automated exposure control, adjustment of the mA and/or kV according to patient size and/or use of iterative reconstruction technique. CONTRAST:  60mL OMNIPAQUE  IOHEXOL  300 MG/ML  SOLN COMPARISON:  Abdomen pelvis CT 09/16/2023. FINDINGS: Cardiovascular: Heart is nonenlarged. Trace pericardial fluid or thickening. The thoracic aorta is normal course and caliber. Mild atherosclerotic changes. Minimal coronary calcifications are seen. Mediastinum/Nodes: Preserved thyroid  gland. The thoracic esophagus has a normal course and caliber. No specific abnormal lymph node  enlargement identified in the axillary region or hila. There are a few left internal mammary chain nodes identified such as image 50 and 63. The larger focus on image 63 measures 6 x 11 mm, abnormal for a node in this location. Small pre cardiophrenic nodes identified such as image 95 on the right side and image 102. Lesion on image 102 measures 15 x 7 mm. Abnormal for node in this location. Small subcarinal and paraesophageal nodes identified, less than a cm in short axis. Lungs/Pleura: There is some linear opacity lung bases likely scar or atelectasis. No consolidation, pneumothorax. Trace left-sided pleural fluid. There are several tiny lung nodules identified. Example right lower lobe measures 4 mm on series 4, image 76. Example right lower lobe image 65 measures 4 mm. Slightly larger focus superior segment right lower lobe on image 51 of series 4 measuring 5 mm. There are a few on the left side as well. Gallbladder calcified but the vast majority are noncalcified. Up to 20 nodules are seen in the right lung and and 15 on the left. Although these are small, with the abdominal findings are worrisome for  early lung metastases. Upper Abdomen: Ascites identified liver and splenic masses. Gallstones. Please see recent abdomen and pelvis CT scan. Musculoskeletal: Scattered degenerative changes along the spine. Partial fusion of the vertebral bodies at T3 and T4 which trace listhesis of T3 on T4. Findings are likely congenital. IMPRESSION: Several scattered sub 5 mm bilateral lung nodules identified. Based on appearance and other findings these are worrisome for early lung metastases. Few enlarged pre cardiophrenic mediastinal nodes as well as left internal mammary chain lymph nodes also worrisome for spread of disease. Tiny left pleural effusion. Please correlate with previous abdomen pelvis CT describing abdominal findings. Aortic Atherosclerosis (ICD10-I70.0). Electronically Signed   By: Adrianna Horde M.D.   On: 10/09/2023 14:23   CT ABDOMINAL MASS BIOPSY Result Date: 09/26/2023 INDICATION: 73 year old female with peritoneal carcinomatosis, presents for biopsy EXAM: CT BIOPSY MEDICATIONS: None. ANESTHESIA/SEDATION: Moderate (conscious) sedation was employed during this procedure. A total of Versed  1.0 mg and Fentanyl  50 mcg was administered intravenously. Moderate Sedation Time: 20 minutes. The patient's level of consciousness and vital signs were monitored continuously by radiology nursing throughout the procedure under my direct supervision. FLUOROSCOPY TIME:  CT COMPLICATIONS: None PROCEDURE: Informed written consent was obtained from the patient after a thorough discussion of the procedural risks, benefits and alternatives. All questions were addressed. Maximal Sterile Barrier Technique was utilized including caps, mask, sterile gowns, sterile gloves, sterile drape, hand hygiene and skin antiseptic. A timeout was performed prior to the initiation of the procedure. Patient positioned supine on the CT gantry table. Scout CT acquired for planning purposes. The patient is prepped and draped in the usual sterile  fashion. 1% lidocaine  was used for local anesthesia. Using CT guidance, introducer needle was advanced into the targeted mass in the left upper quadrant from an anterior approach. Once we confirmed needle tip position multiple 18 gauge core biopsy were acquired. Specimen placed into formalin. Needle was removed and a final CT was performed. Patient tolerated the procedure well and remained hemodynamically stable throughout. No complications were encountered and no significant blood loss. IMPRESSION: Status post CT-guided biopsy of left upper quadrant peritoneal mass. Signed, Marciano Settles. Rexine Cater, RPVI [facility] Electronically Signed   By: Myrlene Asper D.O.   On: 09/26/2023 12:11   US  Paracentesis Result Date: 09/24/2023 INDICATION: 73 year old female. History of  peritoneal carcinomatosis with recurrent ascites. Request is for therapeutic and diagnostic paracentesis EXAM: ULTRASOUND GUIDED THERAPEUTIC AND DIAGNOSTIC LEFT-SIDED PARACENTESIS MEDICATIONS: lidocaine  1% 10 mL COMPLICATIONS: None immediate. PROCEDURE: Informed written consent was obtained from the patient after a discussion of the risks, benefits and alternatives to treatment. A timeout was performed prior to the initiation of the procedure. Initial ultrasound scanning demonstrates a small amount of ascites within the left lower abdominal quadrant. The left lower abdomen was prepped and draped in the usual sterile fashion. 1% lidocaine  was used for local anesthesia. Following this, a 19 gauge, 7-cm, Yueh catheter was introduced. An ultrasound image was saved for documentation purposes. The paracentesis was performed. The catheter was removed and a dressing was applied. The patient tolerated the procedure well without immediate post procedural complication. FINDINGS: A total of approximately 2.5 L of straw-colored fluid was removed. Samples were sent to the laboratory as requested by the  clinical team. IMPRESSION: Successful ultrasound-guided therapeutic and diagnostic left-sided paracentesis yielding 2 point liters of peritoneal fluid. Performed by Reagan Camera NP Electronically Signed   By: Fernando Hoyer M.D.   On: 09/24/2023 12:44   CT ABDOMEN PELVIS W CONTRAST Result Date: 09/16/2023 CLINICAL DATA:  Abdominal pain, acute, nonlocalized. * Tracking Code: BO * EXAM: CT ABDOMEN AND PELVIS WITH CONTRAST TECHNIQUE: Multidetector CT imaging of the abdomen and pelvis was performed using the standard protocol following bolus administration of intravenous contrast. RADIATION DOSE REDUCTION: This exam was performed according to the departmental dose-optimization program which includes automated exposure control, adjustment of the mA and/or kV according to patient size and/or use of iterative reconstruction technique. CONTRAST:  OMNIPAQUE  IOHEXOL  300 MG/ML  SOLN COMPARISON:  None Available. FINDINGS: Lower chest: There are at least 3, sub 4 mm, solid noncalcified nodules in the visualized bilateral lungs with largest measuring up to 3.4 x 4.0 mm in the right lung lower lobe (series 4, image 1), partially seen. Please see follow-up recommendations below. There are patchy atelectatic changes in the visualized lung bases. No overt consolidation. No pleural effusion. The heart is normal in size. No pericardial effusion. Hepatobiliary: The liver is normal in size. Non-cirrhotic configuration. There are at least 3, hypoattenuating masses in the right hepatic lobe with largest in the segment 6, subcapsular location measuring 3.5 x 4.5 cm, compatible with metastases. There also ill-defined is similar characteristic hypoattenuating areas surrounding the liver along the region of caudate lobe, highly concerning for serosal implants. There is an additional 2.1 x 2.2 cm hypoattenuating lesion in the left hepatic lobe, segments 2/4 a, which may represent a cyst. No intrahepatic or extrahepatic bile  duct dilation. Small volume dependent calcified gallstones noted without imaging signs of acute cholecystitis. There is focal wall thickening near the fundus of the gallbladder, most likely secondary to underlying adenomyomatosis. Normal gallbladder wall thickness. No pericholecystic inflammatory changes. Pancreas: Small/atrophic pancreas. No discrete suspicious mass noted. Main pancreatic duct is not dilated. No peripancreatic fat stranding. Spleen: Normal size spleen. There are multiple hypoattenuating masses with largest along the lateral subcapsular area measuring 2.0 x 3.2 cm, highly concerning for metastases. Adrenals/Urinary Tract: Adrenal glands are unremarkable. Bilateral kidneys are small/atrophic and exhibits persistent fetal lobulations. No hydroureteronephrosis or nephroureterolithiasis. Unremarkable urinary bladder. Stomach/Bowel: No disproportionate dilation of the small or large bowel loops. No evidence of abnormal bowel wall thickening or inflammatory changes. The appendix was not distinctly separately visualized; however there is no acute inflammatory process in the right lower quadrant. Vascular/Lymphatic: There is small to moderate  ascites. There are extensive centrally necrotic omental implants as well as multiple irregular hyperattenuating nodules along the peritoneal reflections, compatible with extensive peritoneal carcinomatosis. There multiple mildly enlarged retroperitoneal lymph nodes (for example, series 2, images 36, 41, 48, 55, etc.), with largest lymph node measuring up to 1.1 x 1.8 cm, also highly concerning for metastases. 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 Reproductive: Limited evaluation on CT scan exam. Normal-size anteverted uterus noted. 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 There is mixed solid/cystic appearance of bilateral ovaries, which may be due to drop metastasis along the  surface. However, correlate clinically and with tumor markers to determine the need for additional imaging with contrast-enhanced MRI pelvis. Other: There are tumor deposit along the undersurface of the bilateral hemidiaphragm, right more than left. There are small fat containing umbilical and right inguinal hernias. The soft tissues and abdominal wall are otherwise unremarkable. Musculoskeletal: No suspicious osseous lesions. There are mild multilevel degenerative changes in the visualized spine. IMPRESSION: 1. There are findings compatible with extensive omental and peritoneal carcinomatosis with small to moderate ascites. There are multiple hypoattenuating masses in the liver and spleen, compatible with metastases. There are also multiple mildly enlarged retroperitoneal lymph nodes, also highly concerning for metastases. 2. There is mixed solid/cystic appearance of bilateral ovaries, which may be due to drop metastasis along the surface. However, correlate clinically and with tumor markers to determine the need for additional imaging with contrast-enhanced MRI pelvis. 3. There are at least 3, sub 4 mm, solid noncalcified nodules in the visualized bilateral lungs with largest measuring up to 3.4 x 4.0 mm in the right lung lower lobe, partially seen. In the given clinical context, these are also highly concerning for metastases. Consider further evaluation with dedicated chest CT and establish a baseline. 4. Multiple other nonacute observations, as described above. Electronically Signed   By: Beula Brunswick M.D.   On: 09/16/2023 12:10

## 2023-10-17 NOTE — Assessment & Plan Note (Signed)
 Likely secondary to decreased oral intake due to decreased appetite, depression. Follow-up with nutritionist  Recommend dexamethasone  4 mg twice daily for 4 days. Once she is started on prophylactic Eliquis 2.5 mg twice daily, consider trials of Megace

## 2023-10-17 NOTE — Assessment & Plan Note (Signed)
 Labs are reviewed and discussed with patient. Lab Results  Component Value Date   HGB 10.5 (L) 10/17/2023   TIBC 314 09/18/2023   IRONPCTSAT 4 (L) 09/18/2023   FERRITIN 197 09/18/2023    She has tolerated first dose of Venofer .  She declined additional Venofer  today.  Hemoglobin has improved.

## 2023-10-17 NOTE — Assessment & Plan Note (Signed)
 Grade 1/2  Monitor.

## 2023-10-18 ENCOUNTER — Other Ambulatory Visit (HOSPITAL_BASED_OUTPATIENT_CLINIC_OR_DEPARTMENT_OTHER): Payer: Self-pay

## 2023-10-18 ENCOUNTER — Telehealth: Payer: Self-pay | Admitting: *Deleted

## 2023-10-18 NOTE — Telephone Encounter (Signed)
 The patient called today and wanted to ask Josh question about medication.  The patient was wanting a medication to enhance her able to eat but if she takes it she also has to to start Eliquis first.  Yesterday she said throughout the day she was able to eat and it felt good for her.  She thought maybe since she ate good yesterday and if it continues then she might not have to have the 2 medications that Dr. Elene Griffes had spoke about.  She wanted to see what Jerrilyn Moras thinks about that.  I went and talked to him and he said that lets see how you do over the weekend and call on Monday and then will go from there based on how much appetite you are doing.  She says that that is great and she will call back on Monday

## 2023-10-21 ENCOUNTER — Ambulatory Visit: Payer: Self-pay | Admitting: Licensed Clinical Social Worker

## 2023-10-21 ENCOUNTER — Encounter: Payer: Self-pay | Admitting: Licensed Clinical Social Worker

## 2023-10-21 ENCOUNTER — Inpatient Hospital Stay

## 2023-10-21 ENCOUNTER — Telehealth: Payer: Self-pay | Admitting: Licensed Clinical Social Worker

## 2023-10-21 DIAGNOSIS — Z1379 Encounter for other screening for genetic and chromosomal anomalies: Secondary | ICD-10-CM | POA: Insufficient documentation

## 2023-10-21 NOTE — Progress Notes (Signed)
 [facility] CSW Progress Note  Clinical Child psychotherapist contacted patient by phone to follow-up on treatment decision.    Interventions: Provided brief mental health counseling with regard to patient's decision to continue treatment.  Discussed her increased appetite  for the past four days.  She reports an improved mood due to this.       Follow Up Plan:  Patient will contact CSW with any support or resource needs    Beth CHRISTELLA Au, LCSW Clinical Social Worker [facility]

## 2023-10-21 NOTE — Telephone Encounter (Signed)
 I contacted Ms. Beth Hart to discuss her genetic testing results. No pathogenic variants were identified in the 40 genes analyzed. Detailed clinic note to follow.   The test report has been scanned into EPIC and is located under the Molecular Pathology section of the Results Review tab.  A portion of the result report is included below for reference.      Dena Cary, MS, [facility] Genetic Counselor [email or web address] Phone: [phone]

## 2023-10-21 NOTE — Progress Notes (Signed)
 HPI:   Beth Hart was previously seen in the [facility] due to a personal and family history of cancer and concerns regarding a hereditary predisposition to cancer. Please refer to our prior cancer genetics clinic note for more information regarding our discussion, assessment and recommendations, at the time. Beth Hart recent genetic test results were disclosed to her, as were recommendations warranted by these results. These results and recommendations are discussed in more detail below.  CANCER HISTORY:  Oncology History  Peritoneal carcinomatosis (HCC)  09/18/2023 Initial Diagnosis   Peritoneal carcinomatosis (HCC)   10/10/2023 -  Chemotherapy   Patient is on Treatment Plan : OVARIAN Carboplatin  (AUC 6) + Paclitaxel  (175) q21d X 6 Cycles      Genetic Testing   Negative genetic testing. No pathogenic variants identified on the [facility] CancerNext+RNA panel. The report date is 10/20/2023.  The [facility] CancerNext+RNAinsight Panel includes sequencing, rearrangement analysis, and RNA analysis for the following 40 genes: APC, ATM, BAP1, BARD1, BMPR1A, BRCA1, BRCA2, BRIP1, CDH1, CDKN2A, CHEK2, FH, FLCN, MET, MLH1, MSH2, MSH6, MUTYH, NF1, NTHL1, PALB2, PMS2, PTEN, RAD51C, RAD51D, RPS20, SMAD4, STK11, TP53, TSC1, TSC2, and VHL (sequencing and deletion/duplication); AXIN2, HOXB13, MBD4, MSH3, POLD1 and POLE (sequencing only); EPCAM and GREM1 (deletion/duplication only).   Ovarian cancer (HCC)  09/25/2023 Cancer Staging   Staging form: Ovary, Fallopian Tube, and Primary Peritoneal Carcinoma, AJCC 8th Edition - Clinical stage from 09/25/2023: FIGO Stage IV (cT3c, cN1b, cM1) - Signed by Babara Call, MD on 10/05/2023 Stage prefix: Initial diagnosis   10/04/2023 Initial Diagnosis   Ovarian cancer  She experiences pressure and sharp pain in her abdomen, initially attributing it to a pulled muscle from yard work. The pain persisted and was accompanied by a fever, prompting her to visit the  ER on 09/16/2023. The fever was around 100F at home and remained at that level upon arrival at the ER. She has a persistent cough, especially when lying down.   09/16/2023, CT abdomen pelvis with contrast showed extensive omental and peritoneal carcinomatosis with small to moderate ascites. multiple hypoattenuating masses in the liver and spleen, compatible with metastases. There are also multiple mildly enlarged retroperitoneal lymph nodes, also highly concerning for metastases.  09/24/2023, cytology from peritoneal ascites showed negative for malignancy. 09/25/2023, endometrial biopsy showed no malignancy. 09/26/2023 Patient underwent peritoneal mass biopsy,  1. Peritoneum, biopsy, RUQ mass :       - METASTATIC HIGH-GRADE SEROUS CARCINOMA WITH NECROSIS, SEE NOTE   Diagnosis Note : Immunohistochemical stains show that the tumor cells are positive for PAX8, p16 and p53 (clonal overexpression pattern) while they are negative for p63 and CK5/6.  This immunoprofile is consistent with above interpretation.       10/10/2023 -  Chemotherapy   Patient is on Treatment Plan : OVARIAN Carboplatin  (AUC 6) + Paclitaxel  (175) q21d X 6 Cycles      Imaging   CT chest w contrast  Several scattered sub 5 mm bilateral lung nodules identified. Based on appearance and other findings these are worrisome for early lung metastases. Few enlarged pre cardiophrenic mediastinal nodes as well as left internal mammary chain lymph nodes also worrisome for spread of disease.Tiny left pleural effusion.   Please correlate with previous abdomen pelvis CT describing abdominal findings.      Genetic Testing   Negative genetic testing. No pathogenic variants identified on the [facility] CancerNext+RNA panel. The report date is 10/20/2023.  The [facility] CancerNext+RNAinsight Panel includes sequencing, rearrangement analysis, and RNA analysis  for the following 40 genes: APC, ATM, BAP1, BARD1, BMPR1A, BRCA1, BRCA2, BRIP1, CDH1, CDKN2A,  CHEK2, FH, FLCN, MET, MLH1, MSH2, MSH6, MUTYH, NF1, NTHL1, PALB2, PMS2, PTEN, RAD51C, RAD51D, RPS20, SMAD4, STK11, TP53, TSC1, TSC2, and VHL (sequencing and deletion/duplication); AXIN2, HOXB13, MBD4, MSH3, POLD1 and POLE (sequencing only); EPCAM and GREM1 (deletion/duplication only).     FAMILY HISTORY:  We obtained a detailed, 4-generation family history.  Significant diagnoses are listed below: Family History  Problem Relation Age of Onset   Alzheimer's disease Mother    Lung cancer Father    Prostate cancer Maternal Uncle    Prostate cancer Maternal Uncle    Prostate cancer Maternal Uncle    Prostate cancer Maternal Uncle    Prostate cancer Maternal Uncle    Prostate cancer Other        in mat great uncles, unk#   Prostate cancer Cousin        in 4-5 mat  first cousins   Breast cancer Neg Hx    Beth Hart has 1 son, 31 and 2 grandsons. She has 1 brother, 92 and 2 nephews.    Beth Hart mother passed at 81, no cancer. She had 5 brothers who all had prostate cancer. Beth Hart has 4-5 first cousins (sons of her uncles with prostate cancer) who also had prostate cancer. Maternal grandmother's brothers also had prostate cancer, unknown number of great uncles.    Beth Hart father died of lung cancer at 54 and had history of smoking. No other known cancers on this side of the family.   Beth Hart is unaware of previous family history of genetic testing for hereditary cancer risks. There is no reported Ashkenazi Jewish ancestry. There is no known consanguinity.        GENETIC TEST RESULTS:  The [facility] CancerNext+RNA Panel found no pathogenic mutations.   The CancerNext-Expanded gene panel offered by [facility] and includes sequencing, rearrangement, and RNA analysis for the following 77 genes: AIP, ALK, APC, ATM, AXIN2, BAP1, BARD1, BMPR1A, BRCA1, BRCA2, BRIP1, CDC73, CDH1, CDK4, CDKN1B, CDKN2A, CEBPA, CHEK2, CTNNA1, DDX41, DICER1, ETV6, FH, FLCN, GATA2, LZTR1, MAX,  MBD4, MEN1, MET, MLH1, MSH2, MSH3, MSH6, MUTYH, NF1, NF2, NTHL1, PALB2, PHOX2B, PMS2, POT1, PRKAR1A, PTCH1, PTEN, RAD51C, RAD51D, RB1, RET, RPS20, RUNX1, SDHA, SDHAF2, SDHB, SDHC, SDHD, SMAD4, SMARCA4, SMARCB1, SMARCE1, STK11, SUFU, TMEM127, TP53, TSC1, TSC2, VHL, and WT1 (sequencing and deletion/duplication); EGFR, HOXB13, KIT, MITF, PDGFRA, POLD1, and POLE (sequencing only); EPCAM and GREM1 (deletion/duplication only).   The test report has been scanned into EPIC and is located under the Molecular Pathology section of the Results Review tab.  A portion of the result report is included below for reference. Genetic testing reported out on 10/20/2023.      Even though a pathogenic variant was not identified, possible explanations for the cancer in the family may include: There may be no hereditary risk for cancer in the family. The cancers in Beth Hart and/or her family may be sporadic/familial or due to other genetic and environmental factors. There may be a gene mutation in one of these genes that current testing methods cannot detect but that chance is small. There could be another gene that has not yet been discovered, or that we have not yet tested, that is responsible for the cancer diagnoses in the family.  It is also possible there is a hereditary cause for the cancer in the family that Beth Hart did not inherit.  Therefore, it is important to remain  in touch with cancer genetics in the future so that we can continue to offer Beth Hart the most up to date genetic testing.   ADDITIONAL GENETIC TESTING:  We discussed with Beth Hart that her genetic testing was fairly extensive.  If there are additional relevant genes identified to increase cancer risk that can be analyzed in the future, we would be happy to discuss and coordinate this testing at that time.    CANCER SCREENING RECOMMENDATIONS:  Beth Hart test result is considered negative (normal).  This means that we have not  identified a hereditary cause for her personal and family history of cancer at this time.   An individual's cancer risk and medical management are not determined by genetic test results alone. Overall cancer risk assessment incorporates additional factors, including personal medical history, family history, and any available genetic information that may result in a personalized plan for cancer prevention and surveillance. Therefore, it is recommended she continue to follow the cancer management and screening guidelines provided by her oncology and primary healthcare provider.  RECOMMENDATIONS FOR FAMILY MEMBERS:   Since she did not inherit a identifiable mutation in a cancer predisposition gene included on this panel, her children could not have inherited a known mutation from her in one of these genes. Individuals in this family might be at some increased risk of developing cancer, over the general population risk, due to the family history of cancer.  Individuals in the family should notify their providers of the family history of cancer. We recommend women in this family have a yearly mammogram beginning at age 33, or 22 years younger than the earliest onset of cancer, an annual clinical breast exam, and perform monthly breast self-exams.  Family members should have colonoscopies by at age 18, or earlier, as recommended by their providers. Other members of the family may still carry a pathogenic variant in one of these genes that Beth Hart did not inherit. Based on the family history, we recommend her maternal relatives who have had prostate cancer have genetic counseling and testing. Beth Hart will let us  know if we can be of any assistance in coordinating genetic counseling and/or testing for this family member.    FOLLOW-UP:  Lastly, we discussed with Beth Hart that cancer genetics is a rapidly advancing field and it is possible that new genetic tests will be appropriate for her and/or her  family members in the future. We encouraged her to remain in contact with cancer genetics on an annual basis so we can update her personal and family histories and let her know of advances in cancer genetics that may benefit this family.   Our contact number was provided. Beth Hart questions were answered to her satisfaction, and she knows she is welcome to call us  at anytime with additional questions or concerns.    Dena Cary, MS, [facility] Genetic Counselor [email or web address] Phone: [phone]

## 2023-10-22 ENCOUNTER — Telehealth: Payer: Self-pay | Admitting: *Deleted

## 2023-10-22 NOTE — Telephone Encounter (Signed)
 Patient left message and RN returned call. Pt stated she had not been eating well and saw Sidra Mower NP who prescribed prednisone and nausea medication.  Pt states she is doing much better and eating well.  Pt wanting to know if she should continue on prednisone?

## 2023-10-23 ENCOUNTER — Encounter: Payer: Self-pay | Admitting: Oncology

## 2023-10-23 NOTE — Telephone Encounter (Signed)
 Spoke with patient. Confirmed ok to stop the dexamethasone . Pt gave verbal understanding of the plan.

## 2023-10-24 NOTE — Progress Notes (Signed)
 Patient enrolled in services on 10/24/2023.  Initial behavioral health evaluation is scheduled for 11/18/2023.

## 2023-10-25 ENCOUNTER — Encounter: Payer: Self-pay | Admitting: Oncology

## 2023-10-28 DIAGNOSIS — C786 Secondary malignant neoplasm of retroperitoneum and peritoneum: Secondary | ICD-10-CM | POA: Diagnosis not present

## 2023-10-28 DIAGNOSIS — C569 Malignant neoplasm of unspecified ovary: Secondary | ICD-10-CM | POA: Diagnosis not present

## 2023-10-28 DIAGNOSIS — F32A Depression, unspecified: Secondary | ICD-10-CM | POA: Diagnosis not present

## 2023-10-31 ENCOUNTER — Inpatient Hospital Stay: Admitting: Oncology

## 2023-10-31 ENCOUNTER — Encounter: Payer: Self-pay | Admitting: Oncology

## 2023-10-31 ENCOUNTER — Inpatient Hospital Stay: Attending: Oncology

## 2023-10-31 ENCOUNTER — Other Ambulatory Visit: Payer: Self-pay

## 2023-10-31 ENCOUNTER — Inpatient Hospital Stay

## 2023-10-31 VITALS — BP 156/75 | HR 79

## 2023-10-31 VITALS — BP 133/75 | HR 79 | Temp 98.2°F | Resp 18 | Wt 157.0 lb

## 2023-10-31 DIAGNOSIS — D509 Iron deficiency anemia, unspecified: Secondary | ICD-10-CM

## 2023-10-31 DIAGNOSIS — F330 Major depressive disorder, recurrent, mild: Secondary | ICD-10-CM

## 2023-10-31 DIAGNOSIS — Z5111 Encounter for antineoplastic chemotherapy: Secondary | ICD-10-CM | POA: Insufficient documentation

## 2023-10-31 DIAGNOSIS — R634 Abnormal weight loss: Secondary | ICD-10-CM | POA: Diagnosis not present

## 2023-10-31 DIAGNOSIS — Z79633 Long term (current) use of mitotic inhibitor: Secondary | ICD-10-CM | POA: Insufficient documentation

## 2023-10-31 DIAGNOSIS — Z79630 Long term (current) use of alkylating agent: Secondary | ICD-10-CM | POA: Insufficient documentation

## 2023-10-31 DIAGNOSIS — G620 Drug-induced polyneuropathy: Secondary | ICD-10-CM

## 2023-10-31 DIAGNOSIS — C563 Malignant neoplasm of bilateral ovaries: Secondary | ICD-10-CM

## 2023-10-31 DIAGNOSIS — C786 Secondary malignant neoplasm of retroperitoneum and peritoneum: Secondary | ICD-10-CM | POA: Diagnosis not present

## 2023-10-31 DIAGNOSIS — T451X5A Adverse effect of antineoplastic and immunosuppressive drugs, initial encounter: Secondary | ICD-10-CM

## 2023-10-31 DIAGNOSIS — C569 Malignant neoplasm of unspecified ovary: Secondary | ICD-10-CM | POA: Insufficient documentation

## 2023-10-31 DIAGNOSIS — N1831 Chronic kidney disease, stage 3a: Secondary | ICD-10-CM

## 2023-10-31 LAB — CBC WITH DIFFERENTIAL (CANCER CENTER ONLY)
Abs Immature Granulocytes: 0.06 10*3/uL (ref 0.00–0.07)
Basophils Absolute: 0 10*3/uL (ref 0.0–0.1)
Basophils Relative: 1 %
Eosinophils Absolute: 0.2 10*3/uL (ref 0.0–0.5)
Eosinophils Relative: 3 %
HCT: 33 % — ABNORMAL LOW (ref 36.0–46.0)
Hemoglobin: 10.6 g/dL — ABNORMAL LOW (ref 12.0–15.0)
Immature Granulocytes: 1 %
Lymphocytes Relative: 32 %
Lymphs Abs: 2.1 10*3/uL (ref 0.7–4.0)
MCH: 28.5 pg (ref 26.0–34.0)
MCHC: 32.1 g/dL (ref 30.0–36.0)
MCV: 88.7 fL (ref 80.0–100.0)
Monocytes Absolute: 0.6 10*3/uL (ref 0.1–1.0)
Monocytes Relative: 9 %
Neutro Abs: 3.7 10*3/uL (ref 1.7–7.7)
Neutrophils Relative %: 54 %
Platelet Count: 199 10*3/uL (ref 150–400)
RBC: 3.72 MIL/uL — ABNORMAL LOW (ref 3.87–5.11)
RDW: 18 % — ABNORMAL HIGH (ref 11.5–15.5)
WBC Count: 6.6 10*3/uL (ref 4.0–10.5)
nRBC: 0 % (ref 0.0–0.2)

## 2023-10-31 LAB — CMP (CANCER CENTER ONLY)
ALT: 32 U/L (ref 0–44)
AST: 34 U/L (ref 15–41)
Albumin: 3.2 g/dL — ABNORMAL LOW (ref 3.5–5.0)
Alkaline Phosphatase: 89 U/L (ref 38–126)
Anion gap: 8 (ref 5–15)
BUN: 28 mg/dL — ABNORMAL HIGH (ref 8–23)
CO2: 24 mmol/L (ref 22–32)
Calcium: 9.1 mg/dL (ref 8.9–10.3)
Chloride: 106 mmol/L (ref 98–111)
Creatinine: 1.13 mg/dL — ABNORMAL HIGH (ref 0.44–1.00)
GFR, Estimated: 52 mL/min — ABNORMAL LOW (ref >60)
Glucose, Bld: 129 mg/dL — ABNORMAL HIGH (ref 70–99)
Potassium: 4.7 mmol/L (ref 3.5–5.1)
Sodium: 138 mmol/L (ref 135–145)
Total Bilirubin: 0.5 mg/dL (ref 0.0–1.2)
Total Protein: 6.5 g/dL (ref 6.5–8.1)

## 2023-10-31 LAB — RETIC PANEL
Immature Retic Fract: 13.3 % (ref 2.3–15.9)
RBC.: 3.78 MIL/uL — ABNORMAL LOW (ref 3.87–5.11)
Retic Count, Absolute: 51 10*3/uL (ref 19.0–186.0)
Retic Ct Pct: 1.4 % (ref 0.4–3.1)
Reticulocyte Hemoglobin: 33.5 pg (ref >27.9)

## 2023-10-31 LAB — IRON AND TIBC
Iron: 62 ug/dL (ref 28–170)
Saturation Ratios: 17 % (ref 10.4–31.8)
TIBC: 358 ug/dL (ref 250–450)
UIBC: 296 ug/dL

## 2023-10-31 LAB — FERRITIN: Ferritin: 344 ng/mL — ABNORMAL HIGH (ref 11–307)

## 2023-10-31 MED ORDER — DEXAMETHASONE SODIUM PHOSPHATE 10 MG/ML IJ SOLN
10.0000 mg | Freq: Once | INTRAMUSCULAR | Status: AC
  Administered 2023-10-31: 10 mg via INTRAVENOUS
  Filled 2023-10-31: qty 1

## 2023-10-31 MED ORDER — DIPHENHYDRAMINE HCL 50 MG/ML IJ SOLN
25.0000 mg | Freq: Once | INTRAMUSCULAR | Status: AC
  Administered 2023-10-31: 25 mg via INTRAVENOUS
  Filled 2023-10-31: qty 1

## 2023-10-31 MED ORDER — SODIUM CHLORIDE 0.9 % IV SOLN
383.0000 mg | Freq: Once | INTRAVENOUS | Status: AC
  Administered 2023-10-31: 380 mg via INTRAVENOUS
  Filled 2023-10-31: qty 37.24

## 2023-10-31 MED ORDER — SODIUM CHLORIDE 0.9 % IV SOLN
135.0000 mg/m2 | Freq: Once | INTRAVENOUS | Status: AC
  Administered 2023-10-31: 240 mg via INTRAVENOUS
  Filled 2023-10-31: qty 40

## 2023-10-31 MED ORDER — FAMOTIDINE IN NACL 20-0.9 MG/50ML-% IV SOLN
20.0000 mg | Freq: Once | INTRAVENOUS | Status: AC
  Administered 2023-10-31: 20 mg via INTRAVENOUS
  Filled 2023-10-31: qty 50

## 2023-10-31 MED ORDER — SODIUM CHLORIDE 0.9 % IV SOLN
INTRAVENOUS | Status: DC
  Filled 2023-10-31 (×2): qty 250

## 2023-10-31 MED ORDER — PALONOSETRON HCL INJECTION 0.25 MG/5ML
0.2500 mg | Freq: Once | INTRAVENOUS | Status: AC
  Administered 2023-10-31: 0.25 mg via INTRAVENOUS
  Filled 2023-10-31: qty 5

## 2023-10-31 MED ORDER — APREPITANT 130 MG/18ML IV EMUL
130.0000 mg | Freq: Once | INTRAVENOUS | Status: AC
  Administered 2023-10-31: 130 mg via INTRAVENOUS
  Filled 2023-10-31: qty 18

## 2023-10-31 NOTE — Assessment & Plan Note (Signed)
 Grade 1/2  Monitor.

## 2023-10-31 NOTE — Assessment & Plan Note (Addendum)
 Image findings and pathology results were reviewed and discussed with patient. 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  Normal CEA.  Elevated CA125 609, elevated CA 27.29 -415  Recommended neoadjuvant chemotherapy with carboplatin /Taxol  +/- Bevacizumab for 3-6 cycles followed by reimaging, followed by possible debulking surgery. HRD positive   Labs are reviewed and discussed with patient. Status post cycle 1 Carboplatin  AUC 6 with Paclitaxel  175mg /m2 patient did not tolerate well. Recommend to dose decrease to carboplatin  AUC 5, Paclitaxel  135mg /m2  Gigi risk score is 3, high risk of thrombosis.  Recommend Eliquis  2.5 mg twice daily for prophylaxis.

## 2023-10-31 NOTE — Assessment & Plan Note (Signed)
 Continue Celexa 40 mg daily.

## 2023-10-31 NOTE — Assessment & Plan Note (Signed)
 Chemotherapy treatments as listed above.

## 2023-10-31 NOTE — Assessment & Plan Note (Signed)
 Labs are reviewed and discussed with patient. Lab Results  Component Value Date   HGB 10.6 (L) 10/31/2023   TIBC 358 10/31/2023   IRONPCTSAT 17 10/31/2023   FERRITIN 344 (H) 10/31/2023    Iron  panel has improved, hold off additional Venofer 

## 2023-10-31 NOTE — Progress Notes (Signed)
 Hematology/Oncology Progress note [phone] [phone]    CHIEF COMPLAINTS/REASON FOR VISIT:  High-grade serous carcinoma of ovaries. peritoneal carcinomatosis, liver lesion.   ASSESSMENT & PLAN:    Cancer Staging  Ovarian cancer [facility]) Staging form: Ovary, Fallopian Tube, and Primary Peritoneal Carcinoma, AJCC 8th Edition - Clinical stage from 09/25/2023: FIGO Stage IV (cT3c, cN1b, cM1) - Signed by Babara Call, MD on 10/05/2023   Ovarian cancer [facility]) Image findings and pathology results were reviewed and discussed with patient. 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  Normal CEA.  Elevated CA125 609, elevated CA 27.29 -415  Recommended neoadjuvant chemotherapy with carboplatin /Taxol  +/- Bevacizumab for 3-6 cycles followed by reimaging, followed by possible debulking surgery. HRD positive   Labs are reviewed and discussed with patient. Status post cycle 1 Carboplatin  AUC 6 with Paclitaxel  175mg /m2 patient did not tolerate well. Recommend to dose decrease to carboplatin  AUC 5, Paclitaxel  135mg /m2  Gigi risk score is 3, high risk of thrombosis.  Recommend Eliquis  2.5 mg twice daily for prophylaxis.    Weight loss Likely secondary to decreased oral intake due to decreased appetite, depression. Follow-up with nutritionist  S/p dexamethasone  4 mg twice daily for 4 days. Improved appetite and weight   Iron  deficiency anemia Labs are reviewed and discussed with patient. Lab Results  Component Value Date   HGB 10.6 (L) 10/31/2023   TIBC 358 10/31/2023   IRONPCTSAT 17 10/31/2023   FERRITIN 344 (H) 10/31/2023    Iron  panel has improved, hold off additional Venofer    Encounter for antineoplastic chemotherapy Chemotherapy treatments as listed above.   Depression Continue Celexa  40 mg daily.   CKD (chronic kidney disease) stage 3, GFR 30-59 ml/min  (HCC) Encouraged oral hydration, avoid nephrotoxins.   Chemotherapy-induced neuropathy (HCC) Grade 1/2  Monitor.   Orders Placed This Encounter  Procedures   CA 125    Standing Status:   Future    Expected Date:   11/21/2023    Expiration Date:   11/20/2024   Ferritin    Standing Status:   Future    Number of Occurrences:   1    Expected Date:   10/31/2023    Expiration Date:   01/29/2024   Retic Panel    Standing Status:   Future    Number of Occurrences:   1    Expected Date:   10/31/2023    Expiration Date:   01/29/2024   Iron  and TIBC    Standing Status:   Future    Number of Occurrences:   1    Expected Date:   10/31/2023    Expiration Date:   01/29/2024   Follow-up 3 weeks All questions were answered. The patient knows to call the clinic with any problems, questions or concerns.  Call Babara, MD, PhD [facility] Hematology Oncology 10/31/2023   HISTORY OF PRESENTING ILLNESS:   Beth Hart is a  73 y.o.  female with PMH listed below presents for follow up of stage IV ovarian high grade serous carcinoma.  Oncology History  Peritoneal carcinomatosis (HCC)  09/18/2023 Initial Diagnosis   Peritoneal carcinomatosis (HCC)   10/10/2023 -  Chemotherapy   Patient is on Treatment Plan : OVARIAN Carboplatin  (AUC 6) + Paclitaxel  (175) q21d X 6 Cycles      Genetic Testing   Negative genetic testing. No pathogenic variants identified on the [employer] CancerNext+RNA panel. The report date is 10/20/2023.  The [employer]  CancerNext+RNAinsight Panel includes sequencing, rearrangement analysis, and RNA analysis for the following 40 genes: APC, ATM, BAP1, BARD1, BMPR1A, BRCA1, BRCA2, BRIP1, CDH1, CDKN2A, CHEK2, FH, FLCN, MET, MLH1, MSH2, MSH6, MUTYH, NF1, NTHL1, PALB2, PMS2, PTEN, RAD51C, RAD51D, RPS20, SMAD4, STK11, TP53, TSC1, TSC2, and VHL (sequencing and deletion/duplication); AXIN2, HOXB13, MBD4, MSH3, POLD1 and POLE (sequencing only); EPCAM and GREM1 (deletion/duplication only).   Ovarian  cancer (HCC)  09/25/2023 Cancer Staging   Staging form: Ovary, Fallopian Tube, and Primary Peritoneal Carcinoma, AJCC 8th Edition - Clinical stage from 09/25/2023: FIGO Stage IV (cT3c, cN1b, cM1) - Signed by Babara Call, MD on 10/05/2023 Stage prefix: Initial diagnosis   10/04/2023 Initial Diagnosis   Ovarian cancer  She experiences pressure and sharp pain in her abdomen, initially attributing it to a pulled muscle from yard work. The pain persisted and was accompanied by a fever, prompting her to visit the ER on 09/16/2023. The fever was around 100F at home and remained at that level upon arrival at the ER. She has a persistent cough, especially when lying down.   09/16/2023, CT abdomen pelvis with contrast showed extensive omental and peritoneal carcinomatosis with small to moderate ascites. multiple hypoattenuating masses in the liver and spleen, compatible with metastases. There are also multiple mildly enlarged retroperitoneal lymph nodes, also highly concerning for metastases.  09/24/2023, cytology from peritoneal ascites showed negative for malignancy. 09/25/2023, endometrial biopsy showed no malignancy. 09/26/2023 Patient underwent peritoneal mass biopsy,  1. Peritoneum, biopsy, RUQ mass :       - METASTATIC HIGH-GRADE SEROUS CARCINOMA WITH NECROSIS, SEE NOTE   Diagnosis Note : Immunohistochemical stains show that the tumor cells are positive for PAX8, p16 and p53 (clonal overexpression pattern) while they are negative for p63 and CK5/6.  This immunoprofile is consistent with above interpretation.       10/10/2023 -  Chemotherapy   Patient is on Treatment Plan : OVARIAN Carboplatin  (AUC 6) + Paclitaxel  (175) q21d X 6 Cycles      Imaging   CT chest w contrast  Several scattered sub 5 mm bilateral lung nodules identified. Based on appearance and other findings these are worrisome for early lung metastases. Few enlarged pre cardiophrenic mediastinal nodes as well as left internal mammary chain  lymph nodes also worrisome for spread of disease.Tiny left pleural effusion.   Please correlate with previous abdomen pelvis CT describing abdominal findings.      Genetic Testing   Negative genetic testing. No pathogenic variants identified on the [employer] CancerNext+RNA panel. The report date is 10/20/2023.  The [employer] CancerNext+RNAinsight Panel includes sequencing, rearrangement analysis, and RNA analysis for the following 40 genes: APC, ATM, BAP1, BARD1, BMPR1A, BRCA1, BRCA2, BRIP1, CDH1, CDKN2A, CHEK2, FH, FLCN, MET, MLH1, MSH2, MSH6, MUTYH, NF1, NTHL1, PALB2, PMS2, PTEN, RAD51C, RAD51D, RPS20, SMAD4, STK11, TP53, TSC1, TSC2, and VHL (sequencing and deletion/duplication); AXIN2, HOXB13, MBD4, MSH3, POLD1 and POLE (sequencing only); EPCAM and GREM1 (deletion/duplication only).    Her family history includes father who had lung cancer attributed to smoking, but no history of colon, ovarian, or breast cancer.  She underwent a colonoscopy a few years ago, which was reported to be negative.  And a mammogram six months ago, which was negative she does not recall any recent stomach pain, dysphagia, or acid reflux.  INTERVAL HISTORY ALFRED ECKLEY is a 73 y.o. female who has above history reviewed by me today presents for follow up visit for Stage IV ovarian cancer.  She has felted better since last  visit. Steroid helped her appetite and she has gained weight.  Fatigue has also improved. No new complaints.   MEDICAL HISTORY:  Past Medical History:  Diagnosis Date   Anemia    Arthritis    Asthma    as a child   Chicken pox    Colon polyps 2012   Depression    Hemorrhoids     SURGICAL HISTORY: Past Surgical History:  Procedure Laterality Date   BREAST CYST ASPIRATION Left 90s   BREAST SURGERY  2007   aspiration   CARPAL TUNNEL RELEASE  2010   REFRACTIVE SURGERY     TONSILLECTOMY      SOCIAL HISTORY: Social History   Socioeconomic History   Marital status: Married    Spouse  name: Not on file   Number of children: Not on file   Years of education: Not on file   Highest education level: Not on file  Occupational History   Not on file  Tobacco Use   Smoking status: Never   Smokeless tobacco: Never  Vaping Use   Vaping status: Never Used  Substance and Sexual Activity   Alcohol use: No   Drug use: No   Sexual activity: Yes  Other Topics Concern   Not on file  Social History Narrative   married   Social Drivers of Corporate investment banker Strain: Low Risk  (06/03/2023)   Overall Financial Resource Strain (CARDIA)    Difficulty of Paying Living Expenses: Not hard at all  Food Insecurity: No Food Insecurity (09/25/2023)   Hunger Vital Sign    Worried About Running Out of Food in the Last Year: Never true    Ran Out of Food in the Last Year: Never true  Transportation Needs: No Transportation Needs (09/25/2023)   PRAPARE - Administrator, Civil Service (Medical): No    Lack of Transportation (Non-Medical): No  Physical Activity: Insufficiently Active (06/03/2023)   Exercise Vital Sign    Days of Exercise per Week: 3 days    Minutes of Exercise per Session: 10 min  Stress: No Stress Concern Present (06/03/2023)   [employer] of Occupational Health - Occupational Stress Questionnaire    Feeling of Stress : Not at all  Social Connections: Socially Integrated (06/03/2023)   Social Connection and Isolation Panel    Frequency of Communication with Friends and Family: More than three times a week    Frequency of Social Gatherings with Friends and Family: Three times a week    Attends Religious Services: More than 4 times per year    Active Member of Clubs or Organizations: Yes    Attends Banker Meetings: More than 4 times per year    Marital Status: Married  Catering manager Violence: Not At Risk (09/25/2023)   Humiliation, Afraid, Rape, and Kick questionnaire    Fear of Current or Ex-Partner: No    Emotionally Abused: No     Physically Abused: No    Sexually Abused: No    FAMILY HISTORY: Family History  Problem Relation Age of Onset   Alzheimer's disease Mother    Lung cancer Father    Prostate cancer Maternal Uncle    Prostate cancer Maternal Uncle    Prostate cancer Maternal Uncle    Prostate cancer Maternal Uncle    Prostate cancer Maternal Uncle    Prostate cancer Other        in mat great uncles, unk#   Prostate cancer Cousin  in 4-5 mat  first cousins   Breast cancer Neg Hx     ALLERGIES:  has no known allergies.  MEDICATIONS:  Current Outpatient Medications  Medication Sig Dispense Refill   apixaban  (ELIQUIS ) 2.5 MG TABS tablet Take 1 tablet (2.5 mg total) by mouth 2 (two) times daily. 60 tablet 0   Cholecalciferol (VITAMIN D-3) 125 MCG (5000 UT) TABS Take by mouth.     citalopram  (CELEXA ) 40 MG tablet Take 1 tablet (40 mg total) by mouth daily. 30 tablet 3   dexamethasone  (DECADRON ) 4 MG tablet Take 2 tablets (8mg ) by mouth daily starting the day after carboplatin  for 3 days. Take with food 30 tablet 1   lactulose  (CHRONULAC ) 10 GM/15ML solution take15 to 30 ml 3 times a day when needed for constipation 236 mL 0   Multiple Vitamin (MULTIVITAMIN) tablet Take 1 tablet by mouth daily.     nystatin  (MYCOSTATIN ) 100000 UNIT/ML suspension Take 5 mLs (500,000 Units total) by mouth 4 (four) times daily. Swish and swallow 60 mL 0   omeprazole (PRILOSEC) 20 MG capsule Take 1 capsule (20 mg total) by mouth daily. 30 capsule 0   ondansetron  (ZOFRAN ) 8 MG tablet Take 1 tablet (8 mg total) by mouth every 8 (eight) hours as needed for nausea or vomiting. Start on the third day after carboplatin . 30 tablet 1   oxyCODONE (OXY IR/ROXICODONE) 5 MG immediate release tablet Take 1 tablet (5 mg total) by mouth every 8 (eight) hours as needed for severe pain (pain score 7-10). 30 tablet 0   polyethylene glycol powder (GLYCOLAX/MIRALAX) 17 GM/SCOOP powder Take by mouth once.     senna (SENOKOT) 8.6 MG TABS  tablet Take 1 tablet (8.6 mg total) by mouth daily as needed for mild constipation. 120 tablet 0   Calcium Carb-Cholecalciferol (CALCIUM 1000 + D PO) Take by mouth. (Patient not taking: Reported on 10/31/2023)     prochlorperazine  (COMPAZINE ) 10 MG tablet Take 1 tablet (10 mg total) by mouth every 6 (six) hours as needed for nausea or vomiting. (Patient not taking: Reported on 10/31/2023) 30 tablet 1   No current facility-administered medications for this visit.   Facility-Administered Medications Ordered in Other Visits  Medication Dose Route Frequency Provider Last Rate Last Admin   0.9 %  sodium chloride  infusion   Intravenous Continuous Babara Call, MD 10 mL/hr at 10/31/23 0915 New Bag at 10/31/23 0915   CARBOplatin  (PARAPLATIN ) 380 mg in sodium chloride  0.9 % 100 mL chemo infusion  380 mg Intravenous Once Babara Call, MD       heparin  lock flush 100 unit/mL  500 Units Intravenous Once Babara Call, MD       PACLitaxel  (TAXOL ) 240 mg in sodium chloride  0.9 % 250 mL chemo infusion (> 80mg /m2)  135 mg/m2 (Treatment Plan Recorded) Intravenous Once Babara Call, MD 97 mL/hr at 10/31/23 1038 240 mg at 10/31/23 1038    Review of Systems  Constitutional:  Positive for fatigue. Negative for appetite change, chills, fever and unexpected weight change.  HENT:   Negative for hearing loss and voice change.   Eyes:  Negative for eye problems.  Respiratory:  Negative for chest tightness and cough.   Cardiovascular:  Negative for chest pain.  Gastrointestinal:  Negative for abdominal distention, abdominal pain, blood in stool, nausea and vomiting.  Endocrine: Negative for hot flashes.  Genitourinary:  Negative for difficulty urinating and frequency.   Musculoskeletal:  Negative for arthralgias.  Skin:  Negative for itching and rash.  Neurological:  Negative for extremity weakness.  Hematological:  Negative for adenopathy.  Psychiatric/Behavioral:  Positive for depression and sleep disturbance. Negative for confusion.     PHYSICAL EXAMINATION: ECOG PERFORMANCE STATUS: 1 - Symptomatic but completely ambulatory Vitals:   10/31/23 0834  BP: 133/75  Pulse: 79  Resp: 18  Temp: 98.2 F (36.8 C)  SpO2: 97%   Filed Weights   10/31/23 0834  Weight: 157 lb (71.2 kg)    Physical Exam Constitutional:      General: She is not in acute distress. HENT:     Head: Normocephalic and atraumatic.  Eyes:     General: No scleral icterus. Cardiovascular:     Rate and Rhythm: Normal rate and regular rhythm.     Heart sounds: Normal heart sounds.  Pulmonary:     Effort: Pulmonary effort is normal. No respiratory distress.     Breath sounds: No wheezing.  Abdominal:     General: Bowel sounds are normal. There is no distension.     Palpations: Abdomen is soft.  Musculoskeletal:        General: No deformity. Normal range of motion.     Cervical back: Normal range of motion and neck supple.  Skin:    General: Skin is warm and dry.     Findings: No erythema or rash.  Neurological:     Mental Status: She is alert and oriented to person, place, and time. Mental status is at baseline.  Psychiatric:        Mood and Affect: Mood normal.     LABORATORY DATA:  I have reviewed the data as listed    Latest Ref Rng & Units 10/31/2023    8:05 AM 10/17/2023   10:47 AM 10/14/2023    1:01 PM  CBC  WBC 4.0 - 10.5 K/uL 6.6  7.3  7.0   Hemoglobin 12.0 - 15.0 g/dL 89.3  89.4  89.2   Hematocrit 36.0 - 46.0 % 33.0  32.5  33.0   Platelets 150 - 400 K/uL 199  475  498       Latest Ref Rng & Units 10/31/2023    8:05 AM 10/17/2023   10:47 AM 10/14/2023    1:00 PM  CMP  Glucose 70 - 99 mg/dL 870  858  99   BUN 8 - 23 mg/dL 28  27  39   Creatinine 0.44 - 1.00 mg/dL 8.86  8.79  8.79   Sodium 135 - 145 mmol/L 138  134  136   Potassium 3.5 - 5.1 mmol/L 4.7  4.5  4.9   Chloride 98 - 111 mmol/L 106  103  103   CO2 22 - 32 mmol/L 24  20  24    Calcium 8.9 - 10.3 mg/dL 9.1  9.4  8.7   Total Protein 6.5 - 8.1 g/dL 6.5  7.3  6.7    Total Bilirubin 0.0 - 1.2 mg/dL 0.5  0.7  0.5   Alkaline Phos 38 - 126 U/L 89  93  94   AST 15 - 41 U/L 34  26  30   ALT 0 - 44 U/L 32  23  22       RADIOGRAPHIC STUDIES: I have personally reviewed the radiological images as listed and agreed with the findings in the report. CT Chest W Contrast Result Date: 10/09/2023 CLINICAL DATA:  Peritoneal carcinomatosis. Staging. * Tracking Code: BO * EXAM: CT CHEST WITH CONTRAST TECHNIQUE: Multidetector CT imaging of the chest was performed  during intravenous contrast administration. RADIATION DOSE REDUCTION: This exam was performed according to the departmental dose-optimization program which includes automated exposure control, adjustment of the mA and/or kV according to patient size and/or use of iterative reconstruction technique. CONTRAST:  60mL OMNIPAQUE  IOHEXOL  300 MG/ML  SOLN COMPARISON:  Abdomen pelvis CT 09/16/2023. FINDINGS: Cardiovascular: Heart is nonenlarged. Trace pericardial fluid or thickening. The thoracic aorta is normal course and caliber. Mild atherosclerotic changes. Minimal coronary calcifications are seen. Mediastinum/Nodes: Preserved thyroid  gland. The thoracic esophagus has a normal course and caliber. No specific abnormal lymph node enlargement identified in the axillary region or hila. There are a few left internal mammary chain nodes identified such as image 50 and 63. The larger focus on image 63 measures 6 x 11 mm, abnormal for a node in this location. Small pre cardiophrenic nodes identified such as image 95 on the right side and image 102. Lesion on image 102 measures 15 x 7 mm. Abnormal for node in this location. Small subcarinal and paraesophageal nodes identified, less than a cm in short axis. Lungs/Pleura: There is some linear opacity lung bases likely scar or atelectasis. No consolidation, pneumothorax. Trace left-sided pleural fluid. There are several tiny lung nodules identified. Example right lower lobe measures 4 mm on  series 4, image 76. Example right lower lobe image 65 measures 4 mm. Slightly larger focus superior segment right lower lobe on image 51 of series 4 measuring 5 mm. There are a few on the left side as well. Gallbladder calcified but the vast majority are noncalcified. Up to 20 nodules are seen in the right lung and and 15 on the left. Although these are small, with the abdominal findings are worrisome for early lung metastases. Upper Abdomen: Ascites identified liver and splenic masses. Gallstones. Please see recent abdomen and pelvis CT scan. Musculoskeletal: Scattered degenerative changes along the spine. Partial fusion of the vertebral bodies at T3 and T4 which trace listhesis of T3 on T4. Findings are likely congenital. IMPRESSION: Several scattered sub 5 mm bilateral lung nodules identified. Based on appearance and other findings these are worrisome for early lung metastases. Few enlarged pre cardiophrenic mediastinal nodes as well as left internal mammary chain lymph nodes also worrisome for spread of disease. Tiny left pleural effusion. Please correlate with previous abdomen pelvis CT describing abdominal findings. Aortic Atherosclerosis (ICD10-I70.0). Electronically Signed   By: Ranell Bring M.D.   On: 10/09/2023 14:23   CT ABDOMINAL MASS BIOPSY Result Date: 09/26/2023 INDICATION: 73 year old female with peritoneal carcinomatosis, presents for biopsy EXAM: CT BIOPSY MEDICATIONS: None. ANESTHESIA/SEDATION: Moderate (conscious) sedation was employed during this procedure. A total of Versed  1.0 mg and Fentanyl  50 mcg was administered intravenously. Moderate Sedation Time: 20 minutes. The patient's level of consciousness and vital signs were monitored continuously by radiology nursing throughout the procedure under my direct supervision. FLUOROSCOPY TIME:  CT COMPLICATIONS: None PROCEDURE: Informed written consent was obtained from the patient after a thorough discussion of the procedural risks, benefits and  alternatives. All questions were addressed. Maximal Sterile Barrier Technique was utilized including caps, mask, sterile gowns, sterile gloves, sterile drape, hand hygiene and skin antiseptic. A timeout was performed prior to the initiation of the procedure. Patient positioned supine on the CT gantry table. Scout CT acquired for planning purposes. The patient is prepped and draped in the usual sterile fashion. 1% lidocaine  was used for local anesthesia. Using CT guidance, introducer needle was advanced into the targeted mass in the left upper quadrant from an anterior  approach. Once we confirmed needle tip position multiple 18 gauge core biopsy were acquired. Specimen placed into formalin. Needle was removed and a final CT was performed. Patient tolerated the procedure well and remained hemodynamically stable throughout. No complications were encountered and no significant blood loss. IMPRESSION: Status post CT-guided biopsy of left upper quadrant peritoneal mass. Signed, Ami RAMAN. Alona ROSALEA GRAVER, RPVI [facility] Electronically Signed   By: Ami Alona D.O.   On: 09/26/2023 12:11   US  Paracentesis Result Date: 09/24/2023 INDICATION: 73 year old female. History of peritoneal carcinomatosis with recurrent ascites. Request is for therapeutic and diagnostic paracentesis EXAM: ULTRASOUND GUIDED THERAPEUTIC AND DIAGNOSTIC LEFT-SIDED PARACENTESIS MEDICATIONS: lidocaine  1% 10 mL COMPLICATIONS: None immediate. PROCEDURE: Informed written consent was obtained from the patient after a discussion of the risks, benefits and alternatives to treatment. A timeout was performed prior to the initiation of the procedure. Initial ultrasound scanning demonstrates a small amount of ascites within the left lower abdominal quadrant. The left lower abdomen was prepped and draped in the usual sterile fashion. 1% lidocaine  was used for local anesthesia. Following this, a 19  gauge, 7-cm, Yueh catheter was introduced. An ultrasound image was saved for documentation purposes. The paracentesis was performed. The catheter was removed and a dressing was applied. The patient tolerated the procedure well without immediate post procedural complication. FINDINGS: A total of approximately 2.5 L of straw-colored fluid was removed. Samples were sent to the laboratory as requested by the clinical team. IMPRESSION: Successful ultrasound-guided therapeutic and diagnostic left-sided paracentesis yielding 2 point liters of peritoneal fluid. Performed by Delon Beagle NP Electronically Signed   By: Wilkie Lent M.D.   On: 09/24/2023 12:44   CT ABDOMEN PELVIS W CONTRAST Result Date: 09/16/2023 CLINICAL DATA:  Abdominal pain, acute, nonlocalized. * Tracking Code: BO * EXAM: CT ABDOMEN AND PELVIS WITH CONTRAST TECHNIQUE: Multidetector CT imaging of the abdomen and pelvis was performed using the standard protocol following bolus administration of intravenous contrast. RADIATION DOSE REDUCTION: This exam was performed according to the departmental dose-optimization program which includes automated exposure control, adjustment of the mA and/or kV according to patient size and/or use of iterative reconstruction technique. CONTRAST:  OMNIPAQUE  IOHEXOL  300 MG/ML  SOLN COMPARISON:  None Available. FINDINGS: Lower chest: There are at least 3, sub 4 mm, solid noncalcified nodules in the visualized bilateral lungs with largest measuring up to 3.4 x 4.0 mm in the right lung lower lobe (series 4, image 1), partially seen. Please see follow-up recommendations below. There are patchy atelectatic changes in the visualized lung bases. No overt consolidation. No pleural effusion. The heart is normal in size. No pericardial effusion. Hepatobiliary: The liver is normal in size. Non-cirrhotic configuration. There are at least 3, hypoattenuating masses in the right hepatic lobe with largest in the segment 6,  subcapsular location measuring 3.5 x 4.5 cm, compatible with metastases. There also ill-defined is similar characteristic hypoattenuating areas surrounding the liver along the region of caudate lobe, highly concerning for serosal implants. There is an additional 2.1 x 2.2 cm hypoattenuating lesion in the left hepatic lobe, segments 2/4 a, which may represent a cyst. No intrahepatic or extrahepatic bile duct dilation. Small volume dependent calcified gallstones noted without imaging signs of acute cholecystitis. There is focal wall thickening near the fundus of the gallbladder, most likely secondary to underlying adenomyomatosis. Normal gallbladder wall thickness. No pericholecystic inflammatory changes. Pancreas: Small/atrophic pancreas. No discrete suspicious mass noted. Main pancreatic duct is not dilated.  No peripancreatic fat stranding. Spleen: Normal size spleen. There are multiple hypoattenuating masses with largest along the lateral subcapsular area measuring 2.0 x 3.2 cm, highly concerning for metastases. Adrenals/Urinary Tract: Adrenal glands are unremarkable. Bilateral kidneys are small/atrophic and exhibits persistent fetal lobulations. No hydroureteronephrosis or nephroureterolithiasis. Unremarkable urinary bladder. Stomach/Bowel: No disproportionate dilation of the small or large bowel loops. No evidence of abnormal bowel wall thickening or inflammatory changes. The appendix was not distinctly separately visualized; however there is no acute inflammatory process in the right lower quadrant. Vascular/Lymphatic: There is small to moderate ascites. There are extensive centrally necrotic omental implants as well as multiple irregular hyperattenuating nodules along the peritoneal reflections, compatible with extensive peritoneal carcinomatosis. There multiple mildly enlarged retroperitoneal lymph nodes (for example, series 2, images 36, 41, 48, 55, etc.), with largest lymph node measuring up to 1.1 x 1.8 cm,  also highly concerning for metastases. 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 Reproductive: Limited evaluation on CT scan exam. Normal-size anteverted uterus noted. 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 There is mixed solid/cystic appearance of bilateral ovaries, which may be due to drop metastasis along the surface. However, correlate clinically and with tumor markers to determine the need for additional imaging with contrast-enhanced MRI pelvis. Other: There are tumor deposit along the undersurface of the bilateral hemidiaphragm, right more than left. There are small fat containing umbilical and right inguinal hernias. The soft tissues and abdominal wall are otherwise unremarkable. Musculoskeletal: No suspicious osseous lesions. There are mild multilevel degenerative changes in the visualized spine. IMPRESSION: 1. There are findings compatible with extensive omental and peritoneal carcinomatosis with small to moderate ascites. There are multiple hypoattenuating masses in the liver and spleen, compatible with metastases. There are also multiple mildly enlarged retroperitoneal lymph nodes, also highly concerning for metastases. 2. There is mixed solid/cystic appearance of bilateral ovaries, which may be due to drop metastasis along the surface. However, correlate clinically and with tumor markers to determine the need for additional imaging with contrast-enhanced MRI pelvis. 3. There are at least 3, sub 4 mm, solid noncalcified nodules in the visualized bilateral lungs with largest measuring up to 3.4 x 4.0 mm in the right lung lower lobe, partially seen. In the given clinical context, these are also highly concerning for metastases. Consider further evaluation with dedicated chest CT and establish a baseline. 4. Multiple other nonacute observations, as described above. Electronically Signed   By: Ree Molt M.D.   On:  09/16/2023 12:10

## 2023-10-31 NOTE — Assessment & Plan Note (Signed)
 Encouraged oral hydration, avoid nephrotoxins.

## 2023-10-31 NOTE — Patient Instructions (Signed)
 [facility] - A DEPT OF [facility]  Discharge Instructions: Thank you for choosing [facility] to provide your oncology and hematology care.  If you have a lab appointment with the Cancer Center, please go directly to the Cancer Center and check in at the registration area.  Wear comfortable clothing and clothing appropriate for easy access to any Portacath or PICC line.   We strive to give you quality time with your provider. You may need to reschedule your appointment if you arrive late (15 or more minutes).  Arriving late affects you and other patients whose appointments are after yours.  Also, if you miss three or more appointments without notifying the office, you may be dismissed from the clinic at the provider's discretion.      For prescription refill requests, have your pharmacy contact our office and allow 72 hours for refills to be completed.    Today you received the following chemotherapy and/or immunotherapy agents- taxol, carboplatin      To help prevent nausea and vomiting after your treatment, we encourage you to take your nausea medication as directed.  BELOW ARE SYMPTOMS THAT SHOULD BE REPORTED IMMEDIATELY: *FEVER GREATER THAN 100.4 F (38 C) OR HIGHER *CHILLS OR SWEATING *NAUSEA AND VOMITING THAT IS NOT CONTROLLED WITH YOUR NAUSEA MEDICATION *UNUSUAL SHORTNESS OF BREATH *UNUSUAL BRUISING OR BLEEDING *URINARY PROBLEMS (pain or burning when urinating, or frequent urination) *BOWEL PROBLEMS (unusual diarrhea, constipation, pain near the anus) TENDERNESS IN MOUTH AND THROAT WITH OR WITHOUT PRESENCE OF ULCERS (sore throat, sores in mouth, or a toothache) UNUSUAL RASH, SWELLING OR PAIN  UNUSUAL VAGINAL DISCHARGE OR ITCHING   Items with * indicate a potential emergency and should be followed up as soon as possible or go to the Emergency Department if any problems should occur.  Please show the CHEMOTHERAPY ALERT CARD or  IMMUNOTHERAPY ALERT CARD at check-in to the Emergency Department and triage nurse.  Should you have questions after your visit or need to cancel or reschedule your appointment, please contact [facility] - A DEPT OF [facility]  [phone] and follow the prompts.  Office hours are 8:00 a.m. to 4:30 p.m. Monday - Friday. Please note that voicemails left after 4:00 p.m. may not be returned until the following business day.  We are closed weekends and major holidays. You have access to a nurse at all times for urgent questions. Please call the main number to the clinic [phone] and follow the prompts.  For any non-urgent questions, you may also contact your provider using MyChart. We now offer e-Visits for anyone 36 and older to request care online for non-urgent symptoms. For details visit [email or web address].   Also download the MyChart app! Go to the app store, search "MyChart", open the app, select [place], and log in with your MyChart username and password.

## 2023-10-31 NOTE — Assessment & Plan Note (Signed)
 Likely secondary to decreased oral intake due to decreased appetite, depression. Follow-up with nutritionist  S/p dexamethasone  4 mg twice daily for 4 days. Improved appetite and weight

## 2023-11-02 LAB — CA 125: Cancer Antigen (CA) 125: 396 U/mL — ABNORMAL HIGH (ref 0.0–38.1)

## 2023-11-07 ENCOUNTER — Encounter

## 2023-11-07 ENCOUNTER — Ambulatory Visit (INDEPENDENT_AMBULATORY_CARE_PROVIDER_SITE_OTHER)

## 2023-11-07 VITALS — BP 126/78 | HR 82 | Temp 98.8°F | Ht 62.0 in | Wt 153.8 lb

## 2023-11-07 DIAGNOSIS — N1831 Chronic kidney disease, stage 3a: Secondary | ICD-10-CM

## 2023-11-07 DIAGNOSIS — F330 Major depressive disorder, recurrent, mild: Secondary | ICD-10-CM | POA: Diagnosis not present

## 2023-11-07 DIAGNOSIS — C786 Secondary malignant neoplasm of retroperitoneum and peritoneum: Secondary | ICD-10-CM | POA: Diagnosis not present

## 2023-11-07 DIAGNOSIS — E785 Hyperlipidemia, unspecified: Secondary | ICD-10-CM

## 2023-11-07 DIAGNOSIS — R531 Weakness: Secondary | ICD-10-CM

## 2023-11-07 DIAGNOSIS — R7303 Prediabetes: Secondary | ICD-10-CM

## 2023-11-07 DIAGNOSIS — C563 Malignant neoplasm of bilateral ovaries: Secondary | ICD-10-CM

## 2023-11-07 DIAGNOSIS — Z1329 Encounter for screening for other suspected endocrine disorder: Secondary | ICD-10-CM

## 2023-11-07 NOTE — Assessment & Plan Note (Signed)
 Plan per ovarian cancer.

## 2023-11-07 NOTE — Assessment & Plan Note (Addendum)
 Secondary to recent diagnosis of cancer and stress from ongoing management.  PHQ-9, GAD-7 reviewed. Taking Citalopram  40 mg daily with improvement in mood. Will continue to monitor. Recommend virtual visit in 3 months, sooner if needed. Patient also declines updating further labs, taking daily multivitamin at this time (patient preference). We discussed benefit of grief counseling today. She will reach out to us  if acute concerns arises otherwise plans on following up with heme/onc and palliative team. I will continue to monitor and recommend behavioral health referral in the future.  Does not need refill on Citalopram .

## 2023-11-07 NOTE — Assessment & Plan Note (Signed)
-   Undergoing chemotherapy with potential debulking surgery if initial treatments are ineffective. Experiencing physical and emotional challenges due to this. Improved appetite post-chemotherapy. Motivated to continue treatment for family, desires comfort if condition worsens. - Continue chemotherapy. - Coordinate with Dr. Babara and oncology team for treatment and continue follow up with palliative care. - Continue follow up with palliative care team.  - Discussed benefit of grief counseling.

## 2023-11-07 NOTE — Assessment & Plan Note (Signed)
 Renal function has been stable most recent CMP shows GFR of 52, baseline Cr 1.13 from 10/31/23. Continue with oral hydration, avoid nephrotoxic medications.

## 2023-11-07 NOTE — Progress Notes (Signed)
 Established Patient Office Visit TOC from Dr. Maribeth   Subjective  Patient ID: Rudell JONELLE Ran, female    DOB: 01-Jul-1950  Age: 73 y.o. MRN: [id number]  Chief Complaint  Patient presents with   Establish Care   She  has a past medical history of Anemia, Arthritis, Asthma, Chicken pox, Colon polyps (2012), Depression, Hemorrhoids, and Ovarian cancer (HCC).  HPI Discussed the use of AI scribe software for clinical note transcription with the patient, who gave verbal consent to proceed.  History of Present Illness Lashaun Poch is a 73 year old female presents for transfer of care from previous PCP.   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 Patient is established with palliative medicine through oncology at this time.   Patient reports she is going through stages of grief after her diagnosis. She is currently on Celexa  40 mg daily. She has a strong support system from family, and is surrounded by her faith community. She has planned her funeral and organized her affairs to ease the burden on her family even before her diagnosis of cancer. She expresses acceptance of her condition and is focused on living day-to-day, finding joy where she can. She is motivated to continue with current treatment plan.   She experiences occasional abdominal fullness and twinges, which she attributes to the chemotherapy. She denies need for any intervention from me at this time. Her family is religious and is relying on faith when going through current treatment.  ROS As per HPI    Objective:     BP 126/78 (BP Location: Right Arm, Patient Position: Sitting, Cuff Size: Normal)   Pulse 82   Temp 98.8 F (37.1 C) (Oral)   Ht 5' 2 (1.575 m)   Wt 153 lb 12.8 oz (69.8 kg)   SpO2 97%   BMI 28.13 kg/m      11/07/2023     9:45 AM 09/18/2023    3:33 PM 06/03/2023    1:53 PM  Depression screen PHQ 2/9  Decreased Interest 0 0 0  Down, Depressed, Hopeless 0 0 0  PHQ - 2 Score 0 0 0  Altered sleeping 0  0  Tired, decreased energy 3  0  Change in appetite 3  0  Feeling bad or failure about yourself  0  0  Trouble concentrating 0  0  Moving slowly or fidgety/restless 0  0  Suicidal thoughts 0  0  PHQ-9 Score 6  0  Difficult doing work/chores Not difficult at all  Not difficult at all      11/07/2023    9:46 AM 08/17/2022    8:11 AM  GAD 7 : Generalized Anxiety Score  Nervous, Anxious, on Edge 1 0  Control/stop worrying 1 0  Worry too much - different things 1 0  Trouble relaxing 1 0  Restless 1 0  Easily annoyed or irritable 1 0  Afraid - awful might happen 1 0  Total GAD 7 Score 7 0  Anxiety Difficulty Not difficult at all Not difficult at all      11/07/2023    9:45 AM 09/18/2023    3:33 PM 06/03/2023    1:53 PM  Depression screen PHQ 2/9  Decreased Interest 0 0 0  Down, Depressed, Hopeless 0 0 0  PHQ - 2 Score 0 0 0  Altered sleeping 0  0  Tired, decreased energy 3  0  Change in appetite 3  0  Feeling bad or failure about yourself  0  0  Trouble concentrating 0  0  Moving slowly or fidgety/restless 0  0  Suicidal thoughts 0  0  PHQ-9 Score 6  0  Difficult doing work/chores Not difficult at all  Not difficult at all      11/07/2023    9:46 AM 08/17/2022    8:11 AM  GAD 7 : Generalized Anxiety Score  Nervous, Anxious, on Edge 1 0  Control/stop worrying 1 0  Worry too much - different things 1 0  Trouble relaxing 1 0  Restless 1 0  Easily annoyed or irritable 1 0  Afraid - awful might happen 1 0  Total GAD 7 Score 7 0  Anxiety Difficulty Not difficult at all Not difficult at all   SDOH Screenings   Food Insecurity: No Food Insecurity (11/03/2023)  Housing: Unknown (11/03/2023)  Transportation Needs: No Transportation Needs (11/03/2023)  Utilities: Not At Risk (09/25/2023)  Alcohol  Screen: Low Risk  (06/03/2023)  Depression (PHQ2-9): Medium Risk (11/07/2023)  Financial Resource Strain: Low Risk  (11/03/2023)  Physical Activity: Inactive (11/03/2023)  Social Connections: Socially Integrated (11/03/2023)  Stress: No Stress Concern Present (11/03/2023)  Tobacco Use: Low Risk  (11/07/2023)  Health Literacy: Adequate Health Literacy (06/03/2023)     Physical Exam Constitutional:      General: She is not in acute distress. HENT:     Mouth/Throat:     Mouth: Mucous membranes are moist.     Pharynx: Oropharynx is clear.  Cardiovascular:     Rate and Rhythm: Normal rate.  Abdominal:     Tenderness: There is no guarding.  Musculoskeletal:     Cervical back: Neck supple.     Right lower leg: No edema.     Left lower leg: No edema.  Skin:    General: Skin is warm.  Neurological:     Mental Status: She is alert and oriented to person, place, and time.  Psychiatric:        Mood and Affect: Affect is tearful. Affect is not inappropriate.        No results found for any visits on 11/07/23.  The 10-year ASCVD risk score (Arnett DK, et al., 2019) is: 11.1%     Assessment & Plan:  Patient is a pleasant 73 year old female presenting to establish care. Patient closely following up with heme/onc and declines updating labs today.   Mild episode of recurrent major depressive disorder [facility]) Assessment & Plan: Secondary to recent diagnosis of cancer and stress from ongoing management.  PHQ-9, GAD-7 reviewed. Taking Citalopram  40 mg daily with improvement in mood. Will continue to monitor. Recommend virtual visit in 3 months, sooner if needed. Patient also declines updating further labs, taking daily multivitamin at this time (patient preference). We discussed benefit of grief counseling today. She will reach out to us  if acute concerns arises otherwise plans on following up with heme/onc and palliative team. I will continue to monitor and recommend behavioral health referral in the  future.  Does not need refill on Citalopram .    Malignant neoplasm of both ovaries [facility]) Assessment & Plan: - Undergoing chemotherapy with potential debulking surgery if initial treatments are ineffective. Experiencing physical and emotional challenges due to this. Improved appetite post-chemotherapy. Motivated to continue treatment for family, desires comfort if condition worsens. - Continue chemotherapy. - Coordinate with Dr. Babara and oncology  team for treatment and continue follow up with palliative care. - Continue follow up with palliative care team.  - Discussed benefit of grief counseling.    Peritoneal carcinomatosis [facility]) Assessment & Plan: Plan per ovarian cancer.    Stage 3a chronic kidney disease (HCC) Assessment & Plan: Renal function has been stable most recent CMP shows GFR of 52, baseline Cr 1.13 from 10/31/23. Continue with oral hydration, avoid nephrotoxic medications.      I spent 30 minutes on the day of this face-to-face encounter reviewing the patient's medical and surgical history, medications, ongoing concerns, and reviewing the assessment and plan with the patient.   Return for Virtual in 3 months for mood/depression.   Luke Shade, MD

## 2023-11-14 ENCOUNTER — Inpatient Hospital Stay (HOSPITAL_BASED_OUTPATIENT_CLINIC_OR_DEPARTMENT_OTHER): Admitting: Hospice and Palliative Medicine

## 2023-11-14 DIAGNOSIS — Z515 Encounter for palliative care: Secondary | ICD-10-CM | POA: Diagnosis not present

## 2023-11-14 DIAGNOSIS — C563 Malignant neoplasm of bilateral ovaries: Secondary | ICD-10-CM | POA: Diagnosis not present

## 2023-11-14 NOTE — Progress Notes (Signed)
 Virtual Visit via Telephone Note  I connected with Beth Hart on 11/14/23 at  3:40 PM EDT by telephone and verified that I am speaking with the correct person using two identifiers.  Location: Patient: Home Provider: Clinic   I discussed the limitations, risks, security and privacy concerns of performing an evaluation and management service by telephone and the availability of in person appointments. I also discussed with the patient that there may be a patient responsible charge related to this service. The patient expressed understanding and agreed to proceed.   History of Present Illness: Beth Hart is a 73 y.o. 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  Patient is referred to palliative care to address goals and manage ongoing symptoms.   Observations/Objective: I called and spoke with patient by phone.  Patient decided to pursue additional chemotherapy after poorly tolerating cycle 1.  Subsequent cycles have been better tolerated.  Patient says that she feels a bit fatigued for few days but then returns to baseline.  Lately, patient says that she has felt significant improvement.  Her performance status has returned to a similar level prior to her diagnosis.  Her appetite has improved.  Patient denies any symptomatic complaints or concerns at present.  Assessment and Plan: High-grade serous ovarian cancer -on dose reduced CarboTaxol.  Seems to be tolerating better.  No significant symptomatic issues at present.  Follow Up Instructions: Follow-up telephone visit 2 to 3 months   I discussed the assessment and treatment plan with the patient. The patient was provided an opportunity to ask questions and all were answered. The patient agreed with the plan and demonstrated an understanding of the instructions.   The patient was advised to call back or seek an in-person  evaluation if the symptoms worsen or if the condition fails to improve as anticipated.  I provided 10 minutes of non-face-to-face time during this encounter.   FONDA JONELLE MOWER, NP

## 2023-11-21 ENCOUNTER — Encounter: Payer: Self-pay | Admitting: Oncology

## 2023-11-21 ENCOUNTER — Inpatient Hospital Stay

## 2023-11-21 ENCOUNTER — Inpatient Hospital Stay: Admitting: Oncology

## 2023-11-21 VITALS — BP 152/78 | HR 82 | Temp 97.1°F | Resp 18

## 2023-11-21 VITALS — BP 121/75 | HR 81 | Temp 98.9°F | Resp 19 | Wt 156.4 lb

## 2023-11-21 DIAGNOSIS — C786 Secondary malignant neoplasm of retroperitoneum and peritoneum: Secondary | ICD-10-CM

## 2023-11-21 DIAGNOSIS — C563 Malignant neoplasm of bilateral ovaries: Secondary | ICD-10-CM

## 2023-11-21 DIAGNOSIS — Z5111 Encounter for antineoplastic chemotherapy: Secondary | ICD-10-CM | POA: Diagnosis not present

## 2023-11-21 DIAGNOSIS — N1831 Chronic kidney disease, stage 3a: Secondary | ICD-10-CM | POA: Diagnosis not present

## 2023-11-21 DIAGNOSIS — F330 Major depressive disorder, recurrent, mild: Secondary | ICD-10-CM

## 2023-11-21 DIAGNOSIS — T451X5A Adverse effect of antineoplastic and immunosuppressive drugs, initial encounter: Secondary | ICD-10-CM

## 2023-11-21 DIAGNOSIS — G620 Drug-induced polyneuropathy: Secondary | ICD-10-CM | POA: Diagnosis not present

## 2023-11-21 LAB — CBC WITH DIFFERENTIAL (CANCER CENTER ONLY)
Abs Immature Granulocytes: 0.02 K/uL (ref 0.00–0.07)
Basophils Absolute: 0 K/uL (ref 0.0–0.1)
Basophils Relative: 1 %
Eosinophils Absolute: 0.1 K/uL (ref 0.0–0.5)
Eosinophils Relative: 2 %
HCT: 32.3 % — ABNORMAL LOW (ref 36.0–46.0)
Hemoglobin: 10.3 g/dL — ABNORMAL LOW (ref 12.0–15.0)
Immature Granulocytes: 1 %
Lymphocytes Relative: 39 %
Lymphs Abs: 1.7 K/uL (ref 0.7–4.0)
MCH: 29 pg (ref 26.0–34.0)
MCHC: 31.9 g/dL (ref 30.0–36.0)
MCV: 91 fL (ref 80.0–100.0)
Monocytes Absolute: 0.5 K/uL (ref 0.1–1.0)
Monocytes Relative: 11 %
Neutro Abs: 2 K/uL (ref 1.7–7.7)
Neutrophils Relative %: 46 %
Platelet Count: 216 K/uL (ref 150–400)
RBC: 3.55 MIL/uL — ABNORMAL LOW (ref 3.87–5.11)
RDW: 20.9 % — ABNORMAL HIGH (ref 11.5–15.5)
WBC Count: 4.4 K/uL (ref 4.0–10.5)
nRBC: 0 % (ref 0.0–0.2)

## 2023-11-21 LAB — CMP (CANCER CENTER ONLY)
ALT: 27 U/L (ref 0–44)
AST: 32 U/L (ref 15–41)
Albumin: 3.6 g/dL (ref 3.5–5.0)
Alkaline Phosphatase: 84 U/L (ref 38–126)
Anion gap: 8 (ref 5–15)
BUN: 29 mg/dL — ABNORMAL HIGH (ref 8–23)
CO2: 24 mmol/L (ref 22–32)
Calcium: 9.1 mg/dL (ref 8.9–10.3)
Chloride: 104 mmol/L (ref 98–111)
Creatinine: 1.03 mg/dL — ABNORMAL HIGH (ref 0.44–1.00)
GFR, Estimated: 57 mL/min — ABNORMAL LOW (ref >60)
Glucose, Bld: 133 mg/dL — ABNORMAL HIGH (ref 70–99)
Potassium: 4.5 mmol/L (ref 3.5–5.1)
Sodium: 136 mmol/L (ref 135–145)
Total Bilirubin: 0.5 mg/dL (ref 0.0–1.2)
Total Protein: 6.6 g/dL (ref 6.5–8.1)

## 2023-11-21 MED ORDER — APREPITANT 130 MG/18ML IV EMUL
130.0000 mg | Freq: Once | INTRAVENOUS | Status: AC
  Administered 2023-11-21: 130 mg via INTRAVENOUS
  Filled 2023-11-21: qty 18

## 2023-11-21 MED ORDER — SODIUM CHLORIDE 0.9 % IV SOLN
135.0000 mg/m2 | Freq: Once | INTRAVENOUS | Status: AC
  Administered 2023-11-21: 240 mg via INTRAVENOUS
  Filled 2023-11-21: qty 40

## 2023-11-21 MED ORDER — PALONOSETRON HCL INJECTION 0.25 MG/5ML
0.2500 mg | Freq: Once | INTRAVENOUS | Status: AC
  Administered 2023-11-21: 0.25 mg via INTRAVENOUS
  Filled 2023-11-21: qty 5

## 2023-11-21 MED ORDER — FAMOTIDINE IN NACL 20-0.9 MG/50ML-% IV SOLN
20.0000 mg | Freq: Once | INTRAVENOUS | Status: AC
  Administered 2023-11-21: 20 mg via INTRAVENOUS
  Filled 2023-11-21: qty 50

## 2023-11-21 MED ORDER — SODIUM CHLORIDE 0.9 % IV SOLN
INTRAVENOUS | Status: DC
  Filled 2023-11-21: qty 250

## 2023-11-21 MED ORDER — DIPHENHYDRAMINE HCL 50 MG/ML IJ SOLN
25.0000 mg | Freq: Once | INTRAMUSCULAR | Status: AC
  Administered 2023-11-21: 25 mg via INTRAVENOUS
  Filled 2023-11-21: qty 1

## 2023-11-21 MED ORDER — DEXAMETHASONE SODIUM PHOSPHATE 10 MG/ML IJ SOLN
10.0000 mg | Freq: Once | INTRAMUSCULAR | Status: AC
  Administered 2023-11-21: 10 mg via INTRAVENOUS
  Filled 2023-11-21: qty 1

## 2023-11-21 MED ORDER — SODIUM CHLORIDE 0.9 % IV SOLN
404.0000 mg | Freq: Once | INTRAVENOUS | Status: AC
  Administered 2023-11-21: 400 mg via INTRAVENOUS
  Filled 2023-11-21: qty 40

## 2023-11-21 NOTE — Assessment & Plan Note (Addendum)
 Image findings and pathology results were reviewed and discussed with patient. 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  Normal CEA.  Elevated CA125 609, elevated CA 27.29 -415  Recommended neoadjuvant chemotherapy with carboplatin /Taxol  +/- Bevacizumab for 3-6 cycles followed by reimaging, followed by possible debulking surgery. HRD positive    She did not tolerate Carboplatin  AUC 6 with Paclitaxel  175mg /m2 x1.  On dose reduced regimen carboplatin  AUC 5, Paclitaxel  135mg /m2  Labs are reviewed and discussed with patient. Proceed with cycle 3 Obtain CT for eval of treatment response.   Gigi risk score is 3, high risk of thrombosis.  Recommend Eliquis  2.5 mg twice daily for prophylaxis.

## 2023-11-21 NOTE — Progress Notes (Signed)
 Hematology/Oncology Progress note [phone] [phone]    CHIEF COMPLAINTS/REASON FOR VISIT:  High-grade serous carcinoma of ovaries. peritoneal carcinomatosis, liver lesion.   ASSESSMENT & PLAN:    Cancer Staging  Ovarian cancer [facility]) Staging form: Ovary, Fallopian Tube, and Primary Peritoneal Carcinoma, AJCC 8th Edition - Clinical stage from 09/25/2023: FIGO Stage IV (cT3c, cN1b, cM1) - Signed by Babara Call, MD on 10/05/2023   Ovarian cancer [facility]) Image findings and pathology results were reviewed and discussed with patient. 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  Normal CEA.  Elevated CA125 609, elevated CA 27.29 -415  Recommended neoadjuvant chemotherapy with carboplatin /Taxol  +/- Bevacizumab for 3-6 cycles followed by reimaging, followed by possible debulking surgery. HRD positive    She did not tolerate Carboplatin  AUC 6 with Paclitaxel  175mg /m2 x1.  On dose reduced regimen carboplatin  AUC 5, Paclitaxel  135mg /m2  Labs are reviewed and discussed with patient. Proceed with cycle 3 Obtain CT for eval of treatment response.   Gigi risk score is 3, high risk of thrombosis.  Recommend Eliquis  2.5 mg twice daily for prophylaxis.    Chemotherapy-induced neuropathy (HCC) Grade 1/2  Monitor.   CKD (chronic kidney disease) stage 3, GFR 30-59 ml/min (HCC) Encouraged oral hydration, avoid nephrotoxins.   Encounter for antineoplastic chemotherapy Chemotherapy treatments as listed above.   Depression Continue Celexa  40 mg daily.   Orders Placed This Encounter  Procedures   CT CHEST ABDOMEN PELVIS W CONTRAST    Standing Status:   Future    Expected Date:   12/02/2023    Expiration Date:   11/20/2024    If indicated for the ordered procedure, I authorize the administration of contrast media per Radiology protocol:   Yes    Does the patient have a  contrast media/X-ray dye allergy?:   No    Preferred imaging location?:   [facility]    If indicated for the ordered procedure, I authorize the administration of oral contrast media per Radiology protocol:   Yes   CA 125    Standing Status:   Future    Expected Date:   12/12/2023    Expiration Date:   12/11/2024   CBC with Differential (Cancer Center Only)    Standing Status:   Future    Expected Date:   12/12/2023    Expiration Date:   12/11/2024   CMP (Cancer Center only)    Standing Status:   Future    Expected Date:   12/12/2023    Expiration Date:   12/11/2024   Follow-up 3 weeks All questions were answered. The patient knows to call the clinic with any problems, questions or concerns.  Call Babara, MD, PhD [facility] Hematology Oncology 11/21/2023   HISTORY OF PRESENTING ILLNESS:   Beth Hart is a  73 y.o.  female with PMH listed below presents for follow up of stage IV ovarian high grade serous carcinoma.  Oncology History  Peritoneal carcinomatosis (HCC)  09/18/2023 Initial Diagnosis   Peritoneal carcinomatosis (HCC)   10/10/2023 -  Chemotherapy   Patient is on Treatment Plan : OVARIAN Carboplatin  (AUC 6) + Paclitaxel  (175) q21d X 6 Cycles      Genetic Testing   Negative genetic testing. No pathogenic variants identified on the [facility] CancerNext+RNA panel. The report date is 10/20/2023.  The [facility] CancerNext+RNAinsight Panel includes sequencing, rearrangement analysis, and RNA analysis for the following 40 genes: APC, ATM, BAP1, BARD1, BMPR1A, BRCA1,  BRCA2, BRIP1, CDH1, CDKN2A, CHEK2, FH, FLCN, MET, MLH1, MSH2, MSH6, MUTYH, NF1, NTHL1, PALB2, PMS2, PTEN, RAD51C, RAD51D, RPS20, SMAD4, STK11, TP53, TSC1, TSC2, and VHL (sequencing and deletion/duplication); AXIN2, HOXB13, MBD4, MSH3, POLD1 and POLE (sequencing only); EPCAM and GREM1 (deletion/duplication only).   Ovarian cancer (HCC)  09/25/2023 Cancer Staging   Staging form: Ovary, Fallopian Tube, and Primary Peritoneal  Carcinoma, AJCC 8th Edition - Clinical stage from 09/25/2023: FIGO Stage IV (cT3c, cN1b, cM1) - Signed by Babara Call, MD on 10/05/2023 Stage prefix: Initial diagnosis   10/04/2023 Initial Diagnosis   Ovarian cancer  She experiences pressure and sharp pain in her abdomen, initially attributing it to a pulled muscle from yard work. The pain persisted and was accompanied by a fever, prompting her to visit the ER on 09/16/2023. The fever was around 100F at home and remained at that level upon arrival at the ER. She has a persistent cough, especially when lying down.   09/16/2023, CT abdomen pelvis with contrast showed extensive omental and peritoneal carcinomatosis with small to moderate ascites. multiple hypoattenuating masses in the liver and spleen, compatible with metastases. There are also multiple mildly enlarged retroperitoneal lymph nodes, also highly concerning for metastases.  09/24/2023, cytology from peritoneal ascites showed negative for malignancy. 09/25/2023, endometrial biopsy showed no malignancy. 09/26/2023 Patient underwent peritoneal mass biopsy,  1. Peritoneum, biopsy, RUQ mass :       - METASTATIC HIGH-GRADE SEROUS CARCINOMA WITH NECROSIS, SEE NOTE   Diagnosis Note : Immunohistochemical stains show that the tumor cells are positive for PAX8, p16 and p53 (clonal overexpression pattern) while they are negative for p63 and CK5/6.  This immunoprofile is consistent with above interpretation.       10/10/2023 -  Chemotherapy   Patient is on Treatment Plan : OVARIAN Carboplatin  (AUC 6) + Paclitaxel  (175) q21d X 6 Cycles      Imaging   CT chest w contrast  Several scattered sub 5 mm bilateral lung nodules identified. Based on appearance and other findings these are worrisome for early lung metastases. Few enlarged pre cardiophrenic mediastinal nodes as well as left internal mammary chain lymph nodes also worrisome for spread of disease.Tiny left pleural effusion.   Please correlate with  previous abdomen pelvis CT describing abdominal findings.      Genetic Testing   Negative genetic testing. No pathogenic variants identified on the [facility] CancerNext+RNA panel. The report date is 10/20/2023.  The [facility] CancerNext+RNAinsight Panel includes sequencing, rearrangement analysis, and RNA analysis for the following 40 genes: APC, ATM, BAP1, BARD1, BMPR1A, BRCA1, BRCA2, BRIP1, CDH1, CDKN2A, CHEK2, FH, FLCN, MET, MLH1, MSH2, MSH6, MUTYH, NF1, NTHL1, PALB2, PMS2, PTEN, RAD51C, RAD51D, RPS20, SMAD4, STK11, TP53, TSC1, TSC2, and VHL (sequencing and deletion/duplication); AXIN2, HOXB13, MBD4, MSH3, POLD1 and POLE (sequencing only); EPCAM and GREM1 (deletion/duplication only).    Her family history includes father who had lung cancer attributed to smoking, but no history of colon, ovarian, or breast cancer.  She underwent a colonoscopy a few years ago, which was reported to be negative.  And a mammogram six months ago, which was negative she does not recall any recent stomach pain, dysphagia, or acid reflux.  INTERVAL HISTORY Beth Hart is a 73 y.o. female who has above history reviewed by me today presents for follow up visit for Stage IV ovarian cancer.  She tolerated with cycle 2 dose reduced chemotherapy well. Some fatigue for 4-5 days after chemotherapy. And then feels better afterwards.    MEDICAL HISTORY:  Past Medical History:  Diagnosis Date   Anemia    Arthritis    Asthma    as a child   Chicken pox    Colon polyps 2012   Depression    Hemorrhoids    Ovarian cancer (HCC)     SURGICAL HISTORY: Past Surgical History:  Procedure Laterality Date   BREAST CYST ASPIRATION Left 90s   BREAST SURGERY  2007   aspiration   CARPAL TUNNEL RELEASE  2010   REFRACTIVE SURGERY     TONSILLECTOMY      SOCIAL HISTORY: Social History   Socioeconomic History   Marital status: Married    Spouse name: Not on file   Number of children: Not on file   Years of education: Not  on file   Highest education level: Associate degree: occupational, Scientist, product/process development, or vocational program  Occupational History   Not on file  Tobacco Use   Smoking status: Never   Smokeless tobacco: Never  Vaping Use   Vaping status: Never Used  Substance and Sexual Activity   Alcohol use: No   Drug use: No   Sexual activity: Yes  Other Topics Concern   Not on file  Social History Narrative   married   Social Drivers of Corporate investment banker Strain: Low Risk  (11/03/2023)   Overall Financial Resource Strain (CARDIA)    Difficulty of Paying Living Expenses: Not hard at all  Food Insecurity: No Food Insecurity (11/03/2023)   Hunger Vital Sign    Worried About Running Out of Food in the Last Year: Never true    Ran Out of Food in the Last Year: Never true  Transportation Needs: No Transportation Needs (11/03/2023)   PRAPARE - Administrator, Civil Service (Medical): No    Lack of Transportation (Non-Medical): No  Physical Activity: Inactive (11/03/2023)   Exercise Vital Sign    Days of Exercise per Week: 0 days    Minutes of Exercise per Session: Not on file  Stress: No Stress Concern Present (11/03/2023)   [employer] of Occupational Health - Occupational Stress Questionnaire    Feeling of Stress: Only a little  Social Connections: Socially Integrated (11/03/2023)   Social Connection and Isolation Panel    Frequency of Communication with Friends and Family: More than three times a week    Frequency of Social Gatherings with Friends and Family: Once a week    Attends Religious Services: More than 4 times per year    Active Member of [employer] or Organizations: Yes    Attends Engineer, structural: More than 4 times per year    Marital Status: Married  Catering manager Violence: Not At Risk (09/25/2023)   Humiliation, Afraid, Rape, and Kick questionnaire    Fear of Current or Ex-Partner: No    Emotionally Abused: No    Physically Abused: No    Sexually Abused:  No    FAMILY HISTORY: Family History  Problem Relation Age of Onset   Alzheimer's disease Mother    Lung cancer Father    Prostate cancer Maternal Uncle    Prostate cancer Maternal Uncle    Prostate cancer Maternal Uncle    Prostate cancer Maternal Uncle    Prostate cancer Maternal Uncle    Prostate cancer Other        in mat great uncles, unk#   Prostate cancer Cousin        in 4-5 mat  first cousins   Breast cancer  Neg Hx     ALLERGIES:  has no known allergies.  MEDICATIONS:  Current Outpatient Medications  Medication Sig Dispense Refill   citalopram  (CELEXA ) 40 MG tablet Take 1 tablet (40 mg total) by mouth daily. 30 tablet 3   dexamethasone  (DECADRON ) 4 MG tablet Take 2 tablets (8mg ) by mouth daily starting the day after carboplatin  for 3 days. Take with food 30 tablet 1   lactulose  (CHRONULAC ) 10 GM/15ML solution take15 to 30 ml 3 times a day when needed for constipation 236 mL 0   nystatin  (MYCOSTATIN ) 100000 UNIT/ML suspension Take 5 mLs (500,000 Units total) by mouth 4 (four) times daily. Swish and swallow 60 mL 0   omeprazole  (PRILOSEC) 20 MG capsule Take 1 capsule (20 mg total) by mouth daily. 30 capsule 0   ondansetron  (ZOFRAN ) 8 MG tablet Take 1 tablet (8 mg total) by mouth every 8 (eight) hours as needed for nausea or vomiting. Start on the third day after carboplatin . 30 tablet 1   oxyCODONE  (OXY IR/ROXICODONE ) 5 MG immediate release tablet Take 1 tablet (5 mg total) by mouth every 8 (eight) hours as needed for severe pain (pain score 7-10). 30 tablet 0   polyethylene glycol powder (GLYCOLAX/MIRALAX) 17 GM/SCOOP powder Take by mouth once.     senna (SENOKOT) 8.6 MG TABS tablet Take 1 tablet (8.6 mg total) by mouth daily as needed for mild constipation. 120 tablet 0   prochlorperazine  (COMPAZINE ) 10 MG tablet Take 1 tablet (10 mg total) by mouth every 6 (six) hours as needed for nausea or vomiting. (Patient not taking: Reported on 11/21/2023) 30 tablet 1   No current  facility-administered medications for this visit.   Facility-Administered Medications Ordered in Other Visits  Medication Dose Route Frequency Provider Last Rate Last Admin   0.9 %  sodium chloride  infusion   Intravenous Continuous Babara Call, MD   Stopped at 11/21/23 1511   heparin  lock flush 100 unit/mL  500 Units Intravenous Once Lydiah Pong, MD        Review of Systems  Constitutional:  Positive for fatigue. Negative for appetite change, chills, fever and unexpected weight change.  HENT:   Negative for hearing loss and voice change.   Eyes:  Negative for eye problems.  Respiratory:  Negative for chest tightness and cough.   Cardiovascular:  Negative for chest pain.  Gastrointestinal:  Negative for abdominal distention, abdominal pain, blood in stool, nausea and vomiting.  Endocrine: Negative for hot flashes.  Genitourinary:  Negative for difficulty urinating and frequency.   Musculoskeletal:  Negative for arthralgias.  Skin:  Negative for itching and rash.  Neurological:  Negative for extremity weakness.  Hematological:  Negative for adenopathy.  Psychiatric/Behavioral:  Positive for depression and sleep disturbance. Negative for confusion.    PHYSICAL EXAMINATION: ECOG PERFORMANCE STATUS: 1 - Symptomatic but completely ambulatory Vitals:   11/21/23 0845  BP: 121/75  Pulse: 81  Resp: 19  Temp: 98.9 F (37.2 C)  SpO2: 98%   Filed Weights   11/21/23 0845  Weight: 156 lb 6.4 oz (70.9 kg)    Physical Exam Constitutional:      General: She is not in acute distress. HENT:     Head: Normocephalic and atraumatic.  Eyes:     General: No scleral icterus. Cardiovascular:     Rate and Rhythm: Normal rate and regular rhythm.     Heart sounds: Normal heart sounds.  Pulmonary:     Effort: Pulmonary effort is normal. No respiratory distress.  Breath sounds: No wheezing.  Abdominal:     General: Bowel sounds are normal. There is no distension.     Palpations: Abdomen is soft.   Musculoskeletal:        General: No deformity. Normal range of motion.     Cervical back: Normal range of motion and neck supple.  Skin:    General: Skin is warm and dry.     Findings: No erythema or rash.  Neurological:     Mental Status: She is alert and oriented to person, place, and time. Mental status is at baseline.  Psychiatric:        Mood and Affect: Mood normal.     LABORATORY DATA:  I have reviewed the data as listed    Latest Ref Rng & Units 11/21/2023    8:26 AM 10/31/2023    8:05 AM 10/17/2023   10:47 AM  CBC  WBC 4.0 - 10.5 K/uL 4.4  6.6  7.3   Hemoglobin 12.0 - 15.0 g/dL 89.6  89.3  89.4   Hematocrit 36.0 - 46.0 % 32.3  33.0  32.5   Platelets 150 - 400 K/uL 216  199  475       Latest Ref Rng & Units 11/21/2023    8:26 AM 10/31/2023    8:05 AM 10/17/2023   10:47 AM  CMP  Glucose 70 - 99 mg/dL 866  870  858   BUN 8 - 23 mg/dL 29  28  27    Creatinine 0.44 - 1.00 mg/dL 8.96  8.86  8.79   Sodium 135 - 145 mmol/L 136  138  134   Potassium 3.5 - 5.1 mmol/L 4.5  4.7  4.5   Chloride 98 - 111 mmol/L 104  106  103   CO2 22 - 32 mmol/L 24  24  20    Calcium 8.9 - 10.3 mg/dL 9.1  9.1  9.4   Total Protein 6.5 - 8.1 g/dL 6.6  6.5  7.3   Total Bilirubin 0.0 - 1.2 mg/dL 0.5  0.5  0.7   Alkaline Phos 38 - 126 U/L 84  89  93   AST 15 - 41 U/L 32  34  26   ALT 0 - 44 U/L 27  32  23       RADIOGRAPHIC STUDIES: I have personally reviewed the radiological images as listed and agreed with the findings in the report. CT Chest W Contrast Result Date: 10/09/2023 CLINICAL DATA:  Peritoneal carcinomatosis. Staging. * Tracking Code: BO * EXAM: CT CHEST WITH CONTRAST TECHNIQUE: Multidetector CT imaging of the chest was performed during intravenous contrast administration. RADIATION DOSE REDUCTION: This exam was performed according to the departmental dose-optimization program which includes automated exposure control, adjustment of the mA and/or kV according to patient size and/or use  of iterative reconstruction technique. CONTRAST:  60mL OMNIPAQUE  IOHEXOL  300 MG/ML  SOLN COMPARISON:  Abdomen pelvis CT 09/16/2023. FINDINGS: Cardiovascular: Heart is nonenlarged. Trace pericardial fluid or thickening. The thoracic aorta is normal course and caliber. Mild atherosclerotic changes. Minimal coronary calcifications are seen. Mediastinum/Nodes: Preserved thyroid  gland. The thoracic esophagus has a normal course and caliber. No specific abnormal lymph node enlargement identified in the axillary region or hila. There are a few left internal mammary chain nodes identified such as image 50 and 63. The larger focus on image 63 measures 6 x 11 mm, abnormal for a node in this location. Small pre cardiophrenic nodes identified such as image 95 on the right side  and image 102. Lesion on image 102 measures 15 x 7 mm. Abnormal for node in this location. Small subcarinal and paraesophageal nodes identified, less than a cm in short axis. Lungs/Pleura: There is some linear opacity lung bases likely scar or atelectasis. No consolidation, pneumothorax. Trace left-sided pleural fluid. There are several tiny lung nodules identified. Example right lower lobe measures 4 mm on series 4, image 76. Example right lower lobe image 65 measures 4 mm. Slightly larger focus superior segment right lower lobe on image 51 of series 4 measuring 5 mm. There are a few on the left side as well. Gallbladder calcified but the vast majority are noncalcified. Up to 20 nodules are seen in the right lung and and 15 on the left. Although these are small, with the abdominal findings are worrisome for early lung metastases. Upper Abdomen: Ascites identified liver and splenic masses. Gallstones. Please see recent abdomen and pelvis CT scan. Musculoskeletal: Scattered degenerative changes along the spine. Partial fusion of the vertebral bodies at T3 and T4 which trace listhesis of T3 on T4. Findings are likely congenital. IMPRESSION: Several  scattered sub 5 mm bilateral lung nodules identified. Based on appearance and other findings these are worrisome for early lung metastases. Few enlarged pre cardiophrenic mediastinal nodes as well as left internal mammary chain lymph nodes also worrisome for spread of disease. Tiny left pleural effusion. Please correlate with previous abdomen pelvis CT describing abdominal findings. Aortic Atherosclerosis (ICD10-I70.0). Electronically Signed   By: Ranell Bring M.D.   On: 10/09/2023 14:23   CT ABDOMINAL MASS BIOPSY Result Date: 09/26/2023 INDICATION: 73 year old female with peritoneal carcinomatosis, presents for biopsy EXAM: CT BIOPSY MEDICATIONS: None. ANESTHESIA/SEDATION: Moderate (conscious) sedation was employed during this procedure. A total of Versed  1.0 mg and Fentanyl  50 mcg was administered intravenously. Moderate Sedation Time: 20 minutes. The patient's level of consciousness and vital signs were monitored continuously by radiology nursing throughout the procedure under my direct supervision. FLUOROSCOPY TIME:  CT COMPLICATIONS: None PROCEDURE: Informed written consent was obtained from the patient after a thorough discussion of the procedural risks, benefits and alternatives. All questions were addressed. Maximal Sterile Barrier Technique was utilized including caps, mask, sterile gowns, sterile gloves, sterile drape, hand hygiene and skin antiseptic. A timeout was performed prior to the initiation of the procedure. Patient positioned supine on the CT gantry table. Scout CT acquired for planning purposes. The patient is prepped and draped in the usual sterile fashion. 1% lidocaine  was used for local anesthesia. Using CT guidance, introducer needle was advanced into the targeted mass in the left upper quadrant from an anterior approach. Once we confirmed needle tip position multiple 18 gauge core biopsy were acquired. Specimen placed into formalin. Needle was removed and a final CT was performed.  Patient tolerated the procedure well and remained hemodynamically stable throughout. No complications were encountered and no significant blood loss. IMPRESSION: Status post CT-guided biopsy of left upper quadrant peritoneal mass. Signed, Ami RAMAN. Alona ROSALEA GRAVER, RPVI [facility] Radiology Electronically Signed   By: Ami Alona D.O.   On: 09/26/2023 12:11   US  Paracentesis Result Date: 09/24/2023 INDICATION: 73 year old female. History of peritoneal carcinomatosis with recurrent ascites. Request is for therapeutic and diagnostic paracentesis EXAM: ULTRASOUND GUIDED THERAPEUTIC AND DIAGNOSTIC LEFT-SIDED PARACENTESIS MEDICATIONS: lidocaine  1% 10 mL COMPLICATIONS: None immediate. PROCEDURE: Informed written consent was obtained from the patient after a discussion of the risks, benefits and alternatives to treatment. A timeout was performed prior to the  initiation of the procedure. Initial ultrasound scanning demonstrates a small amount of ascites within the left lower abdominal quadrant. The left lower abdomen was prepped and draped in the usual sterile fashion. 1% lidocaine  was used for local anesthesia. Following this, a 19 gauge, 7-cm, Yueh catheter was introduced. An ultrasound image was saved for documentation purposes. The paracentesis was performed. The catheter was removed and a dressing was applied. The patient tolerated the procedure well without immediate post procedural complication. FINDINGS: A total of approximately 2.5 L of straw-colored fluid was removed. Samples were sent to the laboratory as requested by the clinical team. IMPRESSION: Successful ultrasound-guided therapeutic and diagnostic left-sided paracentesis yielding 2 point liters of peritoneal fluid. Performed by Delon Beagle NP Electronically Signed   By: Wilkie Lent M.D.   On: 09/24/2023 12:44   CT ABDOMEN PELVIS W CONTRAST Result Date: 09/16/2023 CLINICAL DATA:   Abdominal pain, acute, nonlocalized. * Tracking Code: BO * EXAM: CT ABDOMEN AND PELVIS WITH CONTRAST TECHNIQUE: Multidetector CT imaging of the abdomen and pelvis was performed using the standard protocol following bolus administration of intravenous contrast. RADIATION DOSE REDUCTION: This exam was performed according to the departmental dose-optimization program which includes automated exposure control, adjustment of the mA and/or kV according to patient size and/or use of iterative reconstruction technique. CONTRAST:  OMNIPAQUE  IOHEXOL  300 MG/ML  SOLN COMPARISON:  None Available. FINDINGS: Lower chest: There are at least 3, sub 4 mm, solid noncalcified nodules in the visualized bilateral lungs with largest measuring up to 3.4 x 4.0 mm in the right lung lower lobe (series 4, image 1), partially seen. Please see follow-up recommendations below. There are patchy atelectatic changes in the visualized lung bases. No overt consolidation. No pleural effusion. The heart is normal in size. No pericardial effusion. Hepatobiliary: The liver is normal in size. Non-cirrhotic configuration. There are at least 3, hypoattenuating masses in the right hepatic lobe with largest in the segment 6, subcapsular location measuring 3.5 x 4.5 cm, compatible with metastases. There also ill-defined is similar characteristic hypoattenuating areas surrounding the liver along the region of caudate lobe, highly concerning for serosal implants. There is an additional 2.1 x 2.2 cm hypoattenuating lesion in the left hepatic lobe, segments 2/4 a, which may represent a cyst. No intrahepatic or extrahepatic bile duct dilation. Small volume dependent calcified gallstones noted without imaging signs of acute cholecystitis. There is focal wall thickening near the fundus of the gallbladder, most likely secondary to underlying adenomyomatosis. Normal gallbladder wall thickness. No pericholecystic inflammatory changes. Pancreas: Small/atrophic  pancreas. No discrete suspicious mass noted. Main pancreatic duct is not dilated. No peripancreatic fat stranding. Spleen: Normal size spleen. There are multiple hypoattenuating masses with largest along the lateral subcapsular area measuring 2.0 x 3.2 cm, highly concerning for metastases. Adrenals/Urinary Tract: Adrenal glands are unremarkable. Bilateral kidneys are small/atrophic and exhibits persistent fetal lobulations. No hydroureteronephrosis or nephroureterolithiasis. Unremarkable urinary bladder. Stomach/Bowel: No disproportionate dilation of the small or large bowel loops. No evidence of abnormal bowel wall thickening or inflammatory changes. The appendix was not distinctly separately visualized; however there is no acute inflammatory process in the right lower quadrant. Vascular/Lymphatic: There is small to moderate ascites. There are extensive centrally necrotic omental implants as well as multiple irregular hyperattenuating nodules along the peritoneal reflections, compatible with extensive peritoneal carcinomatosis. There multiple mildly enlarged retroperitoneal lymph nodes (for example, series 2, images 36, 41, 48, 55, etc.), with largest lymph node measuring up to 1.1 x 1.8 cm, also highly concerning  for metastases. 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 Reproductive: Limited evaluation on CT scan exam. Normal-size anteverted uterus noted. 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 There is mixed solid/cystic appearance of bilateral ovaries, which may be due to drop metastasis along the surface. However, correlate clinically and with tumor markers to determine the need for additional imaging with contrast-enhanced MRI pelvis. Other: There are tumor deposit along the undersurface of the bilateral hemidiaphragm, right more than left. There are small fat containing umbilical and right inguinal hernias. The soft tissues and  abdominal wall are otherwise unremarkable. Musculoskeletal: No suspicious osseous lesions. There are mild multilevel degenerative changes in the visualized spine. IMPRESSION: 1. There are findings compatible with extensive omental and peritoneal carcinomatosis with small to moderate ascites. There are multiple hypoattenuating masses in the liver and spleen, compatible with metastases. There are also multiple mildly enlarged retroperitoneal lymph nodes, also highly concerning for metastases. 2. There is mixed solid/cystic appearance of bilateral ovaries, which may be due to drop metastasis along the surface. However, correlate clinically and with tumor markers to determine the need for additional imaging with contrast-enhanced MRI pelvis. 3. There are at least 3, sub 4 mm, solid noncalcified nodules in the visualized bilateral lungs with largest measuring up to 3.4 x 4.0 mm in the right lung lower lobe, partially seen. In the given clinical context, these are also highly concerning for metastases. Consider further evaluation with dedicated chest CT and establish a baseline. 4. Multiple other nonacute observations, as described above. Electronically Signed   By: Ree Molt M.D.   On: 09/16/2023 12:10

## 2023-11-21 NOTE — Assessment & Plan Note (Signed)
 Continue Celexa 40 mg daily.

## 2023-11-21 NOTE — Assessment & Plan Note (Signed)
 Grade 1/2  Monitor.

## 2023-11-21 NOTE — Assessment & Plan Note (Signed)
 Chemotherapy treatments as listed above.

## 2023-11-21 NOTE — Assessment & Plan Note (Signed)
 Encouraged oral hydration, avoid nephrotoxins.

## 2023-11-21 NOTE — Progress Notes (Signed)
 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  Receiving carboplatin  and taxol   Met with patient during infusion.  Patient reports that appetite is much better.  She is feeling better.  I maybe eating too much now but I am not worried about it.     Medications: reviewed  Labs: reviewed  Anthropometrics:   Weight 156 lb 6.4 oz today  147 lb on 6/19 163 lb on 2/3   NUTRITION DIAGNOSIS: Inadequate oral intake improved    INTERVENTION:  Continue foods rich in protein and adequate calories to maintain weight RD available if needed    NEXT VISIT: as needed  Beth Hart B. Dasie SOLON, CSO, LDN Registered Dietitian [phone]

## 2023-11-22 LAB — CA 125: Cancer Antigen (CA) 125: 218 U/mL — ABNORMAL HIGH (ref 0.0–38.1)

## 2023-11-23 ENCOUNTER — Other Ambulatory Visit: Payer: Self-pay | Admitting: Hospice and Palliative Medicine

## 2023-11-25 ENCOUNTER — Other Ambulatory Visit: Payer: Self-pay | Admitting: Hospice and Palliative Medicine

## 2023-11-25 ENCOUNTER — Other Ambulatory Visit: Payer: Self-pay

## 2023-11-25 MED FILL — Lactulose Solution 10 GM/15ML: ORAL | 3 days supply | Qty: 236 | Fill #0 | Status: AC

## 2023-12-02 ENCOUNTER — Ambulatory Visit
Admission: RE | Admit: 2023-12-02 | Discharge: 2023-12-02 | Disposition: A | Discharge status: Home / Self Care | Source: Ambulatory Visit | Attending: Oncology | Admitting: Oncology

## 2023-12-02 DIAGNOSIS — C786 Secondary malignant neoplasm of retroperitoneum and peritoneum: Secondary | ICD-10-CM | POA: Insufficient documentation

## 2023-12-02 DIAGNOSIS — C563 Malignant neoplasm of bilateral ovaries: Secondary | ICD-10-CM | POA: Insufficient documentation

## 2023-12-02 LAB — POCT I-STAT CREATININE: Creatinine, Ser: 1.2 mg/dL — ABNORMAL HIGH (ref 0.44–1.00)

## 2023-12-02 MED ORDER — IOHEXOL 300 MG/ML  SOLN
100.0000 mL | Freq: Once | INTRAMUSCULAR | Status: AC | PRN
  Administered 2023-12-02: 100 mL via INTRAVENOUS

## 2023-12-04 ENCOUNTER — Encounter: Payer: Self-pay | Admitting: Oncology

## 2023-12-04 ENCOUNTER — Inpatient Hospital Stay: Attending: Oncology | Admitting: Licensed Clinical Social Worker

## 2023-12-04 DIAGNOSIS — Z79630 Long term (current) use of alkylating agent: Secondary | ICD-10-CM | POA: Insufficient documentation

## 2023-12-04 DIAGNOSIS — Z5111 Encounter for antineoplastic chemotherapy: Secondary | ICD-10-CM | POA: Insufficient documentation

## 2023-12-04 DIAGNOSIS — Z79633 Long term (current) use of mitotic inhibitor: Secondary | ICD-10-CM | POA: Insufficient documentation

## 2023-12-04 DIAGNOSIS — C786 Secondary malignant neoplasm of retroperitoneum and peritoneum: Secondary | ICD-10-CM | POA: Insufficient documentation

## 2023-12-04 DIAGNOSIS — C787 Secondary malignant neoplasm of liver and intrahepatic bile duct: Secondary | ICD-10-CM | POA: Insufficient documentation

## 2023-12-04 DIAGNOSIS — C569 Malignant neoplasm of unspecified ovary: Secondary | ICD-10-CM | POA: Insufficient documentation

## 2023-12-04 NOTE — Progress Notes (Signed)
 [facility] CSW Progress Note  Clinical Child psychotherapist contacted patient by phone to follow-up on emotional support.    Interventions: Patient interviewed and appropriate assessments performed: brief mental health assessment .  Patient reports improved mood since last contact.  She has gotten into a routine with her chemotherapy and feels much better.  She expressed no other needs.      Follow Up Plan:  CSW will follow-up with patient by phone     Macario CHRISTELLA Au, LCSW Clinical Social Worker [facility]

## 2023-12-04 NOTE — Progress Notes (Signed)
 Our provider has recommended:   Given patient is over 60 and taking citalopram  dose greater than 20mg , the recommendation is to have an ECG due to risk of QTc prolongation. This is an FDA warning. In addition, she is prescribed odansetron and compazine  which can also prolong QTc, making the need for the ECG even more relevant.  Please contact us  with any questions or concerns, thank you!  Fortunato Goodell, [facility]

## 2023-12-05 ENCOUNTER — Other Ambulatory Visit: Payer: Self-pay | Admitting: Hospice and Palliative Medicine

## 2023-12-05 ENCOUNTER — Other Ambulatory Visit: Payer: Self-pay

## 2023-12-05 DIAGNOSIS — C563 Malignant neoplasm of bilateral ovaries: Secondary | ICD-10-CM

## 2023-12-11 ENCOUNTER — Inpatient Hospital Stay: Admitting: Obstetrics and Gynecology

## 2023-12-11 ENCOUNTER — Other Ambulatory Visit: Payer: Self-pay

## 2023-12-11 ENCOUNTER — Encounter: Payer: Self-pay | Admitting: Obstetrics and Gynecology

## 2023-12-11 VITALS — BP 138/79 | HR 85 | Temp 97.9°F | Resp 19 | Wt 159.0 lb

## 2023-12-11 DIAGNOSIS — Z79630 Long term (current) use of alkylating agent: Secondary | ICD-10-CM | POA: Diagnosis not present

## 2023-12-11 DIAGNOSIS — C569 Malignant neoplasm of unspecified ovary: Secondary | ICD-10-CM | POA: Diagnosis not present

## 2023-12-11 DIAGNOSIS — C786 Secondary malignant neoplasm of retroperitoneum and peritoneum: Secondary | ICD-10-CM | POA: Diagnosis present

## 2023-12-11 DIAGNOSIS — Z5111 Encounter for antineoplastic chemotherapy: Secondary | ICD-10-CM | POA: Diagnosis present

## 2023-12-11 DIAGNOSIS — Z79633 Long term (current) use of mitotic inhibitor: Secondary | ICD-10-CM | POA: Diagnosis not present

## 2023-12-11 DIAGNOSIS — C787 Secondary malignant neoplasm of liver and intrahepatic bile duct: Secondary | ICD-10-CM | POA: Diagnosis not present

## 2023-12-11 NOTE — Progress Notes (Signed)
 Gynecologic Oncology Consult Visit   Referring Provider: Dr. [name]  Chief Concern: High grade serous ovarian cancer, stage IV  Subjective:  Beth Hart is a 73 y.o. G1P1 female who is seen in consultation from Dr. Cap who saw her at the request of Dr. Abbey for peritoneal carcinomatosis and a liver lesion.    Being treated by Dr Cap for advanced stage IV high grade serous ovarian cancer.  She did not tolerate Carboplatin  AUC 6 with Paclitaxel  175mg /m2 x1 cycle.  On dose reduced regimen carboplatin  AUC 5, Paclitaxel  135mg /m2 x2 cycles Cycle 4 scheduled 12/12/23  11/21/23 CA125 218, down from 609 at diagnosis.   CT scan 12/02/23 CHEST:   MEDIASTINUM: Heart and pericardium are unremarkable. The central airways are clear.   THORACIC LYMPH NODES: No mediastinal, hilar or axillary lymphadenopathy.   LUNGS AND PLEURA: Right lower lobe lung nodule measures 3 mm, image 64/4, previously 5 mm. The previous 4 mm right lower lobe lung nodule has resolved in the interval. Previous 4 mm nodule within the lower right lower lobe measures 2 mm, image 92/4, previously 4 mm. Calcified granuloma identified within the left upper lobe. No pleural effusion or pneumothorax.   ABDOMEN AND PELVIS:   LIVER: Unchanged cyst along the dome of left lobe of liver measuring 2.3 cm, image 46/2. Inferior right lobe of liver metastasis measures 3.5 x 2.2 cm, image 59/2, previously 4.5 x 3.5 cm. Index lesion within segment 5 measures 1.3 x 0.9 cm, image 59/2, previously 1.6 x 1.1 cm. No new liver lesions.   GALLBLADDER AND BILE DUCTS: Gallstones are again noted. No biliary ductal dilatation. Common bile duct is upper limits of normal measuring 6 mm, similar to previous exam.   SPLEEN: Multiple splenic metastases. Index lesion within the posterior lateral spleen measures 2.4 x 1.6 cm, image 55/2, previously 3.2 x 2.0 cm. Inferior splenic lesion measures 3.1 x 2.5 cm, previously this measured the same.    PANCREAS: No pancreatic inflammation, main duct dilatation or mass.   ADRENAL GLANDS: Normal adrenal glands.   KIDNEYS, URETERS AND BLADDER: Bilateral renal cortical scarring and volume loss noted. No nephrolithiasis, hydronephrosis or suspicious mass. No bladder abnormality.   GI AND BOWEL: No signs of bowel obstruction. Moderate stool burden noted throughout the colon.   PERITONEUM AND RETROPERITONEUM: Peritoneal metastasis again noted. Index lesion within the left hemiabdomen along the undersurface of the ventral abdominal wall measures 2.6 x 1.6 cm, image 75/2, on the previous exam this measured 4.3 x 2.6 cm. At a slightly higher level peritoneal nodule measures 2.2 x 1.8 cm, image 70/2, previously 3.1 x 2.6 cm. Soft tissue nodularity along the undersurface of both hemidiaphragms is also improved in the interval. Left upper quadrant peritoneal nodule measures 2.5 x 1.8 cm, image 63/2, on the previous exam is measured 3.3 x 2.4 cm. Interval resolution of previously noted ascites.   VASCULATURE: Aorta is normal in caliber.   ABDOMINAL AND PELVIS LYMPH NODES: Abdominal pelvic adenopathy is again noted. Index aortocaval lymph node measures 1 cm short axis, image 77/2, previously 1.1 cm. Index lymph node left periaortic lymph node measures 1.4 cm, image 60/2, previously 1.5 cm. Left common iliac lymph node measures 0.9 cm, image 84/2, previously 1.2 cm. Right external iliac node measures 0.9 cm, image 101/2, previously 1 cm.   REPRODUCTIVE ORGANS: No focal abnormality involving the uterus.   BONES AND SOFT TISSUES: No acute or suspicious osseous findings.   IMPRESSION: 1. Interval improvement in peritoneal  metastases, splenic metastases, and liver metastases. 2. Interval resolution of previously noted ascites. 3. Abdominal pelvic adenopathy, improved in the interval. 4. Interval improvement in right lower lobe lung nodules.  Gyn Oncology History 09/16/2023 She  experienced persistent pressure and sharp pain in her abdomen and fever, prompting her to visit the ER.   CT abdomen pelvis with contrast showed extensive omental and peritoneal carcinomatosis with small to moderate ascites. There are multiple hypoattenuating masses in the liver and spleen, compatible with metastases. There are also multiple mildly enlarged retroperitoneal lymph nodes, also highly concerning for metastases. 2. There is mixed solid/cystic appearance of bilateral ovaries, which may be due to drop metastasis along the surface. However, correlate clinically and with tumor markers to determine the need for additional imaging with contrast-enhanced MRI pelvis. 3. There are at least 3, sub 4 mm, solid noncalcified nodules in the visualized bilateral lungs with largest measuring up to 3.4 x 4.0 mm in the right lung lower lobe, partially seen. In the given clinical context, these are also highly concerning for metastases. Consider further evaluation with dedicated chest CT and establish a baseline.   09/18/2023 Tumor markers CA 27.29 415.6 High    CEA 1.2   Cancer Antigen (CA) 125 609.0 High    09/24/2023 Paracentesis - cytology positive 09/24/23 Normal PAP and endometrial biopsy 09/26/23 Peritoneum, biopsy, RUQ mass :  METASTATIC HIGH-GRADE SEROUS CARCINOMA WITH NECROSIS, SEE NOTE  Diagnosis Note : Immunohistochemical stains show that the tumor cells are positive for PAX8, p16 and p53 (clonal overexpression pattern) while they are negative for p63 and CK5/6.  This immunoprofile is consistent with above interpretation.      Ambry panel testing negative for germline mutation. [facility] MyChoice shows HRD but no somatic BRCA mutation   Her family history includes father who had lung cancer attributed to smoking, but no history of colon, ovarian, or breast cancer.   Colonoscopy a few years ago, which was reported to be negative.   Mammogram 02/14/2023, which was negative.  She has  excellent family support and her husband is here today.    Genetic testing: pending result of biopsy Molecular testing: pending result of biopsy  Problem List: Patient Active Problem List   Diagnosis Date Noted   Genetic testing 10/21/2023   Weight loss 10/17/2023   Chemotherapy-induced neuropathy (HCC) 10/17/2023   IDA (iron  deficiency anemia) 10/10/2023   Encounter for antineoplastic chemotherapy 10/10/2023   Symptomatic anemia 10/10/2023   Ovarian cancer (HCC) 10/04/2023   Liver lesion 09/18/2023   Peritoneal carcinomatosis (HCC) 09/18/2023   Iron  deficiency anemia 09/18/2023   Positive self-administered antigen test for COVID-19 01/16/2023   Routine general medical examination at a health care facility 08/17/2022   Hyperparathyroidism due to renal insufficiency (HCC) 08/16/2022   Prediabetes 08/15/2021   Obesity (BMI 30.0-34.9) 07/25/2020   Proteinuria 06/25/2019   Depression 11/23/2015   CKD (chronic kidney disease) stage 3, GFR 30-59 ml/min (HCC) 11/23/2015   Hyperlipidemia 11/23/2015    Past Medical History: Past Medical History:  Diagnosis Date   Anemia    Arthritis    Asthma    as a child   Chicken pox    Colon polyps 2012   Depression    Hemorrhoids    Ovarian cancer [facility])     Past Surgical History: Past Surgical History:  Procedure Laterality Date   BREAST CYST ASPIRATION Left 90s   BREAST SURGERY  2007   aspiration   CARPAL TUNNEL RELEASE  2010   REFRACTIVE  SURGERY     TONSILLECTOMY      Past Gynecologic History:  Menarche: in 1964 Menstrual details: Postmenopausal Last Menstrual Period: unknown History of Abnormal pap: No history of abnormal Paps   OB History:  OB History  Gravida Para Term Preterm AB Living  1 1 1   1   SAB IAB Ectopic Multiple Live Births      1    # Outcome Date GA Lbr Len/2nd Weight Sex Type Anes PTL Lv  1 Term             Family History: Family History  Problem Relation Age of Onset   Alzheimer's disease  Mother    Lung cancer Father    Prostate cancer Maternal Uncle    Prostate cancer Maternal Uncle    Prostate cancer Maternal Uncle    Prostate cancer Maternal Uncle    Prostate cancer Maternal Uncle    Prostate cancer Other        in mat great uncles, unk#   Prostate cancer Cousin        in 4-5 mat  first cousins   Breast cancer Neg Hx     Social History: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  She has 1 biological child and 2 children from the marriage.  She has 4 grandchildren. Social History   Socioeconomic History   Marital status: Married    Spouse name: Not on file   Number of children: Not on file   Years of education: Not on file   Highest education level: Associate degree: occupational, Scientist, product/process development, or vocational program  Occupational History   Not on file  Tobacco Use   Smoking status: Never   Smokeless tobacco: Never  Vaping Use   Vaping status: Never Used  Substance and Sexual Activity   Alcohol use: No   Drug use: No   Sexual activity: Yes  Other Topics Concern   Not on file  Social History Narrative   married   Social Drivers of Corporate investment banker Strain: Low Risk  (11/03/2023)   Overall Financial Resource Strain (CARDIA)    Difficulty of Paying Living Expenses: Not hard at all  Food Insecurity: No Food Insecurity (11/03/2023)   Hunger Vital Sign    Worried About Running Out of Food in the Last Year: Never true    Ran Out of Food in the Last Year: Never true  Transportation Needs: No Transportation Needs (11/03/2023)   PRAPARE - Administrator, Civil Service (Medical): No    Lack of Transportation (Non-Medical): No  Physical Activity: Inactive (11/03/2023)   Exercise Vital Sign    Days of Exercise per Week: 0 days    Minutes of Exercise per Session: Not on file  Stress: No Stress Concern Present (11/03/2023)   [employer] of Occupational Health - Occupational Stress Questionnaire     Feeling of Stress: Only a little  Social Connections: Socially Integrated (11/03/2023)   Social Connection and Isolation Panel    Frequency of Communication with Friends and Family: More than three times a week    Frequency of Social Gatherings with Friends and Family: Once a week    Attends Religious Services: More than 4 times per year    Active Member of [employer] or Organizations: Yes    Attends Engineer, structural: More than 4 times per year    Marital Status: Married  Catering manager Violence: Not At  Risk (09/25/2023)   Humiliation, Afraid, Rape, and Kick questionnaire    Fear of Current or Ex-Partner: No    Emotionally Abused: No    Physically Abused: No    Sexually Abused: No    Allergies: No Known Allergies  Current Medications: Current Outpatient Medications  Medication Sig Dispense Refill   citalopram  (CELEXA ) 40 MG tablet Take 1 tablet (40 mg total) by mouth daily. 30 tablet 3   dexamethasone  (DECADRON ) 4 MG tablet Take 2 tablets (8mg ) by mouth daily starting the day after carboplatin  for 3 days. Take with food 30 tablet 1   lactulose  (CHRONULAC ) 10 GM/15ML solution take15 to 30 ml 3 times a day when needed for constipation 236 mL 0   nystatin  (MYCOSTATIN ) 100000 UNIT/ML suspension Take 5 mLs (500,000 Units total) by mouth 4 (four) times daily. Swish and swallow 60 mL 0   omeprazole  (PRILOSEC) 20 MG capsule Take 1 capsule (20 mg total) by mouth daily. 30 capsule 0   ondansetron  (ZOFRAN ) 8 MG tablet Take 1 tablet (8 mg total) by mouth every 8 (eight) hours as needed for nausea or vomiting. Start on the third day after carboplatin . 30 tablet 1   oxyCODONE  (OXY IR/ROXICODONE ) 5 MG immediate release tablet Take 1 tablet (5 mg total) by mouth every 8 (eight) hours as needed for severe pain (pain score 7-10). 30 tablet 0   polyethylene glycol powder (GLYCOLAX/MIRALAX) 17 GM/SCOOP powder Take by mouth once.     prochlorperazine  (COMPAZINE ) 10 MG tablet Take 1 tablet (10 mg  total) by mouth every 6 (six) hours as needed for nausea or vomiting. (Patient not taking: Reported on 11/21/2023) 30 tablet 1   senna (SENOKOT) 8.6 MG TABS tablet Take 1 tablet (8.6 mg total) by mouth daily as needed for mild constipation. 120 tablet 0   No current facility-administered medications for this visit.   Facility-Administered Medications Ordered in Other Visits  Medication Dose Route Frequency Provider Last Rate Last Admin   heparin  lock flush 100 unit/mL  500 Units Intravenous Once Babara Call, MD        Review of Systems General: negative for changes in weight Skin: negative for changes in moles or sores or rash Eyes: negative for changes in vision HEENT: positive for tinnitus; negative for change in hearing, voice changes Pulmonary:negative orthopnea or wheezing Cardiac: negative for palpitations, pain Gastrointestinal: h/o IBS with high FODMAP foods; negative for nausea, vomiting, constipation, diarrhea, hematemesis, hematochezia Genitourinary/Sexual: negative for dysuria, retention, hematuria, incontinence Musculoskeletal: negative for pain, joint pain, back pain Hematology: negative for easy bruising Neurologic/Psych: negative for headaches, seizures, paralysis, weakness, numbness  Objective:  Physical Examination:  BP 138/79   Pulse 85   Temp 97.9 F (36.6 C)   Resp 19   Wt 159 lb (72.1 kg)   SpO2 98%   BMI 29.08 kg/m   Performance status: 1   GENERAL: Patient is a well appearing female in no acute distress HEENT:  PERRL, neck supple with midline trachea.  NODES:  No cervical, supraclavicular, axillary, or inguinal lymphadenopathy palpated.  LUNGS: Normal respiratory effort ABDOMEN:  Soft, nontender.  No ascites or masses. EXTREMITIES:  No peripheral edema.   NEURO:  Nonfocal. Well oriented.  Appropriate affect.  Pelvic: EGBUS: no lesions Cervix: no lesions, nontender, mobile Vagina: no lesions, no discharge or bleeding Uterus: normal size, nontender,  mobile Adnexa: no masses in the cul-de-sac. Rectovaginal: confirmatory.  deferred.  Lab Review Lab Results  Component Value Date   WBC 4.4 11/21/2023  HGB 10.3 (L) 11/21/2023   HCT 32.3 (L) 11/21/2023   MCV 91.0 11/21/2023   PLT 216 11/21/2023     Chemistry      Component Value Date/Time   NA 136 11/21/2023 0826   NA 140 12/18/2018 0000   K 4.5 11/21/2023 0826   CL 104 11/21/2023 0826   CO2 24 11/21/2023 0826   BUN 29 (H) 11/21/2023 0826   BUN 20 12/18/2018 0000   CREATININE 1.20 (H) 12/02/2023 1417   CREATININE 1.03 (H) 11/21/2023 0826   GLU 97 12/18/2018 0000      Component Value Date/Time   CALCIUM 9.1 11/21/2023 0826   ALKPHOS 84 11/21/2023 0826   AST 32 11/21/2023 0826   ALT 27 11/21/2023 0826   BILITOT 0.5 11/21/2023 0826        Radiologic Imaging: 09/16/2023 CLINICAL DATA:  Abdominal pain, acute, nonlocalized. * Tracking Code: BO *   EXAM: CT ABDOMEN AND PELVIS WITH CONTRAST   TECHNIQUE: Multidetector CT imaging of the abdomen and pelvis was performed using the standard protocol following bolus administration of intravenous contrast.   RADIATION DOSE REDUCTION: This exam was performed according to the departmental dose-optimization program which includes automated exposure control, adjustment of the mA and/or kV according to patient size and/or use of iterative reconstruction technique.   CONTRAST:  OMNIPAQUE  IOHEXOL  300 MG/ML  SOLN   COMPARISON:  None Available.   FINDINGS: Lower chest: There are at least 3, sub 4 mm, solid noncalcified nodules in the visualized bilateral lungs with largest measuring up to 3.4 x 4.0 mm in the right lung lower lobe (series 4, image 1), partially seen. Please see follow-up recommendations below. There are patchy atelectatic changes in the visualized lung bases. No overt consolidation. No pleural effusion. The heart is normal in size. No pericardial effusion.   Hepatobiliary: The liver is normal in size.  Non-cirrhotic configuration. There are at least 3, hypoattenuating masses in the right hepatic lobe with largest in the segment 6, subcapsular location measuring 3.5 x 4.5 cm, compatible with metastases. There also ill-defined is similar characteristic hypoattenuating areas surrounding the liver along the region of caudate lobe, highly concerning for serosal implants. There is an additional 2.1 x 2.2 cm hypoattenuating lesion in the left hepatic lobe, segments 2/4 a, which may represent a cyst. No intrahepatic or extrahepatic bile duct dilation. Small volume dependent calcified gallstones noted without imaging signs of acute cholecystitis. There is focal wall thickening near the fundus of the gallbladder, most likely secondary to underlying adenomyomatosis. Normal gallbladder wall thickness. No pericholecystic inflammatory changes.   Pancreas: Small/atrophic pancreas. No discrete suspicious mass noted. Main pancreatic duct is not dilated. No peripancreatic fat stranding.   Spleen: Normal size spleen. There are multiple hypoattenuating masses with largest along the lateral subcapsular area measuring 2.0 x 3.2 cm, highly concerning for metastases.   Adrenals/Urinary Tract: Adrenal glands are unremarkable. Bilateral kidneys are small/atrophic and exhibits persistent fetal lobulations. No hydroureteronephrosis or nephroureterolithiasis. Unremarkable urinary bladder.   Stomach/Bowel: No disproportionate dilation of the small or large bowel loops. No evidence of abnormal bowel wall thickening or inflammatory changes. The appendix was not distinctly separately visualized; however there is no acute inflammatory process in the right lower quadrant.   Vascular/Lymphatic: There is small to moderate ascites. There are extensive centrally necrotic omental implants as well as multiple irregular hyperattenuating nodules along the peritoneal reflections, compatible with extensive peritoneal  carcinomatosis. There multiple mildly enlarged retroperitoneal lymph nodes (for example, series 2, images  36, 41, 48, 55, etc.), with largest lymph node measuring up to 1.1 x 1.8 cm, also highly concerning for metastases. 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   Reproductive: Limited evaluation on CT scan exam. Normal-size anteverted uterus noted. 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 There is mixed solid/cystic appearance of bilateral ovaries, which may be due to drop metastasis along the surface. However, correlate clinically and with tumor markers to determine the need for additional imaging with contrast-enhanced MRI pelvis.   Other: There are tumor deposit along the undersurface of the bilateral hemidiaphragm, right more than left. There are small fat containing umbilical and right inguinal hernias. The soft tissues and abdominal wall are otherwise unremarkable.   Musculoskeletal: No suspicious osseous lesions. There are mild multilevel degenerative changes in the visualized spine.   IMPRESSION: 1. There are findings compatible with extensive omental and peritoneal carcinomatosis with small to moderate ascites. There are multiple hypoattenuating masses in the liver and spleen, compatible with metastases. There are also multiple mildly enlarged retroperitoneal lymph nodes, also highly concerning for metastases. 2. There is mixed solid/cystic appearance of bilateral ovaries, which may be due to drop metastasis along the surface. However, correlate clinically and with tumor markers to determine the need for additional imaging with contrast-enhanced MRI pelvis. 3. There are at least 3, sub 4 mm, solid noncalcified nodules in the visualized bilateral lungs with largest measuring up to 3.4 x 4.0 mm in the right lung lower lobe, partially seen. In the given clinical context, these are also highly  concerning for metastases. Consider further evaluation with dedicated chest CT and establish a baseline. 4. Multiple other nonacute observations, as described above.     Electronically Signed   By: Ree Molt M.D.   On: 09/16/2023 12:10      Assessment:  KARLEI WALDO is a 73 y.o. female diagnosed with stage IV high grade serous ovarian cancer with peritoneal carcinomatosis 5/25,  CT scan shows liver lesions, splenic lesion, pulmonary nodule, enlarged lymph nodes, small ovarian masses and elevated CA27.29 and CA125, 5/25 Normal PAP and endometrial biopsy. Peritoneum, biopsy, RUQ mass : METASTATIC HIGH-GRADE SEROUS CARCINOMA WITH NECROSIS, tumor cells are positive for PAX8, p16 and p53.   Ambry panel testing negative for germline mutations. [facility] MyChoice shows HRD but no somatic BRCA mutation  Treated with Carboplatin /Taxol  x 3 cycles.  Cycle 4 tomorrow. CA125 went from 609 to 218 after two cycles. CT 12/02/23 shows considerable improvement, but still intrahepatic and splenic metastases.  Mild anemia, azotemia  Medical co-morbidities complicating care: Anemia, azotemia, asthma Plan:   Problem List Items Addressed This Visit       Endocrine   Ovarian cancer (HCC) - Primary (Chronic)   Plan for cycle 4 of chemotherapy tomorrow and then will see her at [facility] to schedule interval debulking. Could consider the HOTT trial of IP hyperthermic chemotherapy at debulking or the LANCE trial of MIS vs open surgery.  However, she has persistent liver metastases that do not appear to be amenable to easy resection, as they are not peripheral.  Will discuss with the research team in advance of her preop appt at [facility].        Prentice Agent, MD   CC:  Dr. [name]

## 2023-12-12 ENCOUNTER — Encounter: Payer: Self-pay | Admitting: Oncology

## 2023-12-12 ENCOUNTER — Inpatient Hospital Stay

## 2023-12-12 ENCOUNTER — Other Ambulatory Visit: Payer: Self-pay

## 2023-12-12 ENCOUNTER — Inpatient Hospital Stay: Admitting: Oncology

## 2023-12-12 VITALS — BP 155/73 | HR 77 | Resp 18

## 2023-12-12 VITALS — BP 137/76 | HR 79 | Temp 97.2°F | Resp 18 | Wt 158.3 lb

## 2023-12-12 DIAGNOSIS — C563 Malignant neoplasm of bilateral ovaries: Secondary | ICD-10-CM

## 2023-12-12 DIAGNOSIS — T451X5A Adverse effect of antineoplastic and immunosuppressive drugs, initial encounter: Secondary | ICD-10-CM | POA: Diagnosis not present

## 2023-12-12 DIAGNOSIS — G620 Drug-induced polyneuropathy: Secondary | ICD-10-CM | POA: Diagnosis not present

## 2023-12-12 DIAGNOSIS — Z5181 Encounter for therapeutic drug level monitoring: Secondary | ICD-10-CM | POA: Diagnosis not present

## 2023-12-12 DIAGNOSIS — F330 Major depressive disorder, recurrent, mild: Secondary | ICD-10-CM

## 2023-12-12 DIAGNOSIS — C786 Secondary malignant neoplasm of retroperitoneum and peritoneum: Secondary | ICD-10-CM

## 2023-12-12 DIAGNOSIS — N1831 Chronic kidney disease, stage 3a: Secondary | ICD-10-CM

## 2023-12-12 DIAGNOSIS — Z5111 Encounter for antineoplastic chemotherapy: Secondary | ICD-10-CM

## 2023-12-12 LAB — CMP (CANCER CENTER ONLY)
ALT: 22 U/L (ref 0–44)
AST: 26 U/L (ref 15–41)
Albumin: 3.4 g/dL — ABNORMAL LOW (ref 3.5–5.0)
Alkaline Phosphatase: 70 U/L (ref 38–126)
Anion gap: 8 (ref 5–15)
BUN: 36 mg/dL — ABNORMAL HIGH (ref 8–23)
CO2: 23 mmol/L (ref 22–32)
Calcium: 9.4 mg/dL (ref 8.9–10.3)
Chloride: 105 mmol/L (ref 98–111)
Creatinine: 1.22 mg/dL — ABNORMAL HIGH (ref 0.44–1.00)
GFR, Estimated: 47 mL/min — ABNORMAL LOW (ref >60)
Glucose, Bld: 112 mg/dL — ABNORMAL HIGH (ref 70–99)
Potassium: 4.6 mmol/L (ref 3.5–5.1)
Sodium: 136 mmol/L (ref 135–145)
Total Bilirubin: 0.5 mg/dL (ref 0.0–1.2)
Total Protein: 6.7 g/dL (ref 6.5–8.1)

## 2023-12-12 LAB — CBC WITH DIFFERENTIAL (CANCER CENTER ONLY)
Abs Immature Granulocytes: 0.04 K/uL (ref 0.00–0.07)
Basophils Absolute: 0 K/uL (ref 0.0–0.1)
Basophils Relative: 1 %
Eosinophils Absolute: 0.2 K/uL (ref 0.0–0.5)
Eosinophils Relative: 3 %
HCT: 33.3 % — ABNORMAL LOW (ref 36.0–46.0)
Hemoglobin: 10.7 g/dL — ABNORMAL LOW (ref 12.0–15.0)
Immature Granulocytes: 1 %
Lymphocytes Relative: 30 %
Lymphs Abs: 1.7 K/uL (ref 0.7–4.0)
MCH: 30.2 pg (ref 26.0–34.0)
MCHC: 32.1 g/dL (ref 30.0–36.0)
MCV: 94.1 fL (ref 80.0–100.0)
Monocytes Absolute: 0.6 K/uL (ref 0.1–1.0)
Monocytes Relative: 10 %
Neutro Abs: 3.3 K/uL (ref 1.7–7.7)
Neutrophils Relative %: 55 %
Platelet Count: 141 K/uL — ABNORMAL LOW (ref 150–400)
RBC: 3.54 MIL/uL — ABNORMAL LOW (ref 3.87–5.11)
RDW: 22.8 % — ABNORMAL HIGH (ref 11.5–15.5)
WBC Count: 5.8 K/uL (ref 4.0–10.5)
nRBC: 0 % (ref 0.0–0.2)

## 2023-12-12 MED ORDER — SODIUM CHLORIDE 0.9 % IV SOLN
360.5000 mg | Freq: Once | INTRAVENOUS | Status: AC
  Administered 2023-12-12: 360 mg via INTRAVENOUS
  Filled 2023-12-12: qty 36

## 2023-12-12 MED ORDER — DEXAMETHASONE SODIUM PHOSPHATE 10 MG/ML IJ SOLN
10.0000 mg | Freq: Once | INTRAMUSCULAR | Status: AC
  Administered 2023-12-12: 10 mg via INTRAVENOUS
  Filled 2023-12-12: qty 1

## 2023-12-12 MED ORDER — SODIUM CHLORIDE 0.9 % IV SOLN
135.0000 mg/m2 | Freq: Once | INTRAVENOUS | Status: AC
  Administered 2023-12-12: 240 mg via INTRAVENOUS
  Filled 2023-12-12: qty 40

## 2023-12-12 MED ORDER — FAMOTIDINE IN NACL 20-0.9 MG/50ML-% IV SOLN
20.0000 mg | Freq: Once | INTRAVENOUS | Status: AC
  Administered 2023-12-12: 20 mg via INTRAVENOUS
  Filled 2023-12-12: qty 50

## 2023-12-12 MED ORDER — PALONOSETRON HCL INJECTION 0.25 MG/5ML
0.2500 mg | Freq: Once | INTRAVENOUS | Status: AC
  Administered 2023-12-12: 0.25 mg via INTRAVENOUS
  Filled 2023-12-12: qty 5

## 2023-12-12 MED ORDER — DIPHENHYDRAMINE HCL 50 MG/ML IJ SOLN
25.0000 mg | Freq: Once | INTRAMUSCULAR | Status: AC
  Administered 2023-12-12: 25 mg via INTRAVENOUS
  Filled 2023-12-12: qty 1

## 2023-12-12 MED ORDER — SODIUM CHLORIDE 0.9 % IV SOLN
INTRAVENOUS | Status: DC
  Filled 2023-12-12: qty 250

## 2023-12-12 MED ORDER — APREPITANT 130 MG/18ML IV EMUL
130.0000 mg | Freq: Once | INTRAVENOUS | Status: AC
  Administered 2023-12-12: 130 mg via INTRAVENOUS
  Filled 2023-12-12: qty 18

## 2023-12-12 NOTE — Assessment & Plan Note (Signed)
 Grade 1/2  Monitor.

## 2023-12-12 NOTE — Assessment & Plan Note (Signed)
 Mood has been betters.  Discussed about option of decrease continue Celexa  to 20 mg daily.  EKG today showed normal QTc

## 2023-12-12 NOTE — Assessment & Plan Note (Signed)
 Image findings and pathology results were reviewed and discussed with patient. 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  Normal CEA.  Elevated CA125 609, elevated CA 27.29 -415  Recommended neoadjuvant chemotherapy with carboplatin /Taxol  +/- Bevacizumab for 3-6 cycles followed by reimaging, followed by possible debulking surgery. HRD positive    Cycle 1 Carboplatin  AUC 6 with Paclitaxel  175mg /m2 x1- did not tolerate.  On dose reduced regimen carboplatin  AUC 5, Paclitaxel  135mg /m2  Labs are reviewed and discussed with patient. Proceed with cycle 4 CT image results were reviewed with patient. Good partial response. There is plan of debulking surgery followed by    Gigi risk score is 3, high risk of thrombosis.  Recommend Eliquis  2.5 mg twice daily for prophylaxis.

## 2023-12-12 NOTE — Assessment & Plan Note (Signed)
 Chemotherapy treatments as listed above.

## 2023-12-12 NOTE — Assessment & Plan Note (Signed)
 Encouraged oral hydration, avoid nephrotoxins.

## 2023-12-12 NOTE — Patient Instructions (Signed)
 [facility] - A DEPT OF [facility]  Discharge Instructions: Thank you for choosing [facility] to provide your oncology and hematology care.  If you have a lab appointment with the Cancer Center, please go directly to the Cancer Center and check in at the registration area.  Wear comfortable clothing and clothing appropriate for easy access to any Portacath or PICC line.   We strive to give you quality time with your provider. You may need to reschedule your appointment if you arrive late (15 or more minutes).  Arriving late affects you and other patients whose appointments are after yours.  Also, if you miss three or more appointments without notifying the office, you may be dismissed from the clinic at the provider's discretion.      For prescription refill requests, have your pharmacy contact our office and allow 72 hours for refills to be completed.    Today you received the following chemotherapy and/or immunotherapy agents- taxol, carboplatin      To help prevent nausea and vomiting after your treatment, we encourage you to take your nausea medication as directed.  BELOW ARE SYMPTOMS THAT SHOULD BE REPORTED IMMEDIATELY: *FEVER GREATER THAN 100.4 F (38 C) OR HIGHER *CHILLS OR SWEATING *NAUSEA AND VOMITING THAT IS NOT CONTROLLED WITH YOUR NAUSEA MEDICATION *UNUSUAL SHORTNESS OF BREATH *UNUSUAL BRUISING OR BLEEDING *URINARY PROBLEMS (pain or burning when urinating, or frequent urination) *BOWEL PROBLEMS (unusual diarrhea, constipation, pain near the anus) TENDERNESS IN MOUTH AND THROAT WITH OR WITHOUT PRESENCE OF ULCERS (sore throat, sores in mouth, or a toothache) UNUSUAL RASH, SWELLING OR PAIN  UNUSUAL VAGINAL DISCHARGE OR ITCHING   Items with * indicate a potential emergency and should be followed up as soon as possible or go to the Emergency Department if any problems should occur.  Please show the CHEMOTHERAPY ALERT CARD or  IMMUNOTHERAPY ALERT CARD at check-in to the Emergency Department and triage nurse.  Should you have questions after your visit or need to cancel or reschedule your appointment, please contact [facility] - A DEPT OF [facility]  [phone] and follow the prompts.  Office hours are 8:00 a.m. to 4:30 p.m. Monday - Friday. Please note that voicemails left after 4:00 p.m. may not be returned until the following business day.  We are closed weekends and major holidays. You have access to a nurse at all times for urgent questions. Please call the main number to the clinic [phone] and follow the prompts.  For any non-urgent questions, you may also contact your provider using MyChart. We now offer e-Visits for anyone 36 and older to request care online for non-urgent symptoms. For details visit [email or web address].   Also download the MyChart app! Go to the app store, search "MyChart", open the app, select [place], and log in with your MyChart username and password.

## 2023-12-12 NOTE — Progress Notes (Signed)
 Hematology/Oncology Progress note [phone] [phone]    CHIEF COMPLAINTS/REASON FOR VISIT:  High-grade serous carcinoma of ovaries. peritoneal carcinomatosis, liver lesion.   ASSESSMENT & PLAN:    Cancer Staging  Ovarian cancer [facility]) Staging form: Ovary, Fallopian Tube, and Primary Peritoneal Carcinoma, AJCC 8th Edition - Clinical stage from 09/25/2023: FIGO Stage IV (cT3c, cN1b, cM1) - Signed by Babara Call, MD on 10/05/2023   Chemotherapy-induced neuropathy (HCC) Grade 1/2  Monitor.   Ovarian cancer [facility]) Image findings and pathology results were reviewed and discussed with patient. 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  Normal CEA.  Elevated CA125 609, elevated CA 27.29 -415  Recommended neoadjuvant chemotherapy with carboplatin /Taxol  +/- Bevacizumab for 3-6 cycles followed by reimaging, followed by possible debulking surgery. HRD positive    Cycle 1 Carboplatin  AUC 6 with Paclitaxel  175mg /m2 x1- did not tolerate.  On dose reduced regimen carboplatin  AUC 5, Paclitaxel  135mg /m2  Labs are reviewed and discussed with patient. Proceed with cycle 4 CT image results were reviewed with patient. Good partial response. There is plan of debulking surgery followed by    Gigi risk score is 3, high risk of thrombosis.  Recommend Eliquis  2.5 mg twice daily for prophylaxis.    CKD (chronic kidney disease) stage 3, GFR 30-59 ml/min (HCC) Encouraged oral hydration, avoid nephrotoxins.   Depression Mood has been betters.  Discussed about option of decrease continue Celexa  to 20 mg daily.  EKG today showed normal QTc  Encounter for antineoplastic chemotherapy Chemotherapy treatments as listed above.   Orders Placed This Encounter  Procedures   EKG 12-Lead    Standing Status:   Future    Number of Occurrences:   1    Expected Date:   12/12/2023     Expiration Date:   03/11/2024   Follow-up TBD  All questions were answered. The patient knows to call the clinic with any problems, questions or concerns.  Call Babara, MD, PhD [facility] Hematology Oncology 12/12/2023   HISTORY OF PRESENTING ILLNESS:   Beth Hart is a  73 y.o.  female with PMH listed below presents for follow up of stage IV ovarian high grade serous carcinoma.  Oncology History  Peritoneal carcinomatosis (HCC)  09/18/2023 Initial Diagnosis   Peritoneal carcinomatosis (HCC)   10/10/2023 -  Chemotherapy   Patient is on Treatment Plan : OVARIAN Carboplatin  (AUC 6) + Paclitaxel  (175) q21d X 6 Cycles      Genetic Testing   Negative genetic testing. No pathogenic variants identified on the [facility] CancerNext+RNA panel. The report date is 10/20/2023.  The [facility] CancerNext+RNAinsight Panel includes sequencing, rearrangement analysis, and RNA analysis for the following 40 genes: APC, ATM, BAP1, BARD1, BMPR1A, BRCA1, BRCA2, BRIP1, CDH1, CDKN2A, CHEK2, FH, FLCN, MET, MLH1, MSH2, MSH6, MUTYH, NF1, NTHL1, PALB2, PMS2, PTEN, RAD51C, RAD51D, RPS20, SMAD4, STK11, TP53, TSC1, TSC2, and VHL (sequencing and deletion/duplication); AXIN2, HOXB13, MBD4, MSH3, POLD1 and POLE (sequencing only); EPCAM and GREM1 (deletion/duplication only).   Ovarian cancer (HCC)  09/25/2023 Cancer Staging   Staging form: Ovary, Fallopian Tube, and Primary Peritoneal Carcinoma, AJCC 8th Edition - Clinical stage from 09/25/2023: FIGO Stage IV (cT3c, cN1b, cM1) - Signed by Babara Call, MD on 10/05/2023 Stage prefix: Initial diagnosis   10/04/2023 Initial Diagnosis   Ovarian cancer  She experiences pressure and sharp pain in her abdomen, initially attributing it to a pulled muscle from yard work. The pain persisted and was accompanied by  a fever, prompting her to visit the ER on 09/16/2023. The fever was around 100F at home and remained at that level upon arrival at the ER. She has a persistent cough, especially when  lying down.   09/16/2023, CT abdomen pelvis with contrast showed extensive omental and peritoneal carcinomatosis with small to moderate ascites. multiple hypoattenuating masses in the liver and spleen, compatible with metastases. There are also multiple mildly enlarged retroperitoneal lymph nodes, also highly concerning for metastases.  09/24/2023, cytology from peritoneal ascites showed negative for malignancy. 09/25/2023, endometrial biopsy showed no malignancy. 09/26/2023 Patient underwent peritoneal mass biopsy,  1. Peritoneum, biopsy, RUQ mass :       - METASTATIC HIGH-GRADE SEROUS CARCINOMA WITH NECROSIS, SEE NOTE   Diagnosis Note : Immunohistochemical stains show that the tumor cells are positive for PAX8, p16 and p53 (clonal overexpression pattern) while they are negative for p63 and CK5/6.  This immunoprofile is consistent with above interpretation.       10/10/2023 -  Chemotherapy   Patient is on Treatment Plan : OVARIAN Carboplatin  (AUC 6) + Paclitaxel  (175) q21d X 6 Cycles      Imaging   CT chest w contrast  Several scattered sub 5 mm bilateral lung nodules identified. Based on appearance and other findings these are worrisome for early lung metastases. Few enlarged pre cardiophrenic mediastinal nodes as well as left internal mammary chain lymph nodes also worrisome for spread of disease.Tiny left pleural effusion.   Please correlate with previous abdomen pelvis CT describing abdominal findings.      Genetic Testing   Negative genetic testing. No pathogenic variants identified on the [facility] CancerNext+RNA panel. The report date is 10/20/2023.  The [facility] CancerNext+RNAinsight Panel includes sequencing, rearrangement analysis, and RNA analysis for the following 40 genes: APC, ATM, BAP1, BARD1, BMPR1A, BRCA1, BRCA2, BRIP1, CDH1, CDKN2A, CHEK2, FH, FLCN, MET, MLH1, MSH2, MSH6, MUTYH, NF1, NTHL1, PALB2, PMS2, PTEN, RAD51C, RAD51D, RPS20, SMAD4, STK11, TP53, TSC1, TSC2, and VHL  (sequencing and deletion/duplication); AXIN2, HOXB13, MBD4, MSH3, POLD1 and POLE (sequencing only); EPCAM and GREM1 (deletion/duplication only).    Her family history includes father who had lung cancer attributed to smoking, but no history of colon, ovarian, or breast cancer.  She underwent a colonoscopy a few years ago, which was reported to be negative.  And a mammogram six months ago, which was negative she does not recall any recent stomach pain, dysphagia, or acid reflux.  INTERVAL HISTORY Beth Hart is a 73 y.o. female who has above history reviewed by me today presents for follow up visit for Stage IV ovarian cancer.  She tolerated with  dose reduced chemotherapy well. Some fatigue for 4-5 days after chemotherapy. And then feels better afterwards. Mood is better she takes Celexa  40mg  daily.    MEDICAL HISTORY:  Past Medical History:  Diagnosis Date   Anemia    Arthritis    Asthma    as a child   Chicken pox    Colon polyps 2012   Depression    Hemorrhoids    Ovarian cancer (HCC)     SURGICAL HISTORY: Past Surgical History:  Procedure Laterality Date   BREAST CYST ASPIRATION Left 90s   BREAST SURGERY  2007   aspiration   CARPAL TUNNEL RELEASE  2010   REFRACTIVE SURGERY     TONSILLECTOMY      SOCIAL HISTORY: Social History   Socioeconomic History   Marital status: Married    Spouse name: Not on file  Number of children: Not on file   Years of education: Not on file   Highest education level: Associate degree: occupational, technical, or vocational program  Occupational History   Not on file  Tobacco Use   Smoking status: Never   Smokeless tobacco: Never  Vaping Use   Vaping status: Never Used  Substance and Sexual Activity   Alcohol use: No   Drug use: No   Sexual activity: Yes  Other Topics Concern   Not on file  Social History Narrative   married   Social Drivers of Corporate investment banker Strain: Low Risk  (11/03/2023)   Overall  Financial Resource Strain (CARDIA)    Difficulty of Paying Living Expenses: Not hard at all  Food Insecurity: No Food Insecurity (11/03/2023)   Hunger Vital Sign    Worried About Running Out of Food in the Last Year: Never true    Ran Out of Food in the Last Year: Never true  Transportation Needs: No Transportation Needs (11/03/2023)   PRAPARE - Administrator, Civil Service (Medical): No    Lack of Transportation (Non-Medical): No  Physical Activity: Inactive (11/03/2023)   Exercise Vital Sign    Days of Exercise per Week: 0 days    Minutes of Exercise per Session: Not on file  Stress: No Stress Concern Present (11/03/2023)   [employer] of Occupational Health - Occupational Stress Questionnaire    Feeling of Stress: Only a little  Social Connections: Socially Integrated (11/03/2023)   Social Connection and Isolation Panel    Frequency of Communication with Friends and Family: More than three times a week    Frequency of Social Gatherings with Friends and Family: Once a week    Attends Religious Services: More than 4 times per year    Active Member of [employer] or Organizations: Yes    Attends Engineer, structural: More than 4 times per year    Marital Status: Married  Catering manager Violence: Not At Risk (09/25/2023)   Humiliation, Afraid, Rape, and Kick questionnaire    Fear of Current or Ex-Partner: No    Emotionally Abused: No    Physically Abused: No    Sexually Abused: No    FAMILY HISTORY: Family History  Problem Relation Age of Onset   Alzheimer's disease Mother    Lung cancer Father    Prostate cancer Maternal Uncle    Prostate cancer Maternal Uncle    Prostate cancer Maternal Uncle    Prostate cancer Maternal Uncle    Prostate cancer Maternal Uncle    Prostate cancer Other        in mat great uncles, unk#   Prostate cancer Cousin        in 4-5 mat  first cousins   Breast cancer Neg Hx     ALLERGIES:  has no known allergies.  MEDICATIONS:   Current Outpatient Medications  Medication Sig Dispense Refill   citalopram  (CELEXA ) 40 MG tablet Take 1 tablet (40 mg total) by mouth daily. 30 tablet 3   dexamethasone  (DECADRON ) 4 MG tablet Take 2 tablets (8mg ) by mouth daily starting the day after carboplatin  for 3 days. Take with food 30 tablet 1   lactulose  (CHRONULAC ) 10 GM/15ML solution take15 to 30 ml 3 times a day when needed for constipation 236 mL 0   nystatin  (MYCOSTATIN ) 100000 UNIT/ML suspension Take 5 mLs (500,000 Units total) by mouth 4 (four) times daily. Swish and swallow 60 mL 0  omeprazole  (PRILOSEC) 20 MG capsule Take 1 capsule (20 mg total) by mouth daily. 30 capsule 0   ondansetron  (ZOFRAN ) 8 MG tablet Take 1 tablet (8 mg total) by mouth every 8 (eight) hours as needed for nausea or vomiting. Start on the third day after carboplatin . 30 tablet 1   oxyCODONE  (OXY IR/ROXICODONE ) 5 MG immediate release tablet Take 1 tablet (5 mg total) by mouth every 8 (eight) hours as needed for severe pain (pain score 7-10). 30 tablet 0   polyethylene glycol powder (GLYCOLAX/MIRALAX) 17 GM/SCOOP powder Take by mouth once.     senna (SENOKOT) 8.6 MG TABS tablet Take 1 tablet (8.6 mg total) by mouth daily as needed for mild constipation. 120 tablet 0   prochlorperazine  (COMPAZINE ) 10 MG tablet Take 1 tablet (10 mg total) by mouth every 6 (six) hours as needed for nausea or vomiting. (Patient not taking: Reported on 12/12/2023) 30 tablet 1   No current facility-administered medications for this visit.   Facility-Administered Medications Ordered in Other Visits  Medication Dose Route Frequency Provider Last Rate Last Admin   0.9 %  sodium chloride  infusion   Intravenous Continuous Babara Call, MD 10 mL/hr at 12/12/23 0926 New Bag at 12/12/23 0926   CARBOplatin  (PARAPLATIN ) 360 mg in sodium chloride  0.9 % 100 mL chemo infusion  360 mg Intravenous Once Babara Call, MD       heparin  lock flush 100 unit/mL  500 Units Intravenous Once Babara Call, MD        PACLitaxel  (TAXOL ) 240 mg in sodium chloride  0.9 % 250 mL chemo infusion (> 80mg /m2)  135 mg/m2 (Treatment Plan Recorded) Intravenous Once Babara Call, MD 97 mL/hr at 12/12/23 1039 240 mg at 12/12/23 1039    Review of Systems  Constitutional:  Positive for fatigue. Negative for appetite change, chills, fever and unexpected weight change.  HENT:   Negative for hearing loss and voice change.   Eyes:  Negative for eye problems.  Respiratory:  Negative for chest tightness and cough.   Cardiovascular:  Negative for chest pain.  Gastrointestinal:  Negative for abdominal distention, abdominal pain, blood in stool, nausea and vomiting.  Endocrine: Negative for hot flashes.  Genitourinary:  Negative for difficulty urinating and frequency.   Musculoskeletal:  Negative for arthralgias.  Skin:  Negative for itching and rash.  Neurological:  Negative for extremity weakness.  Hematological:  Negative for adenopathy.  Psychiatric/Behavioral:  Positive for depression and sleep disturbance. Negative for confusion.    PHYSICAL EXAMINATION: ECOG PERFORMANCE STATUS: 1 - Symptomatic but completely ambulatory Vitals:   12/12/23 0833  BP: 137/76  Pulse: 79  Resp: 18  Temp: (!) 97.2 F (36.2 C)  SpO2: 98%   Filed Weights   12/12/23 0833  Weight: 158 lb 4.8 oz (71.8 kg)    Physical Exam Constitutional:      General: She is not in acute distress. HENT:     Head: Normocephalic and atraumatic.  Eyes:     General: No scleral icterus. Cardiovascular:     Rate and Rhythm: Normal rate and regular rhythm.     Heart sounds: Normal heart sounds.  Pulmonary:     Effort: Pulmonary effort is normal. No respiratory distress.     Breath sounds: No wheezing.  Abdominal:     General: Bowel sounds are normal. There is no distension.     Palpations: Abdomen is soft.  Musculoskeletal:        General: No deformity. Normal range of motion.  Cervical back: Normal range of motion and neck supple.  Skin:     General: Skin is warm and dry.     Findings: No erythema or rash.  Neurological:     Mental Status: She is alert and oriented to person, place, and time. Mental status is at baseline.  Psychiatric:        Mood and Affect: Mood normal.     LABORATORY DATA:  I have reviewed the data as listed    Latest Ref Rng & Units 12/12/2023    8:19 AM 11/21/2023    8:26 AM 10/31/2023    8:05 AM  CBC  WBC 4.0 - 10.5 K/uL 5.8  4.4  6.6   Hemoglobin 12.0 - 15.0 g/dL 89.2  89.6  89.3   Hematocrit 36.0 - 46.0 % 33.3  32.3  33.0   Platelets 150 - 400 K/uL 141  216  199       Latest Ref Rng & Units 12/12/2023    8:19 AM 12/02/2023    2:17 PM 11/21/2023    8:26 AM  CMP  Glucose 70 - 99 mg/dL 887   866   BUN 8 - 23 mg/dL 36   29   Creatinine 9.55 - 1.00 mg/dL 8.77  8.79  8.96   Sodium 135 - 145 mmol/L 136   136   Potassium 3.5 - 5.1 mmol/L 4.6   4.5   Chloride 98 - 111 mmol/L 105   104   CO2 22 - 32 mmol/L 23   24   Calcium 8.9 - 10.3 mg/dL 9.4   9.1   Total Protein 6.5 - 8.1 g/dL 6.7   6.6   Total Bilirubin 0.0 - 1.2 mg/dL 0.5   0.5   Alkaline Phos 38 - 126 U/L 70   84   AST 15 - 41 U/L 26   32   ALT 0 - 44 U/L 22   27       RADIOGRAPHIC STUDIES: I have personally reviewed the radiological images as listed and agreed with the findings in the report. CT CHEST ABDOMEN PELVIS W CONTRAST Result Date: 12/11/2023 EXAM: CT CHEST, ABDOMEN AND PELVIS WITH CONTRAST 12/02/2023 02:33:35 PM TECHNIQUE: CT of the chest, abdomen and pelvis was performed with the administration of intravenous contrast. Multiplanar reformatted images are provided for review. Automated exposure control, iterative reconstruction, and/or weight based adjustment of the mA/kV was utilized to reduce the radiation dose to as low as reasonably achievable. COMPARISON: CT chest from 10/03/2023 and CT abdomen and pelvis from 09/16/2023. CLINICAL HISTORY: Ovarian cancer. 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 FINDINGS: CHEST: MEDIASTINUM: Heart and pericardium are unremarkable. The central airways are clear. THORACIC LYMPH NODES: No mediastinal, hilar or axillary lymphadenopathy. LUNGS AND PLEURA: Right lower lobe lung nodule measures 3 mm, image 64/4, previously 5 mm. The previous 4 mm right lower lobe lung nodule has resolved in the interval. Previous 4 mm nodule within the lower right lower lobe measures 2 mm, image 92/4, previously 4 mm. Calcified granuloma identified within the left upper lobe. No pleural effusion or pneumothorax. ABDOMEN AND PELVIS: LIVER: Unchanged cyst along  the dome of left lobe of liver measuring 2.3 cm, image 46/2. Inferior right lobe of liver metastasis measures 3.5 x 2.2 cm, image 59/2, previously 4.5 x 3.5 cm. Index lesion within segment 5 measures 1.3 x 0.9 cm, image 59/2, previously 1.6 x 1.1 cm. No new liver lesions. GALLBLADDER AND BILE DUCTS: Gallstones are again noted. No biliary ductal dilatation. Common bile duct is upper limits of normal measuring 6 mm, similar to previous exam. SPLEEN: Multiple splenic metastases. Index lesion within the posterior lateral spleen measures 2.4 x 1.6 cm, image 55/2, previously 3.2 x 2.0 cm. Inferior splenic lesion measures 3.1 x 2.5 cm, previously this measured the same. PANCREAS: No pancreatic inflammation, main duct dilatation or mass. ADRENAL GLANDS: Normal adrenal glands. KIDNEYS, URETERS AND BLADDER: Bilateral renal cortical scarring and volume loss noted. No nephrolithiasis, hydronephrosis or suspicious mass. No bladder abnormality. GI AND BOWEL: No  signs of bowel obstruction. Moderate stool burden noted throughout the colon. PERITONEUM AND RETROPERITONEUM: Peritoneal metastasis again noted. Index lesion within the left hemiabdomen along the undersurface of the ventral abdominal wall measures 2.6 x 1.6 cm, image 75/2, on the previous exam this measured 4.3 x 2.6 cm. At a slightly higher level peritoneal nodule measures 2.2 x 1.8 cm, image 70/2, previously 3.1 x 2.6 cm. Soft tissue nodularity along the undersurface of both hemidiaphragms is also improved in the interval. Left upper quadrant peritoneal nodule measures 2.5 x 1.8 cm, image 63/2, on the previous exam is measured 3.3 x 2.4 cm. Interval resolution of previously noted ascites. VASCULATURE: Aorta is normal in caliber. ABDOMINAL AND PELVIS LYMPH NODES: Abdominal pelvic adenopathy is again noted. Index aortocaval lymph node measures 1 cm short axis, image 77/2, previously 1.1 cm. Index lymph node left periaortic lymph node measures 1.4 cm, image 60/2, previously 1.5 cm. Left common iliac lymph node measures 0.9 cm, image 84/2, previously 1.2 cm. Right external iliac node measures 0.9 cm, image 101/2, previously 1 cm. REPRODUCTIVE ORGANS: No focal abnormality involving the uterus. BONES AND SOFT TISSUES: No acute or suspicious osseous findings. IMPRESSION: 1. Interval improvement in peritoneal metastases, splenic metastases, and liver metastases. 2. Interval resolution of previously noted ascites. 3. Abdominal pelvic adenopathy, improved in the interval. 4. Interval improvement in right lower lobe lung nodules. Electronically signed by: Waddell Calk MD 12/11/2023 09:22 AM EDT RP Workstation: [id number]   CT Chest W Contrast Result Date: 10/09/2023 CLINICAL DATA:  Peritoneal carcinomatosis. Staging. * Tracking Code: BO * EXAM: CT CHEST WITH CONTRAST TECHNIQUE: Multidetector CT imaging of the chest was performed during intravenous contrast administration. RADIATION DOSE REDUCTION: This exam was performed  according to the departmental dose-optimization program which includes automated exposure control, adjustment of the mA and/or kV according to patient size and/or use of iterative reconstruction technique. CONTRAST:  60mL OMNIPAQUE  IOHEXOL  300 MG/ML  SOLN COMPARISON:  Abdomen pelvis CT 09/16/2023. FINDINGS: Cardiovascular: Heart is nonenlarged. Trace pericardial fluid or thickening. The thoracic aorta is normal course and caliber. Mild atherosclerotic changes. Minimal coronary calcifications are seen. Mediastinum/Nodes: Preserved thyroid  gland. The thoracic esophagus has a normal course and caliber. No specific abnormal lymph node enlargement identified in the axillary region or hila. There are a few left internal mammary chain nodes identified such as image 50 and 63. The larger focus on image 63 measures 6 x 11 mm, abnormal for a node in this location. Small pre cardiophrenic nodes identified such as image 95 on the right side and image 102. Lesion on image 102 measures 15 x  7 mm. Abnormal for node in this location. Small subcarinal and paraesophageal nodes identified, less than a cm in short axis. Lungs/Pleura: There is some linear opacity lung bases likely scar or atelectasis. No consolidation, pneumothorax. Trace left-sided pleural fluid. There are several tiny lung nodules identified. Example right lower lobe measures 4 mm on series 4, image 76. Example right lower lobe image 65 measures 4 mm. Slightly larger focus superior segment right lower lobe on image 51 of series 4 measuring 5 mm. There are a few on the left side as well. Gallbladder calcified but the vast majority are noncalcified. Up to 20 nodules are seen in the right lung and and 15 on the left. Although these are small, with the abdominal findings are worrisome for early lung metastases. Upper Abdomen: Ascites identified liver and splenic masses. Gallstones. Please see recent abdomen and pelvis CT scan. Musculoskeletal: Scattered degenerative  changes along the spine. Partial fusion of the vertebral bodies at T3 and T4 which trace listhesis of T3 on T4. Findings are likely congenital. IMPRESSION: Several scattered sub 5 mm bilateral lung nodules identified. Based on appearance and other findings these are worrisome for early lung metastases. Few enlarged pre cardiophrenic mediastinal nodes as well as left internal mammary chain lymph nodes also worrisome for spread of disease. Tiny left pleural effusion. Please correlate with previous abdomen pelvis CT describing abdominal findings. Aortic Atherosclerosis (ICD10-I70.0). Electronically Signed   By: Ranell Bring M.D.   On: 10/09/2023 14:23   CT ABDOMINAL MASS BIOPSY Result Date: 09/26/2023 INDICATION: 72 year old female with peritoneal carcinomatosis, presents for biopsy EXAM: CT BIOPSY MEDICATIONS: None. ANESTHESIA/SEDATION: Moderate (conscious) sedation was employed during this procedure. A total of Versed  1.0 mg and Fentanyl  50 mcg was administered intravenously. Moderate Sedation Time: 20 minutes. The patient's level of consciousness and vital signs were monitored continuously by radiology nursing throughout the procedure under my direct supervision. FLUOROSCOPY TIME:  CT COMPLICATIONS: None PROCEDURE: Informed written consent was obtained from the patient after a thorough discussion of the procedural risks, benefits and alternatives. All questions were addressed. Maximal Sterile Barrier Technique was utilized including caps, mask, sterile gowns, sterile gloves, sterile drape, hand hygiene and skin antiseptic. A timeout was performed prior to the initiation of the procedure. Patient positioned supine on the CT gantry table. Scout CT acquired for planning purposes. The patient is prepped and draped in the usual sterile fashion. 1% lidocaine  was used for local anesthesia. Using CT guidance, introducer needle was advanced into the targeted mass in the left upper quadrant from an anterior approach. Once  we confirmed needle tip position multiple 18 gauge core biopsy were acquired. Specimen placed into formalin. Needle was removed and a final CT was performed. Patient tolerated the procedure well and remained hemodynamically stable throughout. No complications were encountered and no significant blood loss. IMPRESSION: Status post CT-guided biopsy of left upper quadrant peritoneal mass. Signed, Ami RAMAN. Alona ROSALEA GRAVER, RPVI [facility] Electronically Signed   By: Ami Alona D.O.   On: 09/26/2023 12:11   US  Paracentesis Result Date: 09/24/2023 INDICATION: 73 year old female. History of peritoneal carcinomatosis with recurrent ascites. Request is for therapeutic and diagnostic paracentesis EXAM: ULTRASOUND GUIDED THERAPEUTIC AND DIAGNOSTIC LEFT-SIDED PARACENTESIS MEDICATIONS: lidocaine  1% 10 mL COMPLICATIONS: None immediate. PROCEDURE: Informed written consent was obtained from the patient after a discussion of the risks, benefits and alternatives to treatment. A timeout was performed prior to the initiation of the procedure. Initial ultrasound scanning demonstrates a small  amount of ascites within the left lower abdominal quadrant. The left lower abdomen was prepped and draped in the usual sterile fashion. 1% lidocaine  was used for local anesthesia. Following this, a 19 gauge, 7-cm, Yueh catheter was introduced. An ultrasound image was saved for documentation purposes. The paracentesis was performed. The catheter was removed and a dressing was applied. The patient tolerated the procedure well without immediate post procedural complication. FINDINGS: A total of approximately 2.5 L of straw-colored fluid was removed. Samples were sent to the laboratory as requested by the clinical team. IMPRESSION: Successful ultrasound-guided therapeutic and diagnostic left-sided paracentesis yielding 2 point liters of peritoneal fluid. Performed by Delon Beagle  NP Electronically Signed   By: Wilkie Lent M.D.   On: 09/24/2023 12:44   CT ABDOMEN PELVIS W CONTRAST Result Date: 09/16/2023 CLINICAL DATA:  Abdominal pain, acute, nonlocalized. * Tracking Code: BO * EXAM: CT ABDOMEN AND PELVIS WITH CONTRAST TECHNIQUE: Multidetector CT imaging of the abdomen and pelvis was performed using the standard protocol following bolus administration of intravenous contrast. RADIATION DOSE REDUCTION: This exam was performed according to the departmental dose-optimization program which includes automated exposure control, adjustment of the mA and/or kV according to patient size and/or use of iterative reconstruction technique. CONTRAST:  OMNIPAQUE  IOHEXOL  300 MG/ML  SOLN COMPARISON:  None Available. FINDINGS: Lower chest: There are at least 3, sub 4 mm, solid noncalcified nodules in the visualized bilateral lungs with largest measuring up to 3.4 x 4.0 mm in the right lung lower lobe (series 4, image 1), partially seen. Please see follow-up recommendations below. There are patchy atelectatic changes in the visualized lung bases. No overt consolidation. No pleural effusion. The heart is normal in size. No pericardial effusion. Hepatobiliary: The liver is normal in size. Non-cirrhotic configuration. There are at least 3, hypoattenuating masses in the right hepatic lobe with largest in the segment 6, subcapsular location measuring 3.5 x 4.5 cm, compatible with metastases. There also ill-defined is similar characteristic hypoattenuating areas surrounding the liver along the region of caudate lobe, highly concerning for serosal implants. There is an additional 2.1 x 2.2 cm hypoattenuating lesion in the left hepatic lobe, segments 2/4 a, which may represent a cyst. No intrahepatic or extrahepatic bile duct dilation. Small volume dependent calcified gallstones noted without imaging signs of acute cholecystitis. There is focal wall thickening near the fundus of the gallbladder, most  likely secondary to underlying adenomyomatosis. Normal gallbladder wall thickness. No pericholecystic inflammatory changes. Pancreas: Small/atrophic pancreas. No discrete suspicious mass noted. Main pancreatic duct is not dilated. No peripancreatic fat stranding. Spleen: Normal size spleen. There are multiple hypoattenuating masses with largest along the lateral subcapsular area measuring 2.0 x 3.2 cm, highly concerning for metastases. Adrenals/Urinary Tract: Adrenal glands are unremarkable. Bilateral kidneys are small/atrophic and exhibits persistent fetal lobulations. No hydroureteronephrosis or nephroureterolithiasis. Unremarkable urinary bladder. Stomach/Bowel: No disproportionate dilation of the small or large bowel loops. No evidence of abnormal bowel wall thickening or inflammatory changes. The appendix was not distinctly separately visualized; however there is no acute inflammatory process in the right lower quadrant. Vascular/Lymphatic: There is small to moderate ascites. There are extensive centrally necrotic omental implants as well as multiple irregular hyperattenuating nodules along the peritoneal reflections, compatible with extensive peritoneal carcinomatosis. There multiple mildly enlarged retroperitoneal lymph nodes (for example, series 2, images 36, 41, 48, 55, etc.), with largest lymph node measuring up to 1.1 x 1.8 cm, also highly concerning for metastases. 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 Reproductive:  Limited evaluation on CT scan exam. Normal-size anteverted uterus noted. 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 There is mixed solid/cystic appearance of bilateral ovaries, which may be due to drop metastasis along the surface. However, correlate clinically and with tumor markers to determine the need for additional imaging with contrast-enhanced MRI pelvis. Other: There are tumor deposit along the  undersurface of the bilateral hemidiaphragm, right more than left. There are small fat containing umbilical and right inguinal hernias. The soft tissues and abdominal wall are otherwise unremarkable. Musculoskeletal: No suspicious osseous lesions. There are mild multilevel degenerative changes in the visualized spine. IMPRESSION: 1. There are findings compatible with extensive omental and peritoneal carcinomatosis with small to moderate ascites. There are multiple hypoattenuating masses in the liver and spleen, compatible with metastases. There are also multiple mildly enlarged retroperitoneal lymph nodes, also highly concerning for metastases. 2. There is mixed solid/cystic appearance of bilateral ovaries, which may be due to drop metastasis along the surface. However, correlate clinically and with tumor markers to determine the need for additional imaging with contrast-enhanced MRI pelvis. 3. There are at least 3, sub 4 mm, solid noncalcified nodules in the visualized bilateral lungs with largest measuring up to 3.4 x 4.0 mm in the right lung lower lobe, partially seen. In the given clinical context, these are also highly concerning for metastases. Consider further evaluation with dedicated chest CT and establish a baseline. 4. Multiple other nonacute observations, as described above. Electronically Signed   By: Ree Molt M.D.   On: 09/16/2023 12:10

## 2023-12-13 LAB — CA 125: Cancer Antigen (CA) 125: 187 U/mL — ABNORMAL HIGH (ref 0.0–38.1)

## 2023-12-16 ENCOUNTER — Encounter: Payer: Self-pay | Admitting: Oncology

## 2023-12-16 NOTE — Progress Notes (Signed)
 [facility] - Medication Recommendation  Date of Service: Nov 19 2023  Time of Service: 10:49 AM   Note to Referring Specialist:   Dear Referring Specialist:   Thank you for referring your patient to [facility]. Below you will find my new psychiatric medication  recommendations.   Once reviewed, please confirm prescription(s) within 2 business days. If you modify or decline the  recommendations, please document these modifications and notify us . If you have any questions or concerns,  please call our [facility] provider line at [phone].  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 This is an FDA warning. In addition, she is prescribed odansetron and compazine  which  can also prolong QTc, making the need for the ECG even more relevant.    Coaching and Care Management:  It is recommended that the Member engage in [facility] Coaching and [facility]  Manager Visits, approximately once a week, alongside psychiatric treatment.

## 2023-12-16 NOTE — Progress Notes (Signed)
 [facility] - IDT Note Date of Service: Nov 19 2023 Time of Service: 7:24 AM  Name Beth Hart  Date of Birth Dec 05, 1950  Final Intake Summary [name] is a 75 white heterosexual year old female diagnosed with C56.9 Malignant neoplasm of unspecified ovary , referred by Fonda Mower at [facility] for depression and wanting to stop treatments due to side effects but since that time is not wanting to discontinue treatment due to better symptom management. Member is not open to psychiatric medication recommendations. She feels her Celexa  40mg s QD is working well. Assessments: [name] scored 1 Minimal on the PHQ-9 reporting symptoms of fatigue related to treatment Safety Concerns: None [name] scored 0 Minimal on the GAD-7 reporting symptoms of nothing Psych Tx/Hx: [name] currently takes Celexa  40mg s QD, prescribed by Fonda Mower for depression, previously took 10mg s QD but increased due to diagnosis. She is adherent, positive experience, and no side effects. Psychosocial Hx: Lives with her husband, two dogs, and has a great support system of family and friends. Has hobbies of yard word and gardening. Somatic Symptoms: Fatigue and Nausea post treatments for that begin around 3-6 days post chemo.

## 2023-12-20 NOTE — Progress Notes (Signed)
 Gynecologic Oncology New Visit   Referring Provider: Dr. Maryelizabeth Cap  Chief Concern: HGSOC, consideration of interval debulking surgery  Subjective:  Beth [facility] is a 73 y.o. with a past medical history significant for CKDIII, depression, and FIGO Stage IV HGSOC, who is seen for evaluation for interval debulking for high grade serous, likely-ovarian cancer.  She was diagnosed with Stage IV likely HGSOC in 09/18/23 after presenting to the ED for abdominal pain. CT Scan demonstrated peritoneal carcinomatosis, ascites, enlarged ovaries, and nodal metastases as well as metastatic disease in the spleen, liver, and lungs. CA-125 and CA27.29 elevated to 609 and 415.6, respectively; CEA WNL. EMB (09/25/23) was benign. CT guided peritoneal biopsy (09/26/23) revealed METASTATIC HIGH-GRADE SEROUS CARCINOMA WITH NECROSIS, tumor cells are positive for PAX8, p16 and p53.  She was seen by Dr. Cap, (heme/onc) at [facility] and recommended for NACT with carboplatin /paclitaxel  followed by consideration for interval debulking. Interval CT after 3C (12/02/23) revealed partial response though still with parenchymal liver disease. CA-125 has downtrended from 609 to 187 (12/12/23). She received C4 (dose reduced) on 12/12/23 and has been doing well since then. She reports some fatigue, which is normal for her after chemotherapy.   She is here today for discussion of interval debulking surgery. She has no current concerns or complaints.   Do any family members have a history of DVT/PE: no. She was prescribed eliquis  by Dr. Cap, but has never taken it, as Dr. Cap called her and told her she didn't need it anymore.  Genetic Risk Assessment:  Ambry panel testing negative for germline mutations. [facility] My[facility] shows HRD but no somatic BRCA mutation   Molecular Tumor testing: Testing will be determined following final pathology results.  Problem List: Patient Active Problem List  Diagnosis  . CKD (chronic kidney disease)  stage 3, GFR 30-59 ml/min (CMS/HHS-HCC)  . Depression  . Hyperlipidemia  . Ovarian cancer (CMS/HHS-HCC)    Past Medical History: No past medical history on file.  Past Surgical History: Past Surgical History:  Procedure Laterality Date  . COLONOSCOPY  03/09/2003   Adenomatous Polyps  . COLONOSCOPY  04/04/2017   PH Adenomatous Polyps: CBF 03/2022  . Right Trigger Finger Release Right 05/13/2019   Dr. Kathlynn  . COLONOSCOPY  03/29/2022   PHx CP/NoRpt/CTL  . COLONOSCOPY  02/21/2012, 09/27/2006   PH Adenomatous Polyps: CBF 01/2017; Recall Ltr mailed 01/11/2017 (dw)  . ENDOSCOPIC CARPAL TUNNEL RELEASE    . TONSILLECTOMY    . trigger thumb     . wisdom teeth removed       Past Gynecologic History:   Menarche: 14 Last Menstrual Period: 2002  History of OCP/HRT use: Never  History of Abnormal pap: no,  Last pap: 8-9 years ago  Contraception: post menopausal status  Sexually active: yes  OB History:  OB History  No obstetric history on file.  G1P1  Family History: Family History  Problem Relation Age of Onset  . Lung cancer Father     Social History: Social History   Socioeconomic History  . Marital status: Married  Tobacco Use  . Smoking status: Never  . Smokeless tobacco: Never  Vaping Use  . Vaping status: Never Used  Substance and Sexual Activity  . Alcohol use: No  . Drug use: No  . Sexual activity: Defer   Social Drivers of Health   Financial Resource Strain: Low Risk  (11/03/2023)   Received from [facility]   Overall Financial Resource Strain (CARDIA)   .  How hard is it for you to pay for the very basics like food, housing, medical care, and heating?: Not hard at all  Food Insecurity: No Food Insecurity (11/03/2023)   Received from [facility]   Hunger Vital Sign   . Within the past 12 months, you worried that your food would run out before you got the money to buy more.: Never true   . Within the past 12 months, the food you bought just didn't last and  you didn't have money to get more.: Never true  Transportation Needs: No Transportation Needs (11/03/2023)   Received from [facility] - Transportation   . In the past 12 months, has lack of transportation kept you from medical appointments or from getting medications?: No   . In the past 12 months, has lack of transportation kept you from meetings, work, or from getting things needed for daily living?: No  Physical Activity: Inactive (11/03/2023)   Received from [facility]   Exercise Vital Sign   . On average, how many days per week do you engage in moderate to strenuous exercise (like a brisk walk)?: 0 days  Stress: No Stress Concern Present (11/03/2023)   Received from [facility] - Occupational Stress Questionnaire   . Do you feel stress - tense, restless, nervous, or anxious, or unable to sleep at night because your mind is troubled all the time - these days?: Only a little  Social Connections: Socially Integrated (11/03/2023)   Received from [facility]   Social Connection and Isolation Panel   . In a typical week, how many times do you talk on the phone with family, friends, or neighbors?: More than three times a week   . How often do you get together with friends or relatives?: Once a week   . How often do you attend church or religious services?: More than 4 times per year   . Do you belong to any clubs or organizations such as church groups, unions, fraternal or athletic groups, or school groups?: Yes   . How often do you attend meetings of the clubs or organizations you belong to?: More than 4 times per year   . Are you married, widowed, divorced, separated, never married, or living with a partner?: Married  Housing Stability: Unknown (12/19/2023)   Housing Stability Vital Sign   . Homeless in the Last Year: No    Allergies: No Known Allergies  Current Medications: Current Outpatient Medications  Medication Sig Dispense Refill   . acetaminophen  (TYLENOL ) 650 MG ER tablet Take 1,300 mg by mouth as needed for Pain    . citalopram  (CELEXA ) 40 MG tablet Take 40 mg by mouth once daily    . dexAMETHasone  (DECADRON ) 4 MG tablet Take 2 tablets (8mg ) by mouth daily starting the day after carboplatin  for 3 days. Take with food    . lactulose  (ENULOSE ) 10 gram/15 mL oral solution take15 to 30 ml 3 times a day when needed for constipation    . ondansetron  (ZOFRAN ) 8 MG tablet Take 8 mg by mouth    . polyethylene glycol (MIRALAX) powder Take by mouth once    . prochlorperazine  (COMPAZINE ) 10 MG tablet Take 10 mg by mouth every 6 (six) hours as needed for Nausea or Vomiting    . celecoxib (CELEBREX) 200 MG capsule Take 1 capsule (200 mg total) by mouth 2 (two) times daily (Patient not taking: Reported on 12/20/2023) 60  capsule 0  . cholecalciferol (VITAMIN D3) 1000 unit capsule Take by mouth Take 2,000 Units by mouth 1 (one) time each day (Patient not taking: Reported on 12/20/2023)    . citalopram  (CELEXA ) 20 MG tablet Take 0.5 tablets by mouth once daily. (Patient not taking: Reported on 12/20/2023)    . metroNIDAZOLE (FLAGYL) 500 MG tablet Take 1 tablet (500 mg total) by mouth as directed for up to 3 doses 1 tablet at 3 pm, 6 pm, and 10 pm the day before scheduled surgery (Monday 01/06/24) 3 tablet 0  . multivitamin tablet Take 1 tablet by mouth once daily. (Patient not taking: Reported on 12/20/2023)    . neomycin 500 mg tablet 2 tablets at 3pm. 2 tablets at 4 pm. 2 tablets at 10 pm the day before scheduled surgery (Monday 01/06/24) 6 tablet 0  . peg-electrolyte (NULYTELY) solution Take 4,000 mLs by mouth as directed (Patient not taking: Reported on 12/20/2023) 4000 mL 0   No current facility-administered medications for this visit.    Review of Systems As per history, otherwise 12 point review of systems negative.    Objective:  Physical Examination:  BP 133/79 (BP Location: Right upper arm, Patient Position: Sitting, BP Cuff Size:  Adult)   Pulse 79   Temp 36.4 C (97.5 F) (Oral)   Resp 16   Ht 156 cm (5' 1.42)   Wt 72.2 kg (159 lb 2.8 oz)   LMP  (LMP Unknown)   SpO2 99%   BMI 29.67 kg/m    ECOG Performance Status:0 Fully active; no performance restrictions  General Appearance / Mental Status: well appearing in no acute distress / normal mood and affect Cardiovascular: regular rate and rhythm Respiratory: clear to auscultation Gastrointestinal: soft, nontender, nondistended. No palpable masses. Bowel sounds present. Musculoskeletal: inspection of back is normal; no lower extremity edema  Pelvic exam:  Deferred due to recent pelvic exam last week by Dr. Mancil  Lab Review Lab Results  Component Value Date   WBC 9.3 12/05/2016   HGB 12.5 12/05/2016   HCT 38.1 12/05/2016   PLT 246 12/05/2016   MCV 87.2 12/05/2016   MCH 28.6 12/05/2016   MCHC 32.8 12/05/2016   RBC 4.37 12/05/2016   MPV 11.3 12/05/2016   RDWCV 16.1 (H) 12/05/2016   Lab Results  Component Value Date   NEUTCT 5.95 12/05/2016    12/12/23 CMP (Cancer Center only) Specimen: Blood Component Ref Range & Units 9 d ago Comments  Sodium 135 - 145 mmol/L 136   Potassium 3.5 - 5.1 mmol/L 4.6   Chloride 98 - 111 mmol/L 105   CO2 22 - 32 mmol/L 23   Glucose, Bld 70 - 99 mg/dL 887 High  Glucose reference range applies only to samples taken after fasting for at least 8 hours.  BUN 8 - 23 mg/dL 36 High    Creatinine 9.55 - 1.00 mg/dL 8.77 High    Calcium 8.9 - 10.3 mg/dL 9.4   Total Protein 6.5 - 8.1 g/dL 6.7   Albumin 3.5 - 5.0 g/dL 3.4 Low    AST 15 - 41 U/L 26   ALT 0 - 44 U/L 22   Alkaline Phosphatase 38 - 126 U/L 70   Total Bilirubin 0.0 - 1.2 mg/dL 0.5   GFR, Estimated >39 mL/min 47 Low  (NOTE) Calculated using the CKD-EPI Creatinine Equation (2021)  Anion gap 5 - 15 8     09/26/2023 Peritoneum, biopsy, RUQ mass :  METASTATIC HIGH-GRADE SEROUS CARCINOMA  WITH NECROSIS, SEE NOTE        Diagnosis Note :  Immunohistochemical stains show that the tumor cells are       positive for PAX8, p16 and p53 (clonal overexpression pattern) while they are       negative for p63 and CK5/6.  This immunoprofile is consistent with above       interpretation.  Dr. Janel  reviewed the case and concurs with the diagnosis.       Dr. Babara was notified on 10/02/2023.    Radiologic Imaging:   09/06/23 CT ABDOMEN AND PELVIS WITH CONTRAST 1. There are findings compatible with extensive omental and peritoneal carcinomatosis with small to moderate ascites. There are multiple hypoattenuating masses in the liver and spleen, compatible with metastases. There are also multiple mildly enlarged retroperitoneal lymph nodes, also highly concerning for metastases.  2. There is mixed solid/cystic appearance of bilateral ovaries, which may be due to drop metastasis along the surface. However, correlate clinically and with tumor markers to determine the need for additional imaging with contrast-enhanced MRI pelvis.  3. There are at least 3, sub 4 mm, solid noncalcified nodules in the visualized bilateral lungs with largest measuring up to 3.4 x 4.0 mm in the right lung lower lobe, partially seen. In the given clinical context, these are also highly concerning for metastases. Consider further evaluation with dedicated chest CT and establish a baseline.  4. Multiple other nonacute observations, as described above.   12/02/2023 CT CHEST, ABDOMEN AND PELVIS WITH CONTRAST  1. Interval improvement in peritoneal metastases, splenic metastases, and liver metastases.  2. Interval resolution of previously noted ascites.  3. Abdominal pelvic adenopathy, improved in the interval.  4. Interval improvement in right lower lobe lung nodules.   COMPARISON:  CT chest from 10/03/2023 and CT abdomen and pelvis from 09/16/2023.  FINDINGS:   CHEST:  MEDIASTINUM:  Heart and pericardium are unremarkable. The central airways are clear.   THORACIC LYMPH  NODES:  No mediastinal, hilar or axillary lymphadenopathy.   LUNGS AND PLEURA:  Right lower lobe lung nodule measures 3 mm, image 64/4, previously 5 mm. The  previous 4 mm right lower lobe lung nodule has resolved in the interval.  Previous 4 mm nodule within the lower right lower lobe measures 2 mm, image  92/4, previously 4 mm. Calcified granuloma identified within the left upper  lobe. No pleural effusion or pneumothorax.   ABDOMEN AND PELVIS:   LIVER:  Unchanged cyst along the dome of left lobe of liver measuring 2.3 cm, image  46/2. Inferior right lobe of liver metastasis measures 3.5 x 2.2 cm, image  59/2, previously 4.5 x 3.5 cm. Index lesion within segment 5 measures 1.3 x 0.9  cm, image 59/2, previously 1.6 x 1.1 cm. No new liver lesions.   GALLBLADDER AND BILE DUCTS:  Gallstones are again noted. No biliary ductal dilatation. Common bile duct is  upper limits of normal measuring 6 mm, similar to previous exam.   SPLEEN:  Multiple splenic metastases. Index lesion within the posterior lateral spleen  measures 2.4 x 1.6 cm, image 55/2, previously 3.2 x 2.0 cm. Inferior splenic  lesion measures 3.1 x 2.5 cm, previously this measured the same.   PANCREAS:  No pancreatic inflammation, main duct dilatation or mass.   ADRENAL GLANDS:  Normal adrenal glands.   KIDNEYS, URETERS AND BLADDER:  Bilateral renal cortical scarring and volume loss noted. No nephrolithiasis,  hydronephrosis or suspicious mass. No bladder abnormality.  GI AND BOWEL:  No signs of bowel obstruction. Moderate stool burden noted throughout the colon.   PERITONEUM AND RETROPERITONEUM:  Peritoneal metastasis again noted. Index lesion within the left hemiabdomen  along the undersurface of the ventral abdominal wall measures 2.6 x 1.6 cm,  image 75/2, on the previous exam this measured 4.3 x 2.6 cm. At a slightly  higher level peritoneal nodule measures 2.2 x 1.8 cm, image 70/2, previously  3.1 x 2.6 cm.  Soft tissue nodularity along the undersurface of both  hemidiaphragms is also improved in the interval. Left upper quadrant peritoneal  nodule measures 2.5 x 1.8 cm, image 63/2, on the previous exam is measured 3.3  x 2.4 cm. Interval resolution of previously noted ascites.   VASCULATURE:  Aorta is normal in caliber.   ABDOMINAL AND PELVIS LYMPH NODES:  Abdominal pelvic adenopathy is again noted. Index aortocaval lymph node  measures 1 cm short axis, image 77/2, previously 1.1 cm. Index lymph node left  periaortic lymph node measures 1.4 cm, image 60/2, previously 1.5 cm. Left  common iliac lymph node measures 0.9 cm, image 84/2, previously 1.2 cm. Right  external iliac node measures 0.9 cm, image 101/2, previously 1 cm.   REPRODUCTIVE ORGANS:  No focal abnormality involving the uterus.   BONES AND SOFT TISSUES:  No acute or suspicious osseous findings.   IMPRESSION:  1. Interval improvement in peritoneal metastases, splenic metastases, and liver  metastases.  2. Interval resolution of previously noted ascites.  3. Abdominal pelvic adenopathy, improved in the interval.  4. Interval improvement in right lower lobe lung nodules.   Assessment:  Beth Hart is a 73 y.o. diagnosed with FIGO Stage IV HGSOC who presents after C4 carbo/taxol  for consideration of interval debulking surgery. Patient has extensive metastatic disease with mets to the peritoneum, spleen, and liver, which has shown improvement after NACT. CA 125 improved to 187 from 609 at time of diagnosis. She is a good surgical candidate. Will plan for interval debulking on 01/07/24.  Medical co-morbidities complicating care: CKD Stage 3.  Plan:   Problem List Items Addressed This Visit       Oncology   Ovarian cancer (CMS/HHS-HCC) - Primary   Relevant Medications   dexAMETHasone  (DECADRON ) 4 MG tablet   prochlorperazine  (COMPAZINE ) 10 MG tablet   ondansetron  (ZOFRAN ) 8 MG tablet   We reviewed the available  medical records, available laboratory results, previous radiology studies, and prior treatment regimens   Do not recommend outside hospital pathology slides be submitted for [facility] review.   Discussed that she has had a significant drop in CA125 and improvement of carcinomatosis and other lesions on CT imaging.  She does still have a few liver nodules that are not amenable to resection, however, can benefit from debulking of peritoneal disease.  Lesions in spleen as well, but would probably not remove spleen in view of liver lesions.  Scheduled with Dr Mancil on 01/07/24 for EUA, diagnostic laparoscopy, interval debulking surgery (laparoscopic versus open) consisting of total laparoscopic hysterectomy with bilateral salpingo-oophorectomy versus exploratory laparotomy with total abdominal hysterectomy, bilateral salpingo-oophorectomy, omentectomy, debulking, possible hepatic resection, possible pelvic or para-aortic lymph node dissection, possible bowel resection, possible ostomy .  The risks of surgery were discussed in detail and she understands these to include infection; wound separation; hernia; vaginal cuff separation, injury to adjacent organs such as bowel, bladder, blood vessels, ureters and nerves; bleeding which may require blood transfusion; anesthesia risk; thromboembolic events; possible death; unforeseen complications; possible  need for re-exploration; medical complications such as heart attack, stroke, and pneumonia; and, if staging performed the risk of lymphedema and lymphocyst.  The patient will receive DVT and antibiotic prophylaxis as indicated.  She voiced a clear understanding.  She had the opportunity to ask questions and electronic informed consent was obtained today.  OR Posting Instructions Location: [facility] OR Chance of conversion to open: > 50% Ureteral stents/cystoscopy: this patient does not have a preoperative indication for pre-operative ureteral stent placement or  post-procedure cystoscopy. . Anesthesia type: General/local PASS: in person Postop disposition: overnight stay indicated due to anticipated extensive surgery  Postop appt: 2-3 weeks postoperative   Preop Checklist  Steroid/biologic medication recommendations: Patient not on chronic biologics/steroids. Bowel prep: indicated Labs will be obtained 1 week prior to surgery at lab and leave location.  No results found for: HGBA1C, HBA1C  Consults/clearance: Not indicated.    Not on therapeutic or prophylactic anticoagulation.  Gynecology VTE Prophylaxis Algorithm:   Does the patient have a personal history of VTE?:  No   Does the patient have a family history of VTE?:  No   Does the patient have cancer?:  Yes   Does the patient have disseminated cancer?:  Yes   Is this an open surgery/laparotomy?:  Yes   Is this a radical surgery?:  No   Risk Points::  3  BMI 25-29.9   Age 65-74 yrs old      Risk Category:  Very High Risk  RECOMMENDED PROPHYLAXIS:  One preoperative dose unfractionated heparin  followed by in hospital postoperative dosing with low molecular weight heparin  or unfractionated heparin  AND sequential compression devices AND 28 days low molecular weight heparin  (or return to preop anticoagulant dosing).    Explained that surgery may be cancelled or postponed or that surgeon may change due to scheduling/staffing constraints.    The diagnosis, an outline of the further diagnostic and laboratory studies which will be required, the recommendation, and alternatives were discussed.  All questions were answered to the patients satisfaction and they are comfortable with the plan.   Catarina Blinks, MS4  Attestation Statement:   I was present with the student and patient during the History and Physical Exam. I verify the findings documented, personally re-performed a Physical Exam, and agree with the Medical Decision Making (Assessment and Plan), with changes to the note by me  (ATTSTTP).  PRENTICE AGENT, MD   CC:  PRENTICE AGENT, MD  [address] / [place] [address]  [phone]

## 2023-12-26 ENCOUNTER — Other Ambulatory Visit: Payer: Self-pay

## 2023-12-26 DIAGNOSIS — Z1231 Encounter for screening mammogram for malignant neoplasm of breast: Secondary | ICD-10-CM

## 2023-12-27 ENCOUNTER — Other Ambulatory Visit: Payer: Self-pay

## 2023-12-27 ENCOUNTER — Telehealth: Payer: Self-pay | Admitting: *Deleted

## 2023-12-27 DIAGNOSIS — C563 Malignant neoplasm of bilateral ovaries: Secondary | ICD-10-CM

## 2023-12-27 NOTE — Telephone Encounter (Signed)
 Please schedule appts and notify pt of appt details:   Next week- Labs D1 (cbc, iron , ferritin, hold tube)  Poss blood or venofer  D2.   Nursing will contact pt on D1 to let her know if blood or iron  is needed.

## 2023-12-27 NOTE — Telephone Encounter (Signed)
 The patient called today and said based on the nurse practitioner at [facility] her blood counts RBC WBC possibly anemia.  NPI suggest that maybe she should come over and maybe get blood infusion/ an IV iron  if needed for she has her surgery. Surgery is done on 9 /9 with Dr. Mancil   CBC  [facility]08/28/2025  Component 12/26/2023       WBC (White Blood Cell Count) 2.9 Low    Load older lab results  RBC (Red Blood Cell Count) 3.20 Low    Load older lab results  Hemoglobin 9.9 Low    Load older lab results  Hematocrit 30.7 Low    Load older lab results  MCV (Mean Corpuscular Volume) 96 Load older lab results  MCH (Mean Corpuscular Hemoglobin) 30.9 Load older lab results  MCHC (Mean Corpuscular Hemoglobin Concentration) 32.2 Load older lab results  RDW-CV (Red Cell Distribution Width) 23.0 High    Load older lab results  MPV (Mean Platelet Volume) 9.5 Load older lab results  Immature Granulocyte Count -- Load older lab results  Platelets 177   NRBC (Nucleated Red Blood Cell Count) 0.00   NRBC % (Nucleated Red Blood Cell %) 0.0

## 2024-01-03 ENCOUNTER — Inpatient Hospital Stay: Attending: Oncology

## 2024-01-03 ENCOUNTER — Telehealth: Payer: Self-pay

## 2024-01-03 DIAGNOSIS — C569 Malignant neoplasm of unspecified ovary: Secondary | ICD-10-CM | POA: Diagnosis not present

## 2024-01-03 DIAGNOSIS — C563 Malignant neoplasm of bilateral ovaries: Secondary | ICD-10-CM

## 2024-01-03 LAB — CBC WITH DIFFERENTIAL (CANCER CENTER ONLY)
Abs Immature Granulocytes: 0.01 K/uL (ref 0.00–0.07)
Basophils Absolute: 0 K/uL (ref 0.0–0.1)
Basophils Relative: 1 %
Eosinophils Absolute: 0.1 K/uL (ref 0.0–0.5)
Eosinophils Relative: 2 %
HCT: 33.3 % — ABNORMAL LOW (ref 36.0–46.0)
Hemoglobin: 10.7 g/dL — ABNORMAL LOW (ref 12.0–15.0)
Immature Granulocytes: 0 %
Lymphocytes Relative: 37 %
Lymphs Abs: 1.4 K/uL (ref 0.7–4.0)
MCH: 31.4 pg (ref 26.0–34.0)
MCHC: 32.1 g/dL (ref 30.0–36.0)
MCV: 97.7 fL (ref 80.0–100.0)
Monocytes Absolute: 0.4 K/uL (ref 0.1–1.0)
Monocytes Relative: 11 %
Neutro Abs: 1.9 K/uL (ref 1.7–7.7)
Neutrophils Relative %: 49 %
Platelet Count: 196 K/uL (ref 150–400)
RBC: 3.41 MIL/uL — ABNORMAL LOW (ref 3.87–5.11)
RDW: 22.3 % — ABNORMAL HIGH (ref 11.5–15.5)
WBC Count: 3.9 K/uL — ABNORMAL LOW (ref 4.0–10.5)
nRBC: 0 % (ref 0.0–0.2)

## 2024-01-03 LAB — SAMPLE TO BLOOD BANK

## 2024-01-03 LAB — IRON AND TIBC
Iron: 79 ug/dL (ref 28–170)
Saturation Ratios: 23 % (ref 10.4–31.8)
TIBC: 349 ug/dL (ref 250–450)
UIBC: 270 ug/dL

## 2024-01-03 LAB — FERRITIN: Ferritin: 179 ng/mL (ref 11–307)

## 2024-01-03 NOTE — Telephone Encounter (Signed)
 Dr. Babara has reveiwed labs for this patient and patient does not need blood or IV iron . Appts have been cancelled. Pt has been notifed of this and verbalized understanding.

## 2024-01-06 ENCOUNTER — Inpatient Hospital Stay

## 2024-01-07 DIAGNOSIS — N1831 Chronic kidney disease, stage 3a: Secondary | ICD-10-CM | POA: Diagnosis not present

## 2024-01-07 DIAGNOSIS — C786 Secondary malignant neoplasm of retroperitoneum and peritoneum: Secondary | ICD-10-CM | POA: Diagnosis not present

## 2024-01-07 DIAGNOSIS — Z5331 Laparoscopic surgical procedure converted to open procedure: Secondary | ICD-10-CM | POA: Diagnosis not present

## 2024-01-07 DIAGNOSIS — E785 Hyperlipidemia, unspecified: Secondary | ICD-10-CM | POA: Diagnosis not present

## 2024-01-07 DIAGNOSIS — C481 Malignant neoplasm of specified parts of peritoneum: Secondary | ICD-10-CM | POA: Diagnosis not present

## 2024-01-07 DIAGNOSIS — Z9221 Personal history of antineoplastic chemotherapy: Secondary | ICD-10-CM | POA: Diagnosis not present

## 2024-01-07 DIAGNOSIS — D649 Anemia, unspecified: Secondary | ICD-10-CM | POA: Diagnosis not present

## 2024-01-07 DIAGNOSIS — N736 Female pelvic peritoneal adhesions (postinfective): Secondary | ICD-10-CM | POA: Diagnosis not present

## 2024-01-07 DIAGNOSIS — C563 Malignant neoplasm of bilateral ovaries: Secondary | ICD-10-CM | POA: Diagnosis not present

## 2024-01-07 DIAGNOSIS — C7982 Secondary malignant neoplasm of genital organs: Secondary | ICD-10-CM | POA: Diagnosis not present

## 2024-01-07 DIAGNOSIS — F32A Depression, unspecified: Secondary | ICD-10-CM | POA: Diagnosis not present

## 2024-01-07 DIAGNOSIS — C5701 Malignant neoplasm of right fallopian tube: Secondary | ICD-10-CM | POA: Diagnosis not present

## 2024-01-07 DIAGNOSIS — K660 Peritoneal adhesions (postprocedural) (postinfection): Secondary | ICD-10-CM | POA: Diagnosis not present

## 2024-01-09 NOTE — Discharge Summary (Addendum)
 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 female with a past medical history significant for CKDIII, depression, and clinical stage IVB high-grade serous carcinoma of presumed ovarian origin s/p 4 cycles of carbo/taxol  (last 12/12/23) who was admitted for the above noted surgery.   Problems Addressed During Hospitalization: Clinical stage IVB high-grade serous carcinoma of presumed ovarian origin CKDIII  Depression  Brief Hospital Course by Problem:  #Clinical stage IVB high-grade serous carcinoma of presumed ovarian origin #Post operative state - Patient is POD#2 from a suboptimal interval debulking with diagnostic laparoscopy, ex lap, TAH-BSO, omentectomy, and exparel rectus sheath blocks  - Please see operative note for details - Patient tolerated procedure without difficulty - Patient progressing well after surgery and meeting post-op milestones. 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 Hgb of 9.8 on POD#1, down to 8.7 on POD#2 - Iron  studies w/ TSAT of 7% - Patient received iron  transfusion on 9/10  #Chronic Kidney Disease Stage IIIa On admission, creatinine was 1.1. It was  1.3 on day of discharge. - NSAIDs were avoided during hospitalization - Monitored with daily BMPs   #Depression - Continued home citalopram  throughout the hospitalization  #Hypomagnesemia - Magnesium was 1.4 on 9/10 and replaced.   Consults: PT  Condition at Discharge: Condition: stable Pain: Controlled with oxycodone  and acetaminophen .  VTE prophylaxis: lovenox   Disposition: discharged to home, will follow up in clinic   Physical Exam at Discharge: Temp (24hrs), Avg:36.8 C (98.3 F), Min:36.7 C (98.1 F), Max:37.2 C (98.9 F)  Temp:  [36.7 C (98.1 F)-37.2 C (98.9 F)] 36.8 C (98.2 F) Heart Rate:  [74-86] 81 Resp:  [17-18] 18 BP: (108-136)/(49-62) 136/56 General appearance: alert, appears stated age, and cooperative Lungs: NWOB Heart: regular rate  Abdomen: soft, non-tende; no masses,  no organomegaly Extremities: no lower extremity edema Skin: no rashes or lesions Post-Wounds: clean, dry, intact. Incision closed with absorbable suture and skin glue   Allergies and Medications at Discharge: No Known Allergies    Medication List     START taking these medications    acetaminophen  325 MG tablet Commonly known as: TYLENOL  Take 3 tablets (975 mg total) by mouth every 6 (six) hours Replaces: acetaminophen  650 MG ER tablet   enoxaparin  40 mg/0.4 mL injection syringe Commonly known as: LOVENOX  Inject 0.4 mLs (40 mg total) subcutaneously once daily for 26 days in abdomen, rotating site with each injection.   oxyCODONE  5 MG immediate release tablet Commonly known as: ROXICODONE  Take 1-2 tablets (5-10 mg total) by mouth every 4 (four) hours as needed for Pain       CONTINUE taking these medications    citalopram  40 MG tablet Commonly known as: CeleXA    dexAMETHasone  4 MG tablet Commonly known as: DECADRON    lactulose  10 gram/15 mL  oral solution Commonly known as: ENULOSE    ondansetron  4 MG disintegrating tablet Commonly known as: ZOFRAN -ODT Take 1  tablet (4 mg total) by mouth every 8 (eight) hours as needed for Nausea for up to 8 doses   ondansetron  8 MG tablet Commonly known as: ZOFRAN    polyethylene glycol powder Commonly known as: MIRALAX   prochlorperazine  10 MG tablet Commonly known as: COMPAZINE    sennosides 8.6 mg tablet Commonly known as: SENOKOT       STOP taking these medications    acetaminophen  650 MG ER tablet Commonly known as: TYLENOL  Replaced by: acetaminophen  325 MG tablet   metroNIDAZOLE 500 MG tablet Commonly known as: FLAGYL   neomycin 500 mg tablet   peg-electrolyte solution Commonly known as: NuLYTELY         Where to Get Your Medications     These medications were sent to [id number], [place] - [address]  [address] [place], [place] [address]    Phone: [phone]  acetaminophen  325 MG tablet enoxaparin  40 mg/0.4 mL injection syringe oxyCODONE  5 MG immediate release tablet      Follow-up:    [facility] Appointments: Appointments which have been scheduled for you    Feb 11, 2024 8:30 AM Follow Up Appointment with Romero Kallie Antigua, PA [facility]) [address] [place] [address] [phone]  Arrive 15 minutes prior to appointment.        Gyn Oncology Follow up patient will follow up at [facility], appointment requested   Outside  Appointments: As above  Outside Providers, with questions related to your patient's hospitalization, please contact the Gyn Oncology PA/Fellow on call  through the [facility] paging operator [phone].   Signed,  Talbert Arch, MS4  SIERA JENNA GRIST, MD  01/09/2024   PRENTICE AGENT, MD

## 2024-01-10 ENCOUNTER — Other Ambulatory Visit: Payer: Self-pay | Admitting: Oncology

## 2024-01-10 ENCOUNTER — Other Ambulatory Visit: Payer: Self-pay

## 2024-01-10 MED ORDER — ENOXAPARIN SODIUM 40 MG/0.4ML IJ SOSY
40.0000 mg | PREFILLED_SYRINGE | Freq: Every day | INTRAMUSCULAR | 0 refills | Status: DC
  Filled 2024-01-10: qty 8, 20d supply, fill #0
  Filled 2024-01-24 – 2024-01-27 (×2): qty 2.4, 6d supply, fill #1

## 2024-01-10 MED FILL — Lactulose Solution 10 GM/15ML: ORAL | 2 days supply | Qty: 236 | Fill #0 | Status: AC

## 2024-01-11 ENCOUNTER — Other Ambulatory Visit: Payer: Self-pay

## 2024-01-24 ENCOUNTER — Other Ambulatory Visit: Payer: Self-pay

## 2024-01-27 ENCOUNTER — Other Ambulatory Visit: Payer: Self-pay

## 2024-01-29 ENCOUNTER — Inpatient Hospital Stay: Attending: Oncology | Admitting: Oncology

## 2024-01-29 ENCOUNTER — Inpatient Hospital Stay

## 2024-01-29 ENCOUNTER — Encounter: Payer: Self-pay | Admitting: Oncology

## 2024-01-29 ENCOUNTER — Inpatient Hospital Stay (HOSPITAL_BASED_OUTPATIENT_CLINIC_OR_DEPARTMENT_OTHER): Admitting: Obstetrics and Gynecology

## 2024-01-29 VITALS — BP 120/74 | HR 99 | Temp 97.8°F | Resp 19 | Wt 159.7 lb

## 2024-01-29 DIAGNOSIS — C786 Secondary malignant neoplasm of retroperitoneum and peritoneum: Secondary | ICD-10-CM | POA: Insufficient documentation

## 2024-01-29 DIAGNOSIS — T451X5A Adverse effect of antineoplastic and immunosuppressive drugs, initial encounter: Secondary | ICD-10-CM | POA: Diagnosis not present

## 2024-01-29 DIAGNOSIS — Z23 Encounter for immunization: Secondary | ICD-10-CM | POA: Insufficient documentation

## 2024-01-29 DIAGNOSIS — Z79630 Long term (current) use of alkylating agent: Secondary | ICD-10-CM | POA: Insufficient documentation

## 2024-01-29 DIAGNOSIS — Z5111 Encounter for antineoplastic chemotherapy: Secondary | ICD-10-CM | POA: Insufficient documentation

## 2024-01-29 DIAGNOSIS — C563 Malignant neoplasm of bilateral ovaries: Secondary | ICD-10-CM

## 2024-01-29 DIAGNOSIS — N1831 Chronic kidney disease, stage 3a: Secondary | ICD-10-CM | POA: Diagnosis not present

## 2024-01-29 DIAGNOSIS — Z79633 Long term (current) use of mitotic inhibitor: Secondary | ICD-10-CM | POA: Diagnosis not present

## 2024-01-29 DIAGNOSIS — G620 Drug-induced polyneuropathy: Secondary | ICD-10-CM | POA: Diagnosis not present

## 2024-01-29 DIAGNOSIS — C569 Malignant neoplasm of unspecified ovary: Secondary | ICD-10-CM | POA: Diagnosis not present

## 2024-01-29 MED ORDER — INFLUENZA VAC SPLIT HIGH-DOSE 0.5 ML IM SUSY
0.5000 mL | PREFILLED_SYRINGE | INTRAMUSCULAR | Status: AC
  Administered 2024-01-29: 0.5 mL via INTRAMUSCULAR
  Filled 2024-01-29: qty 0.5

## 2024-01-29 NOTE — Assessment & Plan Note (Signed)
Influenza vaccination today 

## 2024-01-29 NOTE — Progress Notes (Signed)
 Hematology/Oncology Progress note [phone] [phone]    CHIEF COMPLAINTS/REASON FOR VISIT:  High-grade serous carcinoma of ovaries. peritoneal carcinomatosis, liver lesion.   ASSESSMENT & PLAN:    Cancer Staging  Ovarian cancer [facility]) Staging form: Ovary, Fallopian Tube, and Primary Peritoneal Carcinoma, AJCC 8th Edition - Clinical stage from 09/25/2023: FIGO Stage IV (cT3c, cN1b, cM1) - Signed by Babara Call, MD on 10/05/2023   Ovarian cancer [facility]) Image findings and pathology results were reviewed and discussed with patient. 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  Normal CEA.  Elevated CA125 609, elevated CA 27.29 -415  S/p neoadjuvant chemotherapy with carboplatin /Taxol  x 4 Bevacizumab was not given in neoadjuvant setting due to tumor abutting bowel wall.  HRD positive  S/p  debulking surgery.  Currently on prophylactic Lovenox .   Discussed with GynOnc Dr. Elby. Recommend additional 3 cycles of chemotherapy. Plan cycle 5 Carboplatin  Taxol  next week Add Bevacizumab in cycle 6.       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   CKD (chronic kidney disease) stage 3, GFR 30-59 ml/min (HCC) Encouraged oral hydration, avoid nephrotoxins.   Need for prophylactic vaccination and inoculation against influenza Influenza vaccination today  Orders Placed This Encounter  Procedures   CA 125    Standing Status:   Future    Expected Date:   02/06/2024    Expiration Date:   02/05/2025   CBC with Differential (Cancer Center Only)    Standing Status:   Future    Expected Date:   02/06/2024    Expiration Date:   02/05/2025   CMP (Cancer Center only)    Standing Status:   Future    Expected Date:   02/06/2024    Expiration Date:   02/05/2025   Follow-up 1 week  All questions were answered. The patient knows to call the clinic with any problems,  questions or concerns.  Call Babara, MD, PhD [facility] Hematology Oncology 01/29/2024   HISTORY OF PRESENTING ILLNESS:   Beth Hart is a  73 y.o.  female with PMH listed below presents for follow up of stage IV ovarian high grade serous carcinoma.  Oncology History  Peritoneal carcinomatosis (HCC)  09/18/2023 Initial Diagnosis   Peritoneal carcinomatosis (HCC)   10/10/2023 -  Chemotherapy   Patient is on Treatment Plan : OVARIAN Carboplatin  (AUC 6) + Paclitaxel  (175) q21d X 6 Cycles      Genetic Testing   Negative genetic testing. No pathogenic variants identified on the [facility] CancerNext+RNA panel. The report date is 10/20/2023.  The [facility] CancerNext+RNAinsight Panel includes sequencing, rearrangement analysis, and RNA analysis for the following 40 genes: APC, ATM, BAP1, BARD1, BMPR1A, BRCA1, BRCA2, BRIP1, CDH1, CDKN2A, CHEK2, FH, FLCN, MET, MLH1, MSH2, MSH6, MUTYH, NF1, NTHL1, PALB2, PMS2, PTEN, RAD51C, RAD51D, RPS20, SMAD4, STK11, TP53, TSC1, TSC2, and VHL (sequencing and deletion/duplication); AXIN2, HOXB13, MBD4, MSH3, POLD1 and POLE (sequencing only); EPCAM and GREM1 (deletion/duplication only).   Ovarian cancer (HCC)  09/25/2023 Cancer Staging   Staging form: Ovary, Fallopian Tube, and Primary Peritoneal Carcinoma, AJCC 8th Edition - Clinical stage from 09/25/2023: FIGO Stage IV (cT3c, cN1b, cM1) - Signed by Babara Call, MD on 10/05/2023 Stage prefix: Initial diagnosis   10/04/2023 Initial Diagnosis   Ovarian cancer  She experiences pressure and sharp pain in her abdomen, initially attributing it to a pulled muscle from yard work. The pain persisted and was accompanied by a fever, prompting her to  visit the ER on 09/16/2023. The fever was around 100F at home and remained at that level upon arrival at the ER. She has a persistent cough, especially when lying down.   09/16/2023, CT abdomen pelvis with contrast showed extensive omental and peritoneal carcinomatosis with small to moderate  ascites. multiple hypoattenuating masses in the liver and spleen, compatible with metastases. There are also multiple mildly enlarged retroperitoneal lymph nodes, also highly concerning for metastases.  09/24/2023, cytology from peritoneal ascites showed negative for malignancy. 09/25/2023, endometrial biopsy showed no malignancy. 09/26/2023 Patient underwent peritoneal mass biopsy,  1. Peritoneum, biopsy, RUQ mass :       - METASTATIC HIGH-GRADE SEROUS CARCINOMA WITH NECROSIS, SEE NOTE   Diagnosis Note : Immunohistochemical stains show that the tumor cells are positive for PAX8, p16 and p53 (clonal overexpression pattern) while they are negative for p63 and CK5/6.  This immunoprofile is consistent with above interpretation.    Tumor Testing: [facility] shows HRD but no somatic BRCA mutation Genomic instability  score of 43.   10/10/2023 -  Chemotherapy   Patient is on Treatment Plan : OVARIAN Carboplatin  (AUC 6) + Paclitaxel  (175) q21d X 6 Cycles      Imaging   CT chest w contrast  Several scattered sub 5 mm bilateral lung nodules identified. Based on appearance and other findings these are worrisome for early lung metastases. Few enlarged pre cardiophrenic mediastinal nodes as well as left internal mammary chain lymph nodes also worrisome for spread of disease.Tiny left pleural effusion.   Please correlate with previous abdomen pelvis CT describing abdominal findings.      Genetic Testing   Negative genetic testing. No pathogenic variants identified on the [facility] Cancer[facility]+RNA panel. The report date is 10/20/2023.  The [facility] CancerNext+RNAinsight Panel includes sequencing, rearrangement analysis, and RNA analysis for the following 40 genes: APC, ATM, BAP1, BARD1, BMPR1A, BRCA1, BRCA2, BRIP1, CDH1, CDKN2A, CHEK2, FH, FLCN, MET, MLH1, MSH2, MSH6, MUTYH, NF1, NTHL1, PALB2, PMS2, PTEN, RAD51C, RAD51D, RPS20, SMAD4, STK11, TP53, TSC1, TSC2, and VHL (sequencing and deletion/duplication);  AXIN2, HOXB13, MBD4, MSH3, POLD1 and POLE (sequencing only); EPCAM and GREM1 (deletion/duplication only).   01/07/2024 Surgery   Debulking surgery  Pathology showed  A. Omentum, omentectomy:   High-grade serous carcinoma (7.5 cm).     B. Left fallopian tube and ovary, left salpingo-oophorectomy:   High-grade serous carcinoma with surface involvement of the ovary. Benign fallopian tube.     C. Right fallopian tube and ovary, right salpingo-oophorectomy:   High-grade serous carcinoma with surface involvement of the ovary and fallopian tube.     D. Uterus and cervix, hysterectomy:   Uterus (39 grams): Endometrium: Inactive. Myometrium: Benign.  Cervix: Benign. Serosa: High-grade serous carcinoma.  TUMOR    Tumor Site:    Bilateral ovaries     Tumor Size:    Greatest Dimension (Centimeters): 1.7 cm     Histologic Type:    High-grade serous carcinoma     Ovarian Surface Involvement:    Present, right and left     Fallopian Tube Surface Involvement:    Present, right     Other Tissue / Organ Involvement:    Right ovary     Other Tissue / Organ Involvement:    Left ovary     Other Tissue / Organ Involvement:    Right fallopian tube     Other Tissue / Organ Involvement:    Omentum     Other Tissue / Organ Involvement:  uterine serosa     Largest Extrapelvic Peritoneal Focus:    Macroscopic (greater than 2 cm): Omentum     Peritoneal / Ascitic Fluid Involvement:    Not submitted / unknown     Chemotherapy Response Score (CRS):    CRS1 (no definite or minimal response)   REGIONAL LYMPH NODES     Regional Lymph Node Status:    Not applicable (no regional lymph nodes submitted or found)   pTNM CLASSIFICATION (AJCC 8th Edition)     Reporting of pT, pN, and (when applicable) pM categories is based on information available to the pathologist at the time the report is issued. As per the AJCC (Chapter 1, 8th Ed.) it is the managing physician's responsibility to establish the final  pathologic stage based upon all pertinent information, including but potentially not limited to this pathology report.     Modified Classification:    y     pT Category:    pT3c     pN Category:    pN not assigned (no nodes submitted or found)       Her family history includes father who had lung cancer attributed to smoking, but no history of colon, ovarian, or breast cancer.  She underwent a colonoscopy a few years ago, which was reported to be negative.  And a mammogram six months ago, which was negative she does not recall any recent stomach pain, dysphagia, or acid reflux.  INTERVAL HISTORY OMELIA MARQUART is a 73 y.o. female who has above history reviewed by me today presents for follow up visit for Stage IV ovarian cancer.  She s/p debulking surgery.  Surgical wound is healing well. She reports feeling well. No abdominal pain.     MEDICAL HISTORY:  Past Medical History:  Diagnosis Date   Anemia    Arthritis    Asthma    as a child   Chicken pox    Colon polyps 2012   Depression    Hemorrhoids    Ovarian cancer (HCC)     SURGICAL HISTORY: Past Surgical History:  Procedure Laterality Date   BREAST CYST ASPIRATION Left 90s   BREAST SURGERY  2007   aspiration   CARPAL TUNNEL RELEASE  2010   REFRACTIVE SURGERY     TONSILLECTOMY      SOCIAL HISTORY: Social History   Socioeconomic History   Marital status: Married    Spouse name: Not on file   Number of children: Not on file   Years of education: Not on file   Highest education level: Associate degree: occupational, Scientist, product/process development, or vocational program  Occupational History   Not on file  Tobacco Use   Smoking status: Never   Smokeless tobacco: Never  Vaping Use   Vaping status: Never Used  Substance and Sexual Activity   Alcohol use: No   Drug use: No   Sexual activity: Yes  Other Topics Concern   Not on file  Social History Narrative   married   Social Drivers of Corporate investment banker  Strain: Low Risk  (01/07/2024)   Received from [facility]   Overall Financial Resource Strain (CARDIA)    Difficulty of Paying Living Expenses: Not hard at all  Food Insecurity: No Food Insecurity (01/07/2024)   Received from [facility]   Hunger Vital Sign    Within the past 12 months, you worried that your food would run out before you got the money to buy more.:  Never true    Within the past 12 months, the food you bought just didn't last and you didn't have money to get more.: Never true  Transportation Needs: No Transportation Needs (01/08/2024)   Received from [facility] - Transportation    In the past 12 months, has lack of transportation kept you from medical appointments or from getting medications?: No    Lack of Transportation (Non-Medical): No  Physical Activity: Inactive (11/03/2023)   Exercise Vital Sign    Days of Exercise per Week: 0 days    Minutes of Exercise per Session: Not on file  Stress: No Stress Concern Present (11/03/2023)   [employer] of Occupational Health - Occupational Stress Questionnaire    Feeling of Stress: Only a little  Social Connections: Socially Integrated (11/03/2023)   Social Connection and Isolation Panel    Frequency of Communication with Friends and Family: More than three times a week    Frequency of Social Gatherings with Friends and Family: Once a week    Attends Religious Services: More than 4 times per year    Active Member of [employer] or Organizations: Yes    Attends Engineer, structural: More than 4 times per year    Marital Status: Married  Catering manager Violence: Not At Risk (09/25/2023)   Humiliation, Afraid, Rape, and Kick questionnaire    Fear of Current or Ex-Partner: No    Emotionally Abused: No    Physically Abused: No    Sexually Abused: No    FAMILY HISTORY: Family History  Problem Relation Age of Onset   Alzheimer's disease Mother    Lung cancer  Father    Prostate cancer Maternal Uncle    Prostate cancer Maternal Uncle    Prostate cancer Maternal Uncle    Prostate cancer Maternal Uncle    Prostate cancer Maternal Uncle    Prostate cancer Other        in mat great uncles, unk#   Prostate cancer Cousin        in 4-5 mat  first cousins   Breast cancer Neg Hx     ALLERGIES:  has no known allergies.  MEDICATIONS:  Current Outpatient Medications  Medication Sig Dispense Refill   citalopram  (CELEXA ) 40 MG tablet Take 1 tablet (40 mg total) by mouth daily. 30 tablet 3   dexamethasone  (DECADRON ) 4 MG tablet Take 2 tablets (8mg ) by mouth daily starting the day after carboplatin  for 3 days. Take with food 30 tablet 1   enoxaparin  (LOVENOX ) 40 MG/0.4ML injection Inject 0.4 mLs (40 mg total) into the skin daily for 26 days. Inject into abdomen and rotate sites with each injection. 10.4 mL 0   lactulose  (CHRONULAC ) 10 GM/15ML solution take15 to 30 ml 3 times a day when needed for constipation 236 mL 0   nystatin  (MYCOSTATIN ) 100000 UNIT/ML suspension Take 5 mLs (500,000 Units total) by mouth 4 (four) times daily. Swish and swallow 60 mL 0   omeprazole  (PRILOSEC) 20 MG capsule Take 1 capsule (20 mg total) by mouth daily. 30 capsule 0   ondansetron  (ZOFRAN ) 8 MG tablet Take 1 tablet (8 mg total) by mouth every 8 (eight) hours as needed for nausea or vomiting. Start on the third day after carboplatin . 30 tablet 1   oxyCODONE  (OXY IR/ROXICODONE ) 5 MG immediate release tablet Take 1 tablet (5 mg total) by mouth every 8 (eight) hours as needed for severe pain (pain score 7-10). 30 tablet 0  polyethylene glycol powder (GLYCOLAX/MIRALAX) 17 GM/SCOOP powder Take by mouth once.     prochlorperazine  (COMPAZINE ) 10 MG tablet Take 1 tablet (10 mg total) by mouth every 6 (six) hours as needed for nausea or vomiting. (Patient not taking: Reported on 01/29/2024) 30 tablet 1   senna (SENOKOT) 8.6 MG TABS tablet Take 1 tablet (8.6 mg total) by mouth daily as  needed for mild constipation. 120 tablet 0   No current facility-administered medications for this visit.   Facility-Administered Medications Ordered in Other Visits  Medication Dose Route Frequency Provider Last Rate Last Admin   heparin  lock flush 100 unit/mL  500 Units Intravenous Once Anabelen Kaminsky, MD        Review of Systems  Constitutional:  Positive for fatigue. Negative for appetite change, chills, fever and unexpected weight change.  HENT:   Negative for hearing loss and voice change.   Eyes:  Negative for eye problems.  Respiratory:  Negative for chest tightness and cough.   Cardiovascular:  Negative for chest pain.  Gastrointestinal:  Negative for abdominal distention, abdominal pain, blood in stool, nausea and vomiting.  Endocrine: Negative for hot flashes.  Genitourinary:  Negative for difficulty urinating and frequency.   Musculoskeletal:  Negative for arthralgias.  Skin:  Negative for itching and rash.  Neurological:  Negative for extremity weakness.  Hematological:  Negative for adenopathy.  Psychiatric/Behavioral:  Positive for depression and sleep disturbance. Negative for confusion.    PHYSICAL EXAMINATION: ECOG PERFORMANCE STATUS: 1 - Symptomatic but completely ambulatory There were no vitals filed for this visit.  There were no vitals filed for this visit.   Physical Exam Constitutional:      General: She is not in acute distress. HENT:     Head: Normocephalic and atraumatic.  Eyes:     General: No scleral icterus. Cardiovascular:     Rate and Rhythm: Normal rate and regular rhythm.     Heart sounds: Normal heart sounds.  Pulmonary:     Effort: Pulmonary effort is normal. No respiratory distress.     Breath sounds: No wheezing.  Abdominal:     General: Bowel sounds are normal. There is no distension.     Palpations: Abdomen is soft.  Musculoskeletal:        General: No deformity. Normal range of motion.     Cervical back: Normal range of motion and  neck supple.  Skin:    General: Skin is warm and dry.     Findings: No erythema or rash.  Neurological:     Mental Status: She is alert and oriented to person, place, and time. Mental status is at baseline.  Psychiatric:        Mood and Affect: Mood normal.     LABORATORY DATA:  I have reviewed the data as listed    Latest Ref Rng & Units 01/03/2024    9:18 AM 12/12/2023    8:19 AM 11/21/2023    8:26 AM  CBC  WBC 4.0 - 10.5 K/uL 3.9  5.8  4.4   Hemoglobin 12.0 - 15.0 g/dL 89.2  89.2  89.6   Hematocrit 36.0 - 46.0 % 33.3  33.3  32.3   Platelets 150 - 400 K/uL 196  141  216       Latest Ref Rng & Units 12/12/2023    8:19 AM 12/02/2023    2:17 PM 11/21/2023    8:26 AM  CMP  Glucose 70 - 99 mg/dL 887   866  BUN 8 - 23 mg/dL 36   29   Creatinine 9.55 - 1.00 mg/dL 8.77  8.79  8.96   Sodium 135 - 145 mmol/L 136   136   Potassium 3.5 - 5.1 mmol/L 4.6   4.5   Chloride 98 - 111 mmol/L 105   104   CO2 22 - 32 mmol/L 23   24   Calcium 8.9 - 10.3 mg/dL 9.4   9.1   Total Protein 6.5 - 8.1 g/dL 6.7   6.6   Total Bilirubin 0.0 - 1.2 mg/dL 0.5   0.5   Alkaline Phos 38 - 126 U/L 70   84   AST 15 - 41 U/L 26   32   ALT 0 - 44 U/L 22   27       RADIOGRAPHIC STUDIES: I have personally reviewed the radiological images as listed and agreed with the findings in the report. CT CHEST ABDOMEN PELVIS W CONTRAST Result Date: 12/11/2023 EXAM: CT CHEST, ABDOMEN AND PELVIS WITH CONTRAST 12/02/2023 02:33:35 PM TECHNIQUE: CT of the chest, abdomen and pelvis was performed with the administration of intravenous contrast. Multiplanar reformatted images are provided for review. Automated exposure control, iterative reconstruction, and/or weight based adjustment of the mA/kV was utilized to reduce the radiation dose to as low as reasonably achievable. COMPARISON: CT chest from 10/03/2023 and CT abdomen and pelvis from 09/16/2023. CLINICAL HISTORY: Ovarian cancer. 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 FINDINGS: CHEST: MEDIASTINUM: Heart and pericardium are unremarkable. The central airways are clear. THORACIC LYMPH NODES: No mediastinal, hilar or axillary lymphadenopathy. LUNGS AND PLEURA: Right lower lobe lung nodule measures 3 mm, image 64/4, previously 5 mm. The previous 4 mm right lower lobe lung nodule has resolved in the interval. Previous 4 mm nodule within the lower right lower lobe measures 2 mm, image 92/4, previously 4 mm. Calcified granuloma identified within the left upper lobe. No pleural effusion or pneumothorax. ABDOMEN AND PELVIS: LIVER: Unchanged cyst along the dome of left lobe of liver measuring 2.3 cm, image 46/2. Inferior right lobe of liver metastasis measures 3.5 x 2.2 cm, image 59/2, previously 4.5 x 3.5 cm. Index lesion within segment 5 measures 1.3 x 0.9 cm, image 59/2, previously 1.6 x 1.1 cm. No new liver lesions. GALLBLADDER AND BILE DUCTS: Gallstones are again noted. No biliary ductal dilatation. Common bile duct is upper limits of normal measuring 6 mm, similar to previous exam. SPLEEN: Multiple splenic metastases. Index lesion within the posterior lateral spleen measures 2.4 x 1.6 cm, image 55/2, previously 3.2 x 2.0 cm. Inferior splenic lesion measures 3.1 x 2.5 cm, previously this measured the same. PANCREAS: No pancreatic inflammation, main duct dilatation or mass. ADRENAL GLANDS: Normal adrenal glands. KIDNEYS, URETERS AND BLADDER: Bilateral renal cortical scarring and volume loss noted. No nephrolithiasis, hydronephrosis or suspicious mass. No bladder abnormality.  GI AND BOWEL: No signs of bowel obstruction. Moderate stool burden noted throughout the colon. PERITONEUM AND RETROPERITONEUM: Peritoneal metastasis again noted. Index lesion within the left hemiabdomen along the undersurface of the ventral abdominal wall measures 2.6 x 1.6 cm, image 75/2, on the previous exam this measured  4.3 x 2.6 cm. At a slightly higher level peritoneal nodule measures 2.2 x 1.8 cm, image 70/2, previously 3.1 x 2.6 cm. Soft tissue nodularity along the undersurface of both hemidiaphragms is also improved in the interval. Left upper quadrant peritoneal nodule measures 2.5 x 1.8 cm, image 63/2, on the previous exam is measured 3.3 x 2.4 cm. Interval resolution of previously noted ascites. VASCULATURE: Aorta is normal in caliber. ABDOMINAL AND PELVIS LYMPH NODES: Abdominal pelvic adenopathy is again noted. Index aortocaval lymph node measures 1 cm short axis, image 77/2, previously 1.1 cm. Index lymph node left periaortic lymph node measures 1.4 cm, image 60/2, previously 1.5 cm. Left common iliac lymph node measures 0.9 cm, image 84/2, previously 1.2 cm. Right external iliac node measures 0.9 cm, image 101/2, previously 1 cm. REPRODUCTIVE ORGANS: No focal abnormality involving the uterus. BONES AND SOFT TISSUES: No acute or suspicious osseous findings. IMPRESSION: 1. Interval improvement in peritoneal metastases, splenic metastases, and liver metastases. 2. Interval resolution of previously noted ascites. 3. Abdominal pelvic adenopathy, improved in the interval. 4. Interval improvement in right lower lobe lung nodules. Electronically signed by: Waddell Calk MD 12/11/2023 09:22 AM EDT RP Workstation: [id number]

## 2024-01-29 NOTE — Assessment & Plan Note (Signed)
 Encouraged oral hydration, avoid nephrotoxins.

## 2024-01-29 NOTE — Progress Notes (Signed)
 Patient was seen in exam room 20 of the [facility] GYN ONC Department., where I assisted with pelvic exam. I, Rayfield Jasmine RN, was present as chaperone for the sensitive portion of the exam.

## 2024-01-29 NOTE — Assessment & Plan Note (Addendum)
 Image findings and pathology results were reviewed and discussed with patient. 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  Normal CEA.  Elevated CA125 609, elevated CA 27.29 -415  S/p neoadjuvant chemotherapy with carboplatin /Taxol  x 4 Bevacizumab was not given in neoadjuvant setting due to tumor abutting bowel wall.  HRD positive  S/p  debulking surgery.  Currently on prophylactic Lovenox .   Discussed with GynOnc Dr. Elby. Recommend additional 3 cycles of chemotherapy. Plan cycle 5 Carboplatin  Taxol  next week Add Bevacizumab in cycle 6.

## 2024-01-29 NOTE — Assessment & Plan Note (Signed)
 Grade 1/2  Monitor.

## 2024-01-29 NOTE — Progress Notes (Signed)
 Gynecologic Oncology Interval Visit   Referring Provider: Dr. [name]  Chief Concern: High grade serous ovarian cancer, stage IV  Subjective:  Beth Hart is a 73 y.o. G1P1 female who is seen in consultation from Dr. Cap who saw her at the request of Dr. Abbey for peritoneal carcinomatosis and a liver lesion.   She presents for her postoperative visit.  On 01/07/2024 she exam under anesthesia, Diagnostic laparoscopy  Exploratory laparotomy, Total hysterectomy, Bilateral salpingo-oophorectomy, Omentectomy, Sub-optimal debulking, Exparel Rectus Sheath Block. The patient asked about bowel surgery and I reviewed te operative report and there was no mention of bowel surgery.   Pathology:  A. Omentum, omentectomy: High-grade serous carcinoma (7.5 cm). B. Left fallopian tube and ovary, left salpingo-oophorectomy: High-grade serous carcinoma with surface involvement of the ovary. Benign fallopian tube. C. Right fallopian tube and ovary, right salpingo-oophorectomy: High-grade serous carcinoma with surface involvement of the ovary and fallopian tube. D. Uterus and cervix, hysterectomy: Uterus (39 grams): Endometrium: Inactive. Myometrium: Benign.  Cervix: Benign. Serosa: High-grade serous carcinoma         Component Ref Range & Units (hover) 1 mo ago 2 mo ago 3 mo ago 4 mo ago  Cancer Antigen (CA) 125 187.0 High  218.0 High  CM 396.0 High  CM 609.0 High  CM  Comment: (NOTE)       Gyn Oncology History 09/16/2023 She experienced persistent pressure and sharp pain in her abdomen and fever, prompting her to visit the ER.   CT abdomen pelvis with contrast showed extensive omental and peritoneal carcinomatosis with small to moderate ascites. There are multiple hypoattenuating masses in the liver and spleen, compatible with metastases. There are also multiple mildly enlarged retroperitoneal lymph nodes, also highly concerning for metastases. 2. There is mixed solid/cystic appearance of  bilateral ovaries, which may be due to drop metastasis along the surface. However, correlate clinically and with tumor markers to determine the need for additional imaging with contrast-enhanced MRI pelvis. 3. There are at least 3, sub 4 mm, solid noncalcified nodules in the visualized bilateral lungs with largest measuring up to 3.4 x 4.0 mm in the right lung lower lobe, partially seen. In the given clinical context, these are also highly concerning for metastases. Consider further evaluation with dedicated chest CT and establish a baseline.   09/18/2023 Tumor markers CA 27.29 415.6 High    CEA 1.2   Cancer Antigen (CA) 125 609.0 High    09/24/2023 Paracentesis - cytology positive 09/24/23 Normal PAP and endometrial biopsy 09/26/23 Peritoneum, biopsy, RUQ mass :  METASTATIC HIGH-GRADE SEROUS CARCINOMA WITH NECROSIS, SEE NOTE  Diagnosis Note : Immunohistochemical stains show that the tumor cells are positive for PAX8, p16 and p53 (clonal overexpression pattern) while they are negative for p63 and CK5/6.  This immunoprofile is consistent with above interpretation.     Neoadjuvant chemotherapy treated by Dr Cap.   She did not tolerate Carboplatin  AUC 6 with Paclitaxel  175mg /m2 x1 cycle.  On dose reduced regimen carboplatin  AUC 5, Paclitaxel  135mg /m2 x2 cycles.    12/02/23 CT scan CHEST:  LUNGS AND PLEURA: Right lower lobe lung nodule measures 3 mm, image 64/4, previously 5 mm. The previous 4 mm right lower lobe lung nodule has resolved in the interval. Previous 4 mm nodule within the lower right lower lobe measures 2 mm, image 92/4, previously 4 mm. Calcified granuloma identified within the left upper lobe. No pleural effusion or pneumothorax.    LIVER: Unchanged cyst along the dome of left  lobe of liver measuring 2.3 cm, image 46/2. Inferior right lobe of liver metastasis measures 3.5 x 2.2 cm, image 59/2, previously 4.5 x 3.5 cm. Index lesion within segment 5 measures 1.3 x 0.9 cm, image  59/2, previously 1.6 x 1.1 cm.   SPLEEN: Multiple splenic metastases. Index lesion within the posterior lateral spleen measures 2.4 x 1.6 cm, image 55/2, previously 3.2 x 2.0 cm. Inferior splenic lesion measures 3.1 x 2.5 cm, previously this measured the same.    PERITONEUM AND RETROPERITONEUM: Peritoneal metastasis again noted. Index lesion within the left hemiabdomen along the undersurface of the ventral abdominal wall measures 2.6 x 1.6 cm, image 75/2, on the previous exam this measured 4.3 x 2.6 cm. At a slightlyhigher level peritoneal nodule measures 2.2 x 1.8 cm, image 70/2, previously3.1 x 2.6 cm. Soft tissue nodularity along the undersurface of both hemidiaphragms is also improved in the interval. Left upper quadrant peritoneal nodule measures 2.5 x 1.8 cm, image 63/2, on the previous exam is measured 3.3 x 2.4 cm. Interval resolution of previously noted ascites.   ABDOMINAL AND PELVIS LYMPH NODES: Abdominal pelvic adenopathy is again noted. Index aortocaval lymph node measures 1 cm short axis, image 77/2, previously 1.1 cm. Index lymph node left periaortic lymph node measures 1.4 cm, image 60/2, previously 1.5 cm. Left common iliac lymph node measures 0.9 cm, image 84/2, previously 1.2 cm. Right external iliac node measures 0.9 cm, image 101/2, previously 1 cm.   IMPRESSION: 1. Interval improvement in peritoneal metastases, splenic metastases, and liver metastases. 2. Interval resolution of previously noted ascites. 3. Abdominal pelvic adenopathy, improved in the interval. 4. Interval improvement in right lower lobe lung nodules.    Genetic Testing: Ambry panel testing negative for germline mutation. The [facility] CancerNext+RNAinsight Panel includes sequencing, rearrangement analysis, and RNA analysis for the following 40 genes: APC, ATM, BAP1, BARD1, BMPR1A, BRCA1, BRCA2, BRIP1, CDH1, CDKN2A, CHEK2, FH, FLCN, MET, MLH1, MSH2, MSH6, MUTYH, NF1, NTHL1, PALB2, PMS2, PTEN, RAD51C, RAD51D, RPS20,  SMAD4, STK11, TP53, TSC1, TSC2, and VHL (sequencing and deletion/duplication); AXIN2, HOXB13, MBD4, MSH3, POLD1 and POLE (sequencing only); EPCAM and GREM1 (deletion/duplication only).   Tumor Testing: [facility] MyChoice shows HRD but no somatic BRCA mutation GI score of 43.    Her family history includes father who had lung cancer attributed to smoking, but no history of colon, ovarian, or breast cancer.    Problem List: Patient Active Problem List   Diagnosis Date Noted   Genetic testing 10/21/2023   Weight loss 10/17/2023   Chemotherapy-induced neuropathy 10/17/2023   IDA (iron  deficiency anemia) 10/10/2023   Encounter for antineoplastic chemotherapy 10/10/2023   Symptomatic anemia 10/10/2023   Ovarian cancer (HCC) 10/04/2023   Liver lesion 09/18/2023   Peritoneal carcinomatosis (HCC) 09/18/2023   Iron  deficiency anemia 09/18/2023   Positive self-administered antigen test for COVID-19 01/16/2023   Routine general medical examination at a health care facility 08/17/2022   Hyperparathyroidism due to renal insufficiency 08/16/2022   Prediabetes 08/15/2021   Obesity (BMI 30.0-34.9) 07/25/2020   Proteinuria 06/25/2019   Depression 11/23/2015   CKD (chronic kidney disease) stage 3, GFR 30-59 ml/min (HCC) 11/23/2015   Hyperlipidemia 11/23/2015    Past Medical History: Past Medical History:  Diagnosis Date   Anemia    Arthritis    Asthma    as a child   Chicken pox    Colon polyps 2012   Depression    Hemorrhoids    Ovarian cancer (HCC)     Past  Surgical History: Past Surgical History:  Procedure Laterality Date   BREAST CYST ASPIRATION Left 90s   BREAST SURGERY  2007   aspiration   CARPAL TUNNEL RELEASE  2010   REFRACTIVE SURGERY     TONSILLECTOMY      Past Gynecologic History:  Menarche: in 1964 Menstrual details: Postmenopausal Last Menstrual Period: unknown History of Abnormal pap: No history of abnormal Paps   OB History:  OB History  Gravida Para Term  Preterm AB Living  1 1 1   1   SAB IAB Ectopic Multiple Live Births      1    # Outcome Date GA Lbr Len/2nd Weight Sex Type Anes PTL Lv  1 Term             Family History: Family History  Problem Relation Age of Onset   Alzheimer's disease Mother    Lung cancer Father    Prostate cancer Maternal Uncle    Prostate cancer Maternal Uncle    Prostate cancer Maternal Uncle    Prostate cancer Maternal Uncle    Prostate cancer Maternal Uncle    Prostate cancer Other        in mat great uncles, unk#   Prostate cancer Cousin        in 4-5 mat  first cousins   Breast cancer Neg Hx     Social History: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  She has 1 biological child and 2 children from the marriage.  She has 4 grandchildren. Social History   Socioeconomic History   Marital status: Married    Spouse name: Not on file   Number of children: Not on file   Years of education: Not on file   Highest education level: Associate degree: occupational, Scientist, product/process development, or vocational program  Occupational History   Not on file  Tobacco Use   Smoking status: Never   Smokeless tobacco: Never  Vaping Use   Vaping status: Never Used  Substance and Sexual Activity   Alcohol use: No   Drug use: No   Sexual activity: Yes  Other Topics Concern   Not on file  Social History Narrative   married   Social Drivers of Corporate investment banker Strain: Low Risk  (01/07/2024)   [facility]   Overall Financial Resource Strain (CARDIA)    Difficulty of Paying Living Expenses: Not hard at all  Food Insecurity: No Food Insecurity (01/07/2024)   Received from [facility]   Hunger Vital Sign    Within the past 12 months, you worried that your food would run out before you got the money to buy more.: Never true    Within the past 12 months, the food you bought just didn't last and you didn't have money to get more.:  Never true  Transportation Needs: No Transportation Needs (01/08/2024)   Received from [facility] - Transportation    In the past 12 months, has lack of transportation kept you from medical appointments or from getting medications?: No    Lack of Transportation (Non-Medical): No  Physical Activity: Inactive (11/03/2023)   Exercise Vital Sign    Days of Exercise per Week: 0 days    Minutes of Exercise per Session: Not on file  Stress: No Stress Concern Present (11/03/2023)   [employer] of Occupational Health - Occupational Stress Questionnaire    Feeling  of Stress: Only a little  Social Connections: Socially Integrated (11/03/2023)   Social Connection and Isolation Panel    Frequency of Communication with Friends and Family: More than three times a week    Frequency of Social Gatherings with Friends and Family: Once a week    Attends Religious Services: More than 4 times per year    Active Member of [employer] or Organizations: Yes    Attends Engineer, structural: More than 4 times per year    Marital Status: Married  Catering manager Violence: Not At Risk (09/25/2023)   Humiliation, Afraid, Rape, and Kick questionnaire    Fear of Current or Ex-Partner: No    Emotionally Abused: No    Physically Abused: No    Sexually Abused: No    Allergies: No Known Allergies  Current Medications: Current Outpatient Medications  Medication Sig Dispense Refill   citalopram  (CELEXA ) 40 MG tablet Take 1 tablet (40 mg total) by mouth daily. 30 tablet 3   dexamethasone  (DECADRON ) 4 MG tablet Take 2 tablets (8mg ) by mouth daily starting the day after carboplatin  for 3 days. Take with food 30 tablet 1   enoxaparin  (LOVENOX ) 40 MG/0.4ML injection Inject 0.4 mLs (40 mg total) into the skin daily for 26 days. Inject into abdomen and rotate sites with each injection. 10.4 mL 0   lactulose  (CHRONULAC ) 10 GM/15ML solution take15 to 30 ml 3 times a day when needed for  constipation 236 mL 0   nystatin  (MYCOSTATIN ) 100000 UNIT/ML suspension Take 5 mLs (500,000 Units total) by mouth 4 (four) times daily. Swish and swallow 60 mL 0   omeprazole  (PRILOSEC) 20 MG capsule Take 1 capsule (20 mg total) by mouth daily. 30 capsule 0   ondansetron  (ZOFRAN ) 8 MG tablet Take 1 tablet (8 mg total) by mouth every 8 (eight) hours as needed for nausea or vomiting. Start on the third day after carboplatin . 30 tablet 1   oxyCODONE  (OXY IR/ROXICODONE ) 5 MG immediate release tablet Take 1 tablet (5 mg total) by mouth every 8 (eight) hours as needed for severe pain (pain score 7-10). 30 tablet 0   polyethylene glycol powder (GLYCOLAX/MIRALAX) 17 GM/SCOOP powder Take by mouth once.     senna (SENOKOT) 8.6 MG TABS tablet Take 1 tablet (8.6 mg total) by mouth daily as needed for mild constipation. 120 tablet 0   prochlorperazine  (COMPAZINE ) 10 MG tablet Take 1 tablet (10 mg total) by mouth every 6 (six) hours as needed for nausea or vomiting. (Patient not taking: Reported on 01/29/2024) 30 tablet 1   No current facility-administered medications for this visit.   Facility-Administered Medications Ordered in Other Visits  Medication Dose Route Frequency Provider Last Rate Last Admin   heparin  lock flush 100 unit/mL  500 Units Intravenous Once Babara Call, MD        Review of Systems General: no complaints  HEENT: no complaints  Lungs: no complaints  Cardiac: no complaints  GI: no complaints  GU: no complaints  Musculoskeletal: no complaints  Extremities: no complaints  Skin: no complaints  Neuro: no complaints  Endocrine: no complaints  Psych: no complaints      Objective:  Physical Examination:  BP 120/74   Pulse 99   Temp 97.8 F (36.6 C)   Resp 19   Wt 159 lb 11.2 oz (72.4 kg)   SpO2 99%   BMI 29.21 kg/m   Performance status: 1  GENERAL: Patient is a well appearing female in no acute distress  HEENT:  Atraumatic and normocephalic. PERRL, neck supple. ABDOMEN:   Soft, nontender. Nondistended. No masses/ascites/hernia/or hepatomegaly. Well healed incision, closed dry  and intact.  EXTREMITIES:  No peripheral edema.   SKIN:  Clear with no obvious rashes or skin changes. No nail dyscrasia. NEURO:  Nonfocal. Well oriented.  Appropriate affect.  Pelvic: EGBUS: no lesions Cervix: surgically absent Vagina: area of granulation tissue on the left treated with AgNO3.  Uterus: surgically absent Rectovaginal: deferred  Lab Review Lab Results  Component Value Date   WBC 3.9 (L) 01/03/2024   HGB 10.7 (L) 01/03/2024   HCT 33.3 (L) 01/03/2024   MCV 97.7 01/03/2024   PLT 196 01/03/2024     Chemistry      Component Value Date/Time   NA 136 12/12/2023 0819   NA 140 12/18/2018 0000   K 4.6 12/12/2023 0819   CL 105 12/12/2023 0819   CO2 23 12/12/2023 0819   BUN 36 (H) 12/12/2023 0819   BUN 20 12/18/2018 0000   CREATININE 1.22 (H) 12/12/2023 0819   GLU 97 12/18/2018 0000      Component Value Date/Time   CALCIUM 9.4 12/12/2023 0819   ALKPHOS 70 12/12/2023 0819   AST 26 12/12/2023 0819   ALT 22 12/12/2023 0819   BILITOT 0.5 12/12/2023 0819        Radiologic Imaging: 12/02/2023 EXAM: CT CHEST, ABDOMEN AND PELVIS WITH CONTRAST 12/02/2023 02:33:35 PM   TECHNIQUE: CT of the chest, abdomen and pelvis was performed with the administration of intravenous contrast. Multiplanar reformatted images are provided for review. Automated exposure control, iterative reconstruction, and/or weight based adjustment of the mA/kV was utilized to reduce the radiation dose to as low as reasonably achievable.   COMPARISON: CT chest from 10/03/2023 and CT abdomen and pelvis from 09/16/2023.   CLINICAL HISTORY: Ovarian cancer. Ovarian cancer (HCC); Staging form: Ovary, Fallopian Tube, and Primary Peritoneal Carcinoma, AJCC 8th Edition; - Clinical stage from 09/25/2023: FIGO Stage IV (cT3c, cN1b, cM1) - Signed by Babara Call, MD on 10/05/2023; Ovarian cancer [facility]); Image  findings and pathology results were reviewed and discussed with patient.; Working diagnosis is high-grade serous carcinoma of the reproductive organ origin-stage IV ovarian cancer, primary peritoneal carcinomatosis.; Normal CEA.   FINDINGS:   CHEST:   MEDIASTINUM: Heart and pericardium are unremarkable. The central airways are clear.   THORACIC LYMPH NODES: No mediastinal, hilar or axillary lymphadenopathy.   LUNGS AND PLEURA: Right lower lobe lung nodule measures 3 mm, image 64/4, previously 5 mm. The previous 4 mm right lower lobe lung nodule has resolved in the interval. Previous 4 mm nodule within the lower right lower lobe measures 2 mm, image 92/4, previously 4 mm. Calcified granuloma identified within the left upper lobe. No pleural effusion or pneumothorax.   ABDOMEN AND PELVIS:   LIVER: Unchanged cyst along the dome of left lobe of liver measuring 2.3 cm, image 46/2. Inferior right lobe of liver metastasis measures 3.5 x 2.2 cm, image 59/2, previously 4.5 x 3.5 cm. Index lesion within segment 5 measures 1.3 x 0.9 cm, image 59/2, previously 1.6 x 1.1 cm. No new liver lesions.   GALLBLADDER AND BILE DUCTS: Gallstones are again noted. No biliary ductal dilatation. Common bile duct is upper limits of normal measuring 6 mm, similar to previous exam.   SPLEEN: Multiple splenic metastases. Index lesion within the posterior lateral spleen measures 2.4 x 1.6 cm, image 55/2, previously 3.2 x 2.0 cm. Inferior splenic lesion measures 3.1 x 2.5 cm, previously  this measured the same.   PANCREAS: No pancreatic inflammation, main duct dilatation or mass.   ADRENAL GLANDS: Normal adrenal glands.   KIDNEYS, URETERS AND BLADDER: Bilateral renal cortical scarring and volume loss noted. No nephrolithiasis, hydronephrosis or suspicious mass. No bladder abnormality.   GI AND BOWEL: No signs of bowel obstruction. Moderate stool burden noted throughout the colon.   PERITONEUM AND  RETROPERITONEUM: Peritoneal metastasis again noted. Index lesion within the left hemiabdomen along the undersurface of the ventral abdominal wall measures 2.6 x 1.6 cm, image 75/2, on the previous exam this measured 4.3 x 2.6 cm. At a slightly higher level peritoneal nodule measures 2.2 x 1.8 cm, image 70/2, previously 3.1 x 2.6 cm. Soft tissue nodularity along the undersurface of both hemidiaphragms is also improved in the interval. Left upper quadrant peritoneal nodule measures 2.5 x 1.8 cm, image 63/2, on the previous exam is measured 3.3 x 2.4 cm. Interval resolution of previously noted ascites.   VASCULATURE: Aorta is normal in caliber.   ABDOMINAL AND PELVIS LYMPH NODES: Abdominal pelvic adenopathy is again noted. Index aortocaval lymph node measures 1 cm short axis, image 77/2, previously 1.1 cm. Index lymph node left periaortic lymph node measures 1.4 cm, image 60/2, previously 1.5 cm. Left common iliac lymph node measures 0.9 cm, image 84/2, previously 1.2 cm. Right external iliac node measures 0.9 cm, image 101/2, previously 1 cm.   REPRODUCTIVE ORGANS: No focal abnormality involving the uterus.   BONES AND SOFT TISSUES: No acute or suspicious osseous findings.   IMPRESSION: 1. Interval improvement in peritoneal metastases, splenic metastases, and liver metastases. 2. Interval resolution of previously noted ascites. 3. Abdominal pelvic adenopathy, improved in the interval. 4. Interval improvement in right lower lobe lung nodules.      Assessment:  TIPHANIE VO is a 73 y.o. female diagnosed with stage IV high grade serous ovarian cancer with peritoneal carcinomatosis 5/25 s/p NACT with interval suboptimal debulking on 01/07/2024 with TAHBSO omentectomy.    Intrahepatic and splenic metastases.   Ambry panel testing negative for germline mutations. My[facility] MyChoice shows HRD but no somatic BRCA mutation  Medical co-morbidities complicating care: Anemia, azotemia,  asthma Plan:   Problem List Items Addressed This Visit       Endocrine   Ovarian cancer (HCC) - Primary (Chronic)     Other   Peritoneal carcinomatosis (HCC) (Chronic)    Adequate healing after surgery. Recommend re-initiation of chemotherapy. Given suboptimally debulked disease consider discussion of adding bevacizumb for cycle #6 after ensuring wound healing complete. Discussed with Dr. Babara.   HRD disease she may benefit from PARPi and olaparib can be added to bev based on PAOLA-1. However, her GI score in only 43 and just meets GIS positivity. A discussion regarding risks and benefits will be needed.   Postoperative precautions provided.   Plan for re-evaluation after 3 additional cycles of therapy with Dr. Mancil.        Chabeli Barsamian Isidor Constable, MD   CC:  Dr. Zelphia Babara

## 2024-01-30 ENCOUNTER — Encounter: Payer: Self-pay | Admitting: Oncology

## 2024-01-31 ENCOUNTER — Inpatient Hospital Stay: Admitting: Hospice and Palliative Medicine

## 2024-02-06 ENCOUNTER — Inpatient Hospital Stay

## 2024-02-06 ENCOUNTER — Inpatient Hospital Stay (HOSPITAL_BASED_OUTPATIENT_CLINIC_OR_DEPARTMENT_OTHER): Admitting: Hospice and Palliative Medicine

## 2024-02-06 ENCOUNTER — Encounter: Payer: Self-pay | Admitting: Oncology

## 2024-02-06 ENCOUNTER — Inpatient Hospital Stay: Admitting: Oncology

## 2024-02-06 VITALS — BP 136/65 | HR 75 | Temp 99.3°F | Resp 18

## 2024-02-06 VITALS — BP 103/63 | HR 90 | Temp 98.8°F | Resp 18 | Wt 160.5 lb

## 2024-02-06 DIAGNOSIS — C786 Secondary malignant neoplasm of retroperitoneum and peritoneum: Secondary | ICD-10-CM

## 2024-02-06 DIAGNOSIS — N1831 Chronic kidney disease, stage 3a: Secondary | ICD-10-CM | POA: Diagnosis not present

## 2024-02-06 DIAGNOSIS — G620 Drug-induced polyneuropathy: Secondary | ICD-10-CM | POA: Diagnosis not present

## 2024-02-06 DIAGNOSIS — C563 Malignant neoplasm of bilateral ovaries: Secondary | ICD-10-CM

## 2024-02-06 DIAGNOSIS — Z5111 Encounter for antineoplastic chemotherapy: Secondary | ICD-10-CM | POA: Diagnosis not present

## 2024-02-06 DIAGNOSIS — C569 Malignant neoplasm of unspecified ovary: Secondary | ICD-10-CM

## 2024-02-06 DIAGNOSIS — T451X5A Adverse effect of antineoplastic and immunosuppressive drugs, initial encounter: Secondary | ICD-10-CM

## 2024-02-06 DIAGNOSIS — Z515 Encounter for palliative care: Secondary | ICD-10-CM

## 2024-02-06 LAB — CBC WITH DIFFERENTIAL (CANCER CENTER ONLY)
Abs Immature Granulocytes: 0.03 K/uL (ref 0.00–0.07)
Basophils Absolute: 0.1 K/uL (ref 0.0–0.1)
Basophils Relative: 1 %
Eosinophils Absolute: 0.3 K/uL (ref 0.0–0.5)
Eosinophils Relative: 5 %
HCT: 34.4 % — ABNORMAL LOW (ref 36.0–46.0)
Hemoglobin: 11 g/dL — ABNORMAL LOW (ref 12.0–15.0)
Immature Granulocytes: 1 %
Lymphocytes Relative: 32 %
Lymphs Abs: 2 K/uL (ref 0.7–4.0)
MCH: 32.5 pg (ref 26.0–34.0)
MCHC: 32 g/dL (ref 30.0–36.0)
MCV: 101.8 fL — ABNORMAL HIGH (ref 80.0–100.0)
Monocytes Absolute: 0.6 K/uL (ref 0.1–1.0)
Monocytes Relative: 10 %
Neutro Abs: 3.3 K/uL (ref 1.7–7.7)
Neutrophils Relative %: 51 %
Platelet Count: 233 K/uL (ref 150–400)
RBC: 3.38 MIL/uL — ABNORMAL LOW (ref 3.87–5.11)
RDW: 14.4 % (ref 11.5–15.5)
WBC Count: 6.4 K/uL (ref 4.0–10.5)
nRBC: 0 % (ref 0.0–0.2)

## 2024-02-06 LAB — CMP (CANCER CENTER ONLY)
ALT: 22 U/L (ref 0–44)
AST: 26 U/L (ref 15–41)
Albumin: 3.8 g/dL (ref 3.5–5.0)
Alkaline Phosphatase: 103 U/L (ref 38–126)
Anion gap: 8 (ref 5–15)
BUN: 28 mg/dL — ABNORMAL HIGH (ref 8–23)
CO2: 23 mmol/L (ref 22–32)
Calcium: 9.3 mg/dL (ref 8.9–10.3)
Chloride: 105 mmol/L (ref 98–111)
Creatinine: 1.21 mg/dL — ABNORMAL HIGH (ref 0.44–1.00)
GFR, Estimated: 47 mL/min — ABNORMAL LOW (ref >60)
Glucose, Bld: 108 mg/dL — ABNORMAL HIGH (ref 70–99)
Potassium: 4.4 mmol/L (ref 3.5–5.1)
Sodium: 136 mmol/L (ref 135–145)
Total Bilirubin: 0.5 mg/dL (ref 0.0–1.2)
Total Protein: 7.1 g/dL (ref 6.5–8.1)

## 2024-02-06 MED ORDER — PALONOSETRON HCL INJECTION 0.25 MG/5ML
0.2500 mg | Freq: Once | INTRAVENOUS | Status: AC
  Administered 2024-02-06: 0.25 mg via INTRAVENOUS
  Filled 2024-02-06: qty 5

## 2024-02-06 MED ORDER — SODIUM CHLORIDE 0.9 % IV SOLN
135.0000 mg/m2 | Freq: Once | INTRAVENOUS | Status: AC
  Administered 2024-02-06: 240 mg via INTRAVENOUS
  Filled 2024-02-06: qty 40

## 2024-02-06 MED ORDER — SODIUM CHLORIDE 0.9 % IV SOLN
346.0000 mg | Freq: Once | INTRAVENOUS | Status: AC
  Administered 2024-02-06: 350 mg via INTRAVENOUS
  Filled 2024-02-06: qty 35

## 2024-02-06 MED ORDER — DIPHENHYDRAMINE HCL 50 MG/ML IJ SOLN
25.0000 mg | Freq: Once | INTRAMUSCULAR | Status: AC
  Administered 2024-02-06: 25 mg via INTRAVENOUS
  Filled 2024-02-06: qty 1

## 2024-02-06 MED ORDER — DEXAMETHASONE SODIUM PHOSPHATE 10 MG/ML IJ SOLN
10.0000 mg | Freq: Once | INTRAMUSCULAR | Status: AC
  Administered 2024-02-06: 10 mg via INTRAVENOUS
  Filled 2024-02-06: qty 1

## 2024-02-06 MED ORDER — APREPITANT 130 MG/18ML IV EMUL
130.0000 mg | Freq: Once | INTRAVENOUS | Status: AC
  Administered 2024-02-06: 130 mg via INTRAVENOUS
  Filled 2024-02-06: qty 18

## 2024-02-06 MED ORDER — SODIUM CHLORIDE 0.9 % IV SOLN
INTRAVENOUS | Status: DC
  Filled 2024-02-06: qty 250

## 2024-02-06 MED ORDER — FAMOTIDINE IN NACL 20-0.9 MG/50ML-% IV SOLN
20.0000 mg | Freq: Once | INTRAVENOUS | Status: AC
  Administered 2024-02-06: 20 mg via INTRAVENOUS
  Filled 2024-02-06: qty 50

## 2024-02-06 NOTE — Progress Notes (Signed)
 Palliative Medicine [facility] at [facility] Telephone:([phone]) [phone] Fax:([phone]) [phone]   Name: Beth Hart Date: 02/06/2024 MRN: [id number]  DOB: [birth year]  Patient Care Team: Bair, Kalpana, MD as PCP - General (Family Medicine) Maurie Rayfield BIRCH, RN as Oncology Nurse Navigator Babara Call, MD as Consulting Physician (Oncology)    REASON FOR CONSULTATION: Beth Hart is a 73 y.o. 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  Patient is referred to palliative care to address goals and manage ongoing symptoms.  SOCIAL HISTORY:     reports that she has never smoked. She has never used smokeless tobacco. She reports that she does not drink alcohol and does not use drugs.  Patient is married and lives at home with her husband.  ADVANCE DIRECTIVES:  On file  CODE STATUS:   PAST MEDICAL HISTORY: Past Medical History:  Diagnosis Date   Anemia    Arthritis    Asthma    as a child   Chicken pox    Colon polyps 2012   Depression    Hemorrhoids    Ovarian cancer (HCC)     PAST SURGICAL HISTORY:  Past Surgical History:  Procedure Laterality Date   BREAST CYST ASPIRATION Left 90s   BREAST SURGERY  2007   aspiration   CARPAL TUNNEL RELEASE  2010   REFRACTIVE SURGERY     TONSILLECTOMY      HEMATOLOGY/ONCOLOGY HISTORY:  Oncology History  Peritoneal carcinomatosis (HCC)  09/18/2023 Initial Diagnosis   Peritoneal carcinomatosis (HCC)   10/10/2023 -  Chemotherapy   Patient is on Treatment Plan : OVARIAN Carboplatin  (AUC 6) + Paclitaxel  (175) q21d X 6 Cycles      Genetic Testing   Negative genetic testing. No pathogenic variants identified on the [employer] Cancer[employer]+RNA panel. The report date is 10/20/2023.  The [facility] Cancer[employer]+RNAinsight Panel includes sequencing, rearrangement analysis, and RNA analysis for the following 40 genes: APC, ATM,  BAP1, BARD1, BMPR1A, BRCA1, BRCA2, BRIP1, CDH1, CDKN2A, CHEK2, FH, FLCN, MET, MLH1, MSH2, MSH6, MUTYH, NF1, NTHL1, PALB2, PMS2, PTEN, RAD51C, RAD51D, RPS20, SMAD4, STK11, TP53, TSC1, TSC2, and VHL (sequencing and deletion/duplication); AXIN2, HOXB13, MBD4, MSH3, POLD1 and POLE (sequencing only); EPCAM and GREM1 (deletion/duplication only).   Ovarian cancer (HCC)  09/25/2023 Cancer Staging   Staging form: Ovary, Fallopian Tube, and Primary Peritoneal Carcinoma, AJCC 8th Edition - Clinical stage from 09/25/2023: FIGO Stage IV (cT3c, cN1b, cM1) - Signed by Babara Call, MD on 10/05/2023 Stage prefix: Initial diagnosis   10/04/2023 Initial Diagnosis   Ovarian cancer  She experiences pressure and sharp pain in her abdomen, initially attributing it to a pulled muscle from yard work. The pain persisted and was accompanied by a fever, prompting her to visit the ER on 09/16/2023. The fever was around 100F at home and remained at that level upon arrival at the ER. She has a persistent cough, especially when lying down.   09/16/2023, CT abdomen pelvis with contrast showed extensive omental and peritoneal carcinomatosis with small to moderate ascites. multiple hypoattenuating masses in the liver and spleen, compatible with metastases. There are also multiple mildly enlarged retroperitoneal lymph nodes, also highly concerning for metastases.  09/24/2023, cytology from peritoneal ascites showed negative for malignancy. 09/25/2023, endometrial biopsy showed no malignancy. 09/26/2023 Patient underwent peritoneal mass biopsy,  1. Peritoneum, biopsy, RUQ mass :       - METASTATIC HIGH-GRADE SEROUS CARCINOMA WITH NECROSIS, SEE NOTE  Diagnosis Note : Immunohistochemical stains show that the tumor cells are positive for PAX8, p16 and p53 (clonal overexpression pattern) while they are negative for p63 and CK5/6.  This immunoprofile is consistent with above interpretation.    Tumor Testing: [facility] shows HRD but no  somatic BRCA mutation Genomic instability  score of 43.   10/10/2023 -  Chemotherapy   Patient is on Treatment Plan : OVARIAN Carboplatin  (AUC 6) + Paclitaxel  (175) q21d X 6 Cycles      Imaging   CT chest w contrast  Several scattered sub 5 mm bilateral lung nodules identified. Based on appearance and other findings these are worrisome for early lung metastases. Few enlarged pre cardiophrenic mediastinal nodes as well as left internal mammary chain lymph nodes also worrisome for spread of disease.Tiny left pleural effusion.   Please correlate with previous abdomen pelvis CT describing abdominal findings.      Genetic Testing   Negative genetic testing. No pathogenic variants identified on the [employer] Cancer[employer]+RNA panel. The report date is 10/20/2023.  The [employer] CancerNext+RNAinsight Panel includes sequencing, rearrangement analysis, and RNA analysis for the following 40 genes: APC, ATM, BAP1, BARD1, BMPR1A, BRCA1, BRCA2, BRIP1, CDH1, CDKN2A, CHEK2, FH, FLCN, MET, MLH1, MSH2, MSH6, MUTYH, NF1, NTHL1, PALB2, PMS2, PTEN, RAD51C, RAD51D, RPS20, SMAD4, STK11, TP53, TSC1, TSC2, and VHL (sequencing and deletion/duplication); AXIN2, HOXB13, MBD4, MSH3, POLD1 and POLE (sequencing only); EPCAM and GREM1 (deletion/duplication only).   01/07/2024 Surgery   [facility] surgery  Pathology showed  A. Omentum, omentectomy:   High-grade serous carcinoma (7.5 cm).     B. Left fallopian tube and ovary, left salpingo-oophorectomy:   High-grade serous carcinoma with surface involvement of the ovary. Benign fallopian tube.     C. Right fallopian tube and ovary, right salpingo-oophorectomy:   High-grade serous carcinoma with surface involvement of the ovary and fallopian tube.     D. Uterus and cervix, hysterectomy:   Uterus (39 grams): Endometrium: Inactive. Myometrium: Benign.  Cervix: Benign. Serosa: High-grade serous carcinoma.  TUMOR    Tumor Site:    Bilateral ovaries     Tumor Size:     Greatest Dimension (Centimeters): 1.7 cm     Histologic Type:    High-grade serous carcinoma     Ovarian Surface Involvement:    Present, right and left     Fallopian Tube Surface Involvement:    Present, right     Other Tissue / Organ Involvement:    Right ovary     Other Tissue / Organ Involvement:    Left ovary     Other Tissue / Organ Involvement:    Right fallopian tube     Other Tissue / Organ Involvement:    Omentum     Other Tissue / Organ Involvement:    uterine serosa     Largest Extrapelvic Peritoneal Focus:    Macroscopic (greater than 2 cm): Omentum     Peritoneal / Ascitic Fluid Involvement:    Not submitted / unknown     Chemotherapy Response Score (CRS):    CRS1 (no definite or minimal response)   REGIONAL LYMPH NODES     Regional Lymph Node Status:    Not applicable (no regional lymph nodes submitted or found)   pTNM CLASSIFICATION (AJCC 8th Edition)     Reporting of pT, pN, and (when applicable) pM categories is based on information available to the pathologist at the time the report is issued. As per the AJCC (Chapter 1, 8th Ed.) it  is the managing physician's responsibility to establish the final pathologic stage based upon all pertinent information, including but potentially not limited to this pathology report.     Modified Classification:    y     pT Category:    pT3c     pN Category:    pN not assigned (no nodes submitted or found)        ALLERGIES:  has no known allergies.  MEDICATIONS:  Current Outpatient Medications  Medication Sig Dispense Refill   citalopram  (CELEXA ) 40 MG tablet Take 1 tablet (40 mg total) by mouth daily. 30 tablet 3   dexamethasone  (DECADRON ) 4 MG tablet Take 2 tablets (8mg ) by mouth daily starting the day after carboplatin  for 3 days. Take with food 30 tablet 1   lactulose  (CHRONULAC ) 10 GM/15ML solution take15 to 30 ml 3 times a day when needed for constipation 236 mL 0   nystatin  (MYCOSTATIN ) 100000 UNIT/ML suspension Take 5 mLs  (500,000 Units total) by mouth 4 (four) times daily. Swish and swallow (Patient not taking: Reported on 02/06/2024) 60 mL 0   omeprazole  (PRILOSEC) 20 MG capsule Take 1 capsule (20 mg total) by mouth daily. (Patient not taking: Reported on 02/06/2024) 30 capsule 0   ondansetron  (ZOFRAN ) 8 MG tablet Take 1 tablet (8 mg total) by mouth every 8 (eight) hours as needed for nausea or vomiting. Start on the third day after carboplatin . 30 tablet 1   oxyCODONE  (OXY IR/ROXICODONE ) 5 MG immediate release tablet Take 1 tablet (5 mg total) by mouth every 8 (eight) hours as needed for severe pain (pain score 7-10). 30 tablet 0   polyethylene glycol powder (GLYCOLAX/MIRALAX) 17 GM/SCOOP powder Take by mouth once. (Patient taking differently: Take 17 g by mouth daily as needed.)     prochlorperazine  (COMPAZINE ) 10 MG tablet Take 1 tablet (10 mg total) by mouth every 6 (six) hours as needed for nausea or vomiting. 30 tablet 1   senna (SENOKOT) 8.6 MG TABS tablet Take 1 tablet (8.6 mg total) by mouth daily as needed for mild constipation. (Patient taking differently: Take 1 tablet by mouth daily.) 120 tablet 0   No current facility-administered medications for this visit.   Facility-Administered Medications Ordered in Other Visits  Medication Dose Route Frequency Provider Last Rate Last Admin   0.9 %  sodium chloride  infusion   Intravenous Continuous Babara Call, MD 10 mL/hr at 02/06/24 0950 New Bag at 02/06/24 0950   CARBOplatin  (PARAPLATIN ) 350 mg in sodium chloride  0.9 % 100 mL chemo infusion  350 mg Intravenous Once Babara Call, MD       heparin  lock flush 100 unit/mL  500 Units Intravenous Once Babara Call, MD       PACLitaxel  (TAXOL ) 240 mg in sodium chloride  0.9 % 250 mL chemo infusion (> 80mg /m2)  135 mg/m2 (Treatment Plan Recorded) Intravenous Once Babara Call, MD 97 mL/hr at 02/06/24 1045 240 mg at 02/06/24 1045    VITAL SIGNS: There were no vitals taken for this visit. There were no vitals filed for this visit.   Estimated body mass index is 29.36 kg/m as calculated from the following:   Height as of 11/07/23: 5' 2 (1.575 m).   Weight as of an earlier encounter on 02/06/24: 160 lb 8 oz (72.8 kg).  LABS: CBC:    Component Value Date/Time   WBC 6.4 02/06/2024 0807   WBC 8.7 09/26/2023 1041   HGB 11.0 (L) 02/06/2024 0807   HCT 34.4 (L) 02/06/2024 9192  PLT 233 02/06/2024 0807   MCV 101.8 (H) 02/06/2024 0807   NEUTROABS 3.3 02/06/2024 0807   NEUTROABS 3,830 06/12/2018 0000   LYMPHSABS 2.0 02/06/2024 0807   MONOABS 0.6 02/06/2024 0807   EOSABS 0.3 02/06/2024 0807   BASOSABS 0.1 02/06/2024 0807   Comprehensive Metabolic Panel:    Component Value Date/Time   NA 136 02/06/2024 0807   NA 140 12/18/2018 0000   K 4.4 02/06/2024 0807   CL 105 02/06/2024 0807   CO2 23 02/06/2024 0807   BUN 28 (H) 02/06/2024 0807   BUN 20 12/18/2018 0000   CREATININE 1.21 (H) 02/06/2024 0807   GLUCOSE 108 (H) 02/06/2024 0807   CALCIUM 9.3 02/06/2024 0807   AST 26 02/06/2024 0807   ALT 22 02/06/2024 0807   ALKPHOS 103 02/06/2024 0807   BILITOT 0.5 02/06/2024 0807   PROT 7.1 02/06/2024 0807   ALBUMIN 3.8 02/06/2024 0807    RADIOGRAPHIC STUDIES: No results found.   PERFORMANCE STATUS (ECOG) : 1 - Symptomatic but completely ambulatory  Review of Systems Unless otherwise noted, a complete review of systems is negative.  Physical Exam General: NAD Pulmonary: Unlabored Extremities: no edema, no joint deformities Skin: no rashes Neurological: Weakness but otherwise nonfocal  IMPRESSION: Follow-up visit.  Patient seen in infusion.  Patient states she is doing well.  Denies any significant changes or concerns.  No symptomatic complaints at present.  Patient reports improved appetite.  Mood stable.  Sleeping well.  She feels she is tolerating treatments well and is in agreement with current scope/plan.  PLAN: - Continue current scope of treatment - Continue citalopram  40 mg daily - Follow-up  telephone visit 2 to 3 months   Patient expressed understanding and was in agreement with this plan. She also understands that She can call the clinic at any time with any questions, concerns, or complaints.     Time Total: 20 minutes  Visit consisted of counseling and education dealing with the complex and emotionally intense issues of symptom management and palliative care in the setting of serious and potentially life-threatening illness.Greater than 50%  of this time was spent counseling and coordinating care related to the above assessment and plan.  Signed by: Fonda Mower, PhD, NP-C

## 2024-02-06 NOTE — Assessment & Plan Note (Signed)
 Chemotherapy treatments as listed above.

## 2024-02-06 NOTE — Assessment & Plan Note (Signed)
 Grade 1/2  Monitor.

## 2024-02-06 NOTE — Patient Instructions (Signed)
 [facility] - A DEPT OF [facility]  Discharge Instructions: Thank you for choosing [facility] to provide your oncology and hematology care.  If you have a lab appointment with the Cancer Center, please go directly to the Cancer Center and check in at the registration area.  Wear comfortable clothing and clothing appropriate for easy access to any Portacath or PICC line.   We strive to give you quality time with your provider. You may need to reschedule your appointment if you arrive late (15 or more minutes).  Arriving late affects you and other patients whose appointments are after yours.  Also, if you miss three or more appointments without notifying the office, you may be dismissed from the clinic at the provider's discretion.      For prescription refill requests, have your pharmacy contact our office and allow 72 hours for refills to be completed.    Today you received the following chemotherapy and/or immunotherapy agents Paclitaxel, Carboplatin      To help prevent nausea and vomiting after your treatment, we encourage you to take your nausea medication as directed.  BELOW ARE SYMPTOMS THAT SHOULD BE REPORTED IMMEDIATELY: *FEVER GREATER THAN 100.4 F (38 C) OR HIGHER *CHILLS OR SWEATING *NAUSEA AND VOMITING THAT IS NOT CONTROLLED WITH YOUR NAUSEA MEDICATION *UNUSUAL SHORTNESS OF BREATH *UNUSUAL BRUISING OR BLEEDING *URINARY PROBLEMS (pain or burning when urinating, or frequent urination) *BOWEL PROBLEMS (unusual diarrhea, constipation, pain near the anus) TENDERNESS IN MOUTH AND THROAT WITH OR WITHOUT PRESENCE OF ULCERS (sore throat, sores in mouth, or a toothache) UNUSUAL RASH, SWELLING OR PAIN  UNUSUAL VAGINAL DISCHARGE OR ITCHING   Items with * indicate a potential emergency and should be followed up as soon as possible or go to the Emergency Department if any problems should occur.  Please show the CHEMOTHERAPY ALERT CARD or  IMMUNOTHERAPY ALERT CARD at check-in to the Emergency Department and triage nurse.  Should you have questions after your visit or need to cancel or reschedule your appointment, please contact [facility] - A DEPT OF [facility]  [phone] and follow the prompts.  Office hours are 8:00 a.m. to 4:30 p.m. Monday - Friday. Please note that voicemails left after 4:00 p.m. may not be returned until the following business day.  We are closed weekends and major holidays. You have access to a nurse at all times for urgent questions. Please call the main number to the clinic [phone] and follow the prompts.  For any non-urgent questions, you may also contact your provider using MyChart. We now offer e-Visits for anyone 23 and older to request care online for non-urgent symptoms. For details visit [email or web address].   Also download the MyChart app! Go to the app store, search "MyChart", open the app, select [facility], and log in with your MyChart username and password.

## 2024-02-06 NOTE — Progress Notes (Signed)
 Hematology/Oncology Progress note [phone] [phone]    CHIEF COMPLAINTS/REASON FOR VISIT:  High-grade serous carcinoma of ovaries. peritoneal carcinomatosis, liver lesion.   ASSESSMENT & PLAN:    Cancer Staging  Ovarian cancer [facility]) Staging form: Ovary, Fallopian Tube, and Primary Peritoneal Carcinoma, AJCC 8th Edition - Clinical stage from 09/25/2023: FIGO Stage IV (cT3c, cN1b, cM1) - Signed by Babara Call, MD on 10/05/2023   Ovarian cancer [facility]) Image findings and pathology results were reviewed and discussed with patient. 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  Normal CEA.  Elevated CA125 609, elevated CA 27.29 -415  S/p neoadjuvant chemotherapy with carboplatin /Taxol  x 4 Bevacizumab was not given in neoadjuvant setting due to tumor abutting bowel wall.  HRD positive  S/p  debulking surgery.  Recommend additional 3 cycles of chemotherapy. Labs are reviewed and discussed with patient. Proceed with cycle 5 Carboplatin  Taxol   Plan to add Bevacizumab to cycle 6. Rationale and side effects were reviewed with patient. She agrees.       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   CKD (chronic kidney disease) stage 3, GFR 30-59 ml/min (HCC) Encouraged oral hydration, avoid nephrotoxins.   Encounter for antineoplastic chemotherapy Chemotherapy treatments as listed above.   Orders Placed This Encounter  Procedures   CA 125    Standing Status:   Future    Expected Date:   02/27/2024    Expiration Date:   02/26/2025   CBC with Differential (Cancer Center Only)    Standing Status:   Future    Expected Date:   02/27/2024    Expiration Date:   02/26/2025   CMP (Cancer Center only)    Standing Status:   Future    Expected Date:   02/27/2024    Expiration Date:   02/26/2025   Total Protein, Urine dipstick    Standing Status:   Future     Expected Date:   02/27/2024    Expiration Date:   02/26/2025   Follow-up 3 weeks.   All questions were answered. The patient knows to call the clinic with any problems, questions or concerns.  Call Babara, MD, PhD [facility] Hematology Oncology 02/06/2024   HISTORY OF PRESENTING ILLNESS:   Beth Hart is a  73 y.o.  female with PMH listed below presents for follow up of stage IV ovarian high grade serous carcinoma.  Oncology History  Peritoneal carcinomatosis (HCC)  09/18/2023 Initial Diagnosis   Peritoneal carcinomatosis (HCC)   10/10/2023 -  Chemotherapy   Patient is on Treatment Plan : OVARIAN Carboplatin  (AUC 6) + Paclitaxel  (175) q21d X 6 Cycles      Genetic Testing   Negative genetic testing. No pathogenic variants identified on the [facility] CancerNext+RNA panel. The report date is 10/20/2023.  The [facility] CancerNext+RNAinsight Panel includes sequencing, rearrangement analysis, and RNA analysis for the following 40 genes: APC, ATM, BAP1, BARD1, BMPR1A, BRCA1, BRCA2, BRIP1, CDH1, CDKN2A, CHEK2, FH, FLCN, MET, MLH1, MSH2, MSH6, MUTYH, NF1, NTHL1, PALB2, PMS2, PTEN, RAD51C, RAD51D, RPS20, SMAD4, STK11, TP53, TSC1, TSC2, and VHL (sequencing and deletion/duplication); AXIN2, HOXB13, MBD4, MSH3, POLD1 and POLE (sequencing only); EPCAM and GREM1 (deletion/duplication only).   Ovarian cancer (HCC)  09/25/2023 Cancer Staging   Staging form: Ovary, Fallopian Tube, and Primary Peritoneal Carcinoma, AJCC 8th Edition - Clinical stage from 09/25/2023: FIGO Stage IV (cT3c, cN1b, cM1) - Signed by Babara Call, MD on 10/05/2023 Stage prefix: Initial diagnosis   10/04/2023  Initial Diagnosis   Ovarian cancer  She experiences pressure and sharp pain in her abdomen, initially attributing it to a pulled muscle from yard work. The pain persisted and was accompanied by a fever, prompting her to visit the ER on 09/16/2023. The fever was around 100F at home and remained at that level upon arrival at the ER. She has  a persistent cough, especially when lying down.   09/16/2023, CT abdomen pelvis with contrast showed extensive omental and peritoneal carcinomatosis with small to moderate ascites. multiple hypoattenuating masses in the liver and spleen, compatible with metastases. There are also multiple mildly enlarged retroperitoneal lymph nodes, also highly concerning for metastases.  09/24/2023, cytology from peritoneal ascites showed negative for malignancy. 09/25/2023, endometrial biopsy showed no malignancy. 09/26/2023 Patient underwent peritoneal mass biopsy,  1. Peritoneum, biopsy, RUQ mass :       - METASTATIC HIGH-GRADE SEROUS CARCINOMA WITH NECROSIS, SEE NOTE   Diagnosis Note : Immunohistochemical stains show that the tumor cells are positive for PAX8, p16 and p53 (clonal overexpression pattern) while they are negative for p63 and CK5/6.  This immunoprofile is consistent with above interpretation.    Tumor Testing: [facility] shows HRD but no somatic BRCA mutation Genomic instability  score of 43.   10/10/2023 -  Chemotherapy   Patient is on Treatment Plan : OVARIAN Carboplatin  (AUC 6) + Paclitaxel  (175) q21d X 6 Cycles      Imaging   CT chest w contrast  Several scattered sub 5 mm bilateral lung nodules identified. Based on appearance and other findings these are worrisome for early lung metastases. Few enlarged pre cardiophrenic mediastinal nodes as well as left internal mammary chain lymph nodes also worrisome for spread of disease.Tiny left pleural effusion.   Please correlate with previous abdomen pelvis CT describing abdominal findings.      Genetic Testing   Negative genetic testing. No pathogenic variants identified on the [facility] CancerNext+RNA panel. The report date is 10/20/2023.  The [facility] CancerNext+RNAinsight Panel includes sequencing, rearrangement analysis, and RNA analysis for the following 40 genes: APC, ATM, BAP1, BARD1, BMPR1A, BRCA1, BRCA2, BRIP1, CDH1, CDKN2A, CHEK2, FH,  FLCN, MET, MLH1, MSH2, MSH6, MUTYH, NF1, NTHL1, PALB2, PMS2, PTEN, RAD51C, RAD51D, RPS20, SMAD4, STK11, TP53, TSC1, TSC2, and VHL (sequencing and deletion/duplication); AXIN2, HOXB13, MBD4, MSH3, POLD1 and POLE (sequencing only); EPCAM and GREM1 (deletion/duplication only).   01/07/2024 Surgery   Debulking surgery  Pathology showed  A. Omentum, omentectomy:   High-grade serous carcinoma (7.5 cm).     B. Left fallopian tube and ovary, left salpingo-oophorectomy:   High-grade serous carcinoma with surface involvement of the ovary. Benign fallopian tube.     C. Right fallopian tube and ovary, right salpingo-oophorectomy:   High-grade serous carcinoma with surface involvement of the ovary and fallopian tube.     D. Uterus and cervix, hysterectomy:   Uterus (39 grams): Endometrium: Inactive. Myometrium: Benign.  Cervix: Benign. Serosa: High-grade serous carcinoma.  TUMOR    Tumor Site:    Bilateral ovaries     Tumor Size:    Greatest Dimension (Centimeters): 1.7 cm     Histologic Type:    High-grade serous carcinoma     Ovarian Surface Involvement:    Present, right and left     Fallopian Tube Surface Involvement:    Present, right     Other Tissue / Organ Involvement:    Right ovary     Other Tissue / Organ Involvement:    Left ovary  Other Tissue / Organ Involvement:    Right fallopian tube     Other Tissue / Organ Involvement:    Omentum     Other Tissue / Organ Involvement:    uterine serosa     Largest Extrapelvic Peritoneal Focus:    Macroscopic (greater than 2 cm): Omentum     Peritoneal / Ascitic Fluid Involvement:    Not submitted / unknown     Chemotherapy Response Score (CRS):    CRS1 (no definite or minimal response)   REGIONAL LYMPH NODES     Regional Lymph Node Status:    Not applicable (no regional lymph nodes submitted or found)   pTNM CLASSIFICATION (AJCC 8th Edition)     Reporting of pT, pN, and (when applicable) pM categories is based on information  available to the pathologist at the time the report is issued. As per the AJCC (Chapter 1, 8th Ed.) it is the managing physician's responsibility to establish the final pathologic stage based upon all pertinent information, including but potentially not limited to this pathology report.     Modified Classification:    y     pT Category:    pT3c     pN Category:    pN not assigned (no nodes submitted or found)       Her family history includes father who had lung cancer attributed to smoking, but no history of colon, ovarian, or breast cancer.  She underwent a colonoscopy a few years ago, which was reported to be negative.  And a mammogram six months ago, which was negative she does not recall any recent stomach pain, dysphagia, or acid reflux.  INTERVAL HISTORY [employer] is a 73 y.o. female who has above history reviewed by me today presents for follow up visit for Stage IV ovarian cancer.  She s/p debulking surgery.  Surgical wound is healing well. She reports feeling well. No abdominal pain.     MEDICAL HISTORY:  Past Medical History:  Diagnosis Date   Anemia    Arthritis    Asthma    as a child   Chicken pox    Colon polyps 2012   Depression    Hemorrhoids    Ovarian cancer (HCC)     SURGICAL HISTORY: Past Surgical History:  Procedure Laterality Date   BREAST CYST ASPIRATION Left 90s   BREAST SURGERY  2007   aspiration   CARPAL TUNNEL RELEASE  2010   REFRACTIVE SURGERY     TONSILLECTOMY      SOCIAL HISTORY: Social History   Socioeconomic History   Marital status: Married    Spouse name: Not on file   Number of children: Not on file   Years of education: Not on file   Highest education level: Associate degree: occupational, Scientist, product/process development, or vocational program  Occupational History   Not on file  Tobacco Use   Smoking status: Never   Smokeless tobacco: Never  Vaping Use   Vaping status: Never Used  Substance and Sexual Activity   Alcohol use: No    Drug use: No   Sexual activity: Yes  Other Topics Concern   Not on file  Social History Narrative   married   Social Drivers of Corporate investment banker Strain: Low Risk  (01/07/2024)   Received from [facility]   Overall Financial Resource Strain (CARDIA)    Difficulty of Paying Living Expenses: Not hard at all  Food Insecurity: No Food Insecurity (01/07/2024)  Received from [facility]   Hunger Vital Sign    Within the past 12 months, you worried that your food would run out before you got the money to buy more.: Never true    Within the past 12 months, the food you bought just didn't last and you didn't have money to get more.: Never true  Transportation Needs: No Transportation Needs (01/08/2024)   Received from [facility] - Transportation    In the past 12 months, has lack of transportation kept you from medical appointments or from getting medications?: No    Lack of Transportation (Non-Medical): No  Physical Activity: Inactive (11/03/2023)   Exercise Vital Sign    Days of Exercise per Week: 0 days    Minutes of Exercise per Session: Not on file  Stress: No Stress Concern Present (11/03/2023)   [employer] of Occupational Health - Occupational Stress Questionnaire    Feeling of Stress: Only a little  Social Connections: Socially Integrated (11/03/2023)   Social Connection and Isolation Panel    Frequency of Communication with Friends and Family: More than three times a week    Frequency of Social Gatherings with Friends and Family: Once a week    Attends Religious Services: More than 4 times per year    Active Member of [employer] or Organizations: Yes    Attends Engineer, structural: More than 4 times per year    Marital Status: Married  Catering manager Violence: Not At Risk (09/25/2023)   Humiliation, Afraid, Rape, and Kick questionnaire    Fear of Current or Ex-Partner: No    Emotionally Abused: No     Physically Abused: No    Sexually Abused: No    FAMILY HISTORY: Family History  Problem Relation Age of Onset   Alzheimer's disease Mother    Lung cancer Father    Prostate cancer Maternal Uncle    Prostate cancer Maternal Uncle    Prostate cancer Maternal Uncle    Prostate cancer Maternal Uncle    Prostate cancer Maternal Uncle    Prostate cancer Other        in mat great uncles, unk#   Prostate cancer Cousin        in 4-5 mat  first cousins   Breast cancer Neg Hx     ALLERGIES:  has no known allergies.  MEDICATIONS:  Current Outpatient Medications  Medication Sig Dispense Refill   citalopram  (CELEXA ) 40 MG tablet Take 1 tablet (40 mg total) by mouth daily. 30 tablet 3   dexamethasone  (DECADRON ) 4 MG tablet Take 2 tablets (8mg ) by mouth daily starting the day after carboplatin  for 3 days. Take with food 30 tablet 1   ondansetron  (ZOFRAN ) 8 MG tablet Take 1 tablet (8 mg total) by mouth every 8 (eight) hours as needed for nausea or vomiting. Start on the third day after carboplatin . 30 tablet 1   oxyCODONE  (OXY IR/ROXICODONE ) 5 MG immediate release tablet Take 1 tablet (5 mg total) by mouth every 8 (eight) hours as needed for severe pain (pain score 7-10). 30 tablet 0   polyethylene glycol powder (GLYCOLAX/MIRALAX) 17 GM/SCOOP powder Take by mouth once. (Patient taking differently: Take 17 g by mouth daily as needed.)     prochlorperazine  (COMPAZINE ) 10 MG tablet Take 1 tablet (10 mg total) by mouth every 6 (six) hours as needed for nausea or vomiting. 30 tablet 1   senna (SENOKOT) 8.6 MG TABS tablet Take 1  tablet (8.6 mg total) by mouth daily as needed for mild constipation. (Patient taking differently: Take 1 tablet by mouth daily.) 120 tablet 0   lactulose  (CHRONULAC ) 10 GM/15ML solution take15 to 30 ml 3 times a day when needed for constipation 236 mL 0   nystatin  (MYCOSTATIN ) 100000 UNIT/ML suspension Take 5 mLs (500,000 Units total) by mouth 4 (four) times daily. Swish and  swallow (Patient not taking: Reported on 02/06/2024) 60 mL 0   omeprazole  (PRILOSEC) 20 MG capsule Take 1 capsule (20 mg total) by mouth daily. (Patient not taking: Reported on 02/06/2024) 30 capsule 0   No current facility-administered medications for this visit.   Facility-Administered Medications Ordered in Other Visits  Medication Dose Route Frequency Provider Last Rate Last Admin   0.9 %  sodium chloride  infusion   Intravenous Continuous Babara Call, MD 10 mL/hr at 02/06/24 0950 New Bag at 02/06/24 0950   CARBOplatin  (PARAPLATIN ) 350 mg in sodium chloride  0.9 % 100 mL chemo infusion  350 mg Intravenous Once Babara Call, MD       heparin  lock flush 100 unit/mL  500 Units Intravenous Once Ashlynd Michna, MD       PACLitaxel  (TAXOL ) 240 mg in sodium chloride  0.9 % 250 mL chemo infusion (> 80mg /m2)  135 mg/m2 (Treatment Plan Recorded) Intravenous Once Babara Call, MD 97 mL/hr at 02/06/24 1045 240 mg at 02/06/24 1045    Review of Systems  Constitutional:  Positive for fatigue. Negative for appetite change, chills, fever and unexpected weight change.  HENT:   Negative for hearing loss and voice change.   Eyes:  Negative for eye problems.  Respiratory:  Negative for chest tightness and cough.   Cardiovascular:  Negative for chest pain.  Gastrointestinal:  Negative for abdominal distention, abdominal pain, blood in stool, nausea and vomiting.  Endocrine: Negative for hot flashes.  Genitourinary:  Negative for difficulty urinating and frequency.   Musculoskeletal:  Negative for arthralgias.  Skin:  Negative for itching and rash.  Neurological:  Negative for extremity weakness.  Hematological:  Negative for adenopathy.  Psychiatric/Behavioral:  Positive for depression and sleep disturbance. Negative for confusion.    PHYSICAL EXAMINATION: ECOG PERFORMANCE STATUS: 1 - Symptomatic but completely ambulatory Vitals:   02/06/24 0906  BP: 103/63  Pulse: 90  Resp: 18  Temp: 98.8 F (37.1 C)  SpO2: 97%     Filed Weights   02/06/24 0906  Weight: 160 lb 8 oz (72.8 kg)     Physical Exam Constitutional:      General: She is not in acute distress. HENT:     Head: Normocephalic and atraumatic.  Eyes:     General: No scleral icterus. Cardiovascular:     Rate and Rhythm: Normal rate and regular rhythm.     Heart sounds: Normal heart sounds.  Pulmonary:     Effort: Pulmonary effort is normal. No respiratory distress.     Breath sounds: No wheezing.  Abdominal:     General: Bowel sounds are normal. There is no distension.     Palpations: Abdomen is soft.  Musculoskeletal:        General: No deformity. Normal range of motion.     Cervical back: Normal range of motion and neck supple.  Skin:    General: Skin is warm and dry.     Findings: No erythema or rash.  Neurological:     Mental Status: She is alert and oriented to person, place, and time. Mental status is at baseline.  Psychiatric:        Mood and Affect: Mood normal.     LABORATORY DATA:  I have reviewed the data as listed    Latest Ref Rng & Units 02/06/2024    8:07 AM 01/03/2024    9:18 AM 12/12/2023    8:19 AM  CBC  WBC 4.0 - 10.5 K/uL 6.4  3.9  5.8   Hemoglobin 12.0 - 15.0 g/dL 88.9  89.2  89.2   Hematocrit 36.0 - 46.0 % 34.4  33.3  33.3   Platelets 150 - 400 K/uL 233  196  141       Latest Ref Rng & Units 02/06/2024    8:07 AM 12/12/2023    8:19 AM 12/02/2023    2:17 PM  CMP  Glucose 70 - 99 mg/dL 891  887    BUN 8 - 23 mg/dL 28  36    Creatinine 9.55 - 1.00 mg/dL 8.78  8.77  8.79   Sodium 135 - 145 mmol/L 136  136    Potassium 3.5 - 5.1 mmol/L 4.4  4.6    Chloride 98 - 111 mmol/L 105  105    CO2 22 - 32 mmol/L 23  23    Calcium 8.9 - 10.3 mg/dL 9.3  9.4    Total Protein 6.5 - 8.1 g/dL 7.1  6.7    Total Bilirubin 0.0 - 1.2 mg/dL 0.5  0.5    Alkaline Phos 38 - 126 U/L 103  70    AST 15 - 41 U/L 26  26    ALT 0 - 44 U/L 22  22        RADIOGRAPHIC STUDIES: I have personally reviewed the radiological  images as listed and agreed with the findings in the report. CT CHEST ABDOMEN PELVIS W CONTRAST Result Date: 12/11/2023 EXAM: CT CHEST, ABDOMEN AND PELVIS WITH CONTRAST 12/02/2023 02:33:35 PM TECHNIQUE: CT of the chest, abdomen and pelvis was performed with the administration of intravenous contrast. Multiplanar reformatted images are provided for review. Automated exposure control, iterative reconstruction, and/or weight based adjustment of the mA/kV was utilized to reduce the radiation dose to as low as reasonably achievable. COMPARISON: CT chest from 10/03/2023 and CT abdomen and pelvis from 09/16/2023. CLINICAL HISTORY: Ovarian cancer. 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 FINDINGS: CHEST: MEDIASTINUM: Heart and pericardium are unremarkable. The central airways are clear. THORACIC LYMPH NODES: No mediastinal, hilar or axillary lymphadenopathy. LUNGS AND PLEURA: Right lower lobe lung nodule measures 3 mm, image 64/4, previously 5 mm. The previous 4 mm right lower lobe lung nodule has resolved in the interval. Previous 4 mm nodule within the lower right lower lobe measures 2 mm, image 92/4, previously 4 mm. Calcified granuloma identified within the left upper lobe. No pleural effusion or pneumothorax. ABDOMEN AND PELVIS: LIVER: Unchanged cyst along the dome of left lobe of liver measuring 2.3 cm, image 46/2. Inferior right lobe of liver metastasis measures 3.5 x 2.2 cm, image 59/2, previously 4.5 x 3.5 cm. Index lesion within segment 5 measures 1.3 x 0.9 cm, image 59/2, previously 1.6 x 1.1  cm. No new liver lesions. GALLBLADDER AND  BILE DUCTS: Gallstones are again noted. No biliary ductal dilatation. Common bile duct is upper limits of normal measuring 6 mm, similar to previous exam. SPLEEN: Multiple splenic metastases. Index lesion within the posterior lateral spleen measures 2.4 x 1.6 cm, image 55/2, previously 3.2 x 2.0 cm. Inferior splenic lesion measures 3.1 x 2.5 cm, previously this measured the same. PANCREAS: No pancreatic inflammation, main duct dilatation or mass. ADRENAL GLANDS: Normal adrenal glands. KIDNEYS, URETERS AND BLADDER: Bilateral renal cortical scarring and volume loss noted. No nephrolithiasis, hydronephrosis or suspicious mass. No bladder abnormality. GI AND BOWEL: No signs of bowel obstruction. Moderate stool burden noted throughout the colon. PERITONEUM AND RETROPERITONEUM: Peritoneal metastasis again noted. Index lesion within the left hemiabdomen along the undersurface of the ventral abdominal wall measures 2.6 x 1.6 cm, image 75/2, on the previous exam this measured 4.3 x 2.6 cm. At a slightly higher level peritoneal nodule measures 2.2 x 1.8 cm, image 70/2, previously 3.1 x 2.6 cm. Soft tissue nodularity along the undersurface of both hemidiaphragms is also improved in the interval. Left upper quadrant peritoneal nodule measures 2.5 x 1.8 cm, image 63/2, on the previous exam is measured 3.3 x 2.4 cm. Interval resolution of previously noted ascites. VASCULATURE: Aorta is normal in caliber. ABDOMINAL AND PELVIS LYMPH NODES: Abdominal pelvic adenopathy is again noted. Index aortocaval lymph node measures 1 cm short axis, image 77/2, previously 1.1 cm. Index lymph node left periaortic lymph node measures 1.4 cm, image 60/2, previously 1.5 cm. Left common iliac lymph node measures 0.9 cm, image 84/2, previously 1.2 cm. Right external iliac node measures 0.9 cm, image 101/2, previously 1 cm. REPRODUCTIVE ORGANS: No focal abnormality involving the uterus. BONES AND SOFT TISSUES:  No acute or suspicious osseous findings. IMPRESSION: 1. Interval improvement in peritoneal metastases, splenic metastases, and liver metastases. 2. Interval resolution of previously noted ascites. 3. Abdominal pelvic adenopathy, improved in the interval. 4. Interval improvement in right lower lobe lung nodules. Electronically signed by: Waddell Calk MD 12/11/2023 09:22 AM EDT RP Workstation: [id number]

## 2024-02-06 NOTE — Assessment & Plan Note (Signed)
 Encouraged oral hydration, avoid nephrotoxins.

## 2024-02-06 NOTE — Assessment & Plan Note (Addendum)
 Image findings and pathology results were reviewed and discussed with patient. 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  Normal CEA.  Elevated CA125 609, elevated CA 27.29 -415  S/p neoadjuvant chemotherapy with carboplatin /Taxol  x 4 Bevacizumab was not given in neoadjuvant setting due to tumor abutting bowel wall.  HRD positive  S/p  debulking surgery.  Recommend additional 3 cycles of chemotherapy. Labs are reviewed and discussed with patient. Proceed with cycle 5 Carboplatin  Taxol   Plan to add Bevacizumab to cycle 6. Rationale and side effects were reviewed with patient. She agrees.

## 2024-02-06 NOTE — Addendum Note (Signed)
 Addended by: BABARA CALL on: 02/06/2024 01:48 PM   Modules accepted: Orders

## 2024-02-07 LAB — CA 125: Cancer Antigen (CA) 125: 124 U/mL — ABNORMAL HIGH (ref 0.0–38.1)

## 2024-02-10 ENCOUNTER — Telehealth

## 2024-02-10 DIAGNOSIS — F330 Major depressive disorder, recurrent, mild: Secondary | ICD-10-CM

## 2024-02-10 NOTE — Progress Notes (Signed)
 Virtual Visit via Video Note  I connected with Beth Hart on 02/10/24 at  4:00 PM EDT by a video enabled telemedicine application and verified that I am speaking with the correct person using two identifiers.  Patient Location: Home Provider Location: Office/Clinic  I discussed the limitations, risks, security, and privacy concerns of performing an evaluation and management service by video and the availability of in person appointments. I also discussed with the patient that there may be a patient responsible charge related to this service. The patient expressed understanding and agreed to proceed.  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   She is following up with oncologist Dr. Babara and recently underwent debulking surgery. She is currently going through chemotherapy and reports she is tolerating the treatment, although she gets flu-like symptoms specially on day 4-5 of treatment, during which she rests. Following this period, she is able to perform normal household activities.  She is currently taking Celexa  40 mg daily and Senna regularly, with other medications, including those for pain and constipation, taken as needed. She has not required Prilosec recently. No fevers or chills are reported.   Her husband has been very supportive, taking on additional responsibilities at home, especially as his mother is in hospice care. His mother is 44 years old and has dementia.  She has gained some weight, which she attributes to steroids, and has chosen not to wear a wig, embracing her current appearance.    ROS: Per HPI  Current Outpatient Medications:    citalopram  (CELEXA ) 40 MG tablet, Take 1 tablet (40 mg total) by mouth daily., Disp: 30 tablet, Rfl: 3    dexamethasone  (DECADRON ) 4 MG tablet, Take 2 tablets (8mg ) by mouth daily starting the day after carboplatin  for 3 days. Take with food, Disp: 30 tablet, Rfl: 1   ondansetron  (ZOFRAN ) 8 MG tablet, Take 1 tablet (8 mg total) by mouth every 8 (eight) hours as needed for nausea or vomiting. Start on the third day after carboplatin ., Disp: 30 tablet, Rfl: 1   polyethylene glycol powder (GLYCOLAX/MIRALAX) 17 GM/SCOOP powder, Take by mouth once., Disp: , Rfl:    senna (SENOKOT) 8.6 MG TABS tablet, Take 1 tablet (8.6 mg total) by mouth daily as needed for mild constipation., Disp: 120 tablet, Rfl: 0   oxyCODONE  (OXY IR/ROXICODONE ) 5 MG immediate release tablet, Take 1 tablet (5 mg total) by mouth every 8 (eight) hours as needed for severe pain (pain score 7-10). (Patient not taking: Reported on 02/10/2024), Disp: 30 tablet, Rfl: 0   prochlorperazine  (COMPAZINE ) 10 MG tablet, Take 1 tablet (10 mg total) by mouth every 6 (six) hours as needed for nausea or vomiting. (Patient not taking: Reported on 02/10/2024), Disp: 30 tablet, Rfl: 1 No current facility-administered medications for this visit.  Facility-Administered Medications Ordered in Other Visits:    heparin  lock flush 100 unit/mL, 500 Units, Intravenous, Once, Babara Call, MD  Observations/Objective: There were no vitals filed for this visit. Physical Exam Vitals reviewed: Limited due to nature of virtual visit.  Neurological:     Mental Status: She is alert and oriented to person, place, and time.  Psychiatric:        Mood and Affect: Mood normal.        Behavior: Behavior is cooperative.  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 Continue current chemotherapy regimen. Continue f/u with palliative care as scheduled. Encourage patient to reach out to us  for follow up visit. Reports well-being  and positive outlook with Celexa  40 mg daily, continue.       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 Continue current chemotherapy regimen. Continue f/u with palliative care as scheduled. Encourage patient to reach out to us  for follow up visit. Reports well-being and positive outlook with Celexa  40 mg daily, continue.       Follow Up Instructions: Return if symptoms worsen or fail to improve, for Patient will call us  to schedule a follow up visit .   I discussed the assessment and treatment plan with the patient. The patient was provided an opportunity to ask questions, and all were answered. The patient agreed with the plan and demonstrated an understanding of the instructions.   The patient was advised to call back or seek an in-person evaluation if the symptoms worsen or if the condition fails to improve as anticipated.  The above assessment and management plan was discussed with the patient. The patient verbalized understanding of and has agreed to the management plan.   Luke Shade, MD

## 2024-02-10 NOTE — Assessment & Plan Note (Signed)
 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 Continue current chemotherapy regimen. Continue f/u with palliative care as scheduled. Encourage patient to reach out to us  for follow up visit. Reports well-being and positive outlook with Celexa  40 mg daily, continue.

## 2024-02-10 NOTE — Assessment & Plan Note (Signed)
 Stage IV high-grade serous carcinoma with peritoneal carcinomatosis and liver metastasis. Continue current chemotherapy regimen. Continue f/u with palliative care as scheduled. Reports well-being and positive outlook with current treatment.

## 2024-02-12 ENCOUNTER — Other Ambulatory Visit: Payer: Self-pay | Admitting: Oncology

## 2024-02-12 NOTE — Progress Notes (Signed)
 Changed Zirabev 15mg /kg to Mvasi 15mg /kg as pharmacy does not carry Zirabev.  Change approved by Dr Babara. Olam pinal to check Prior auth for Mvasi but it is on the preferred list for [facility] so there should not be an issue.

## 2024-02-18 ENCOUNTER — Encounter: Payer: Self-pay | Admitting: Oncology

## 2024-02-18 ENCOUNTER — Telehealth: Payer: Self-pay | Admitting: *Deleted

## 2024-02-18 NOTE — Telephone Encounter (Signed)
 Patient had had surgery and this week she has been having some slight brown drainage and she has a small pad that she has been using throughout the day.  She says it is just a little trickle off and on.  I told her that I spoke with Dr. Babara as well as Tinnie Dawn who is the GYN specialist and she says that is definitely something that can happen with with the ovarian cancer.  She did say that if you get to where it is getting worse and having to fill a pad, then she should call over here because they might not to see your if that happens she says that she is fine with it she was just wanting to make her husband to say that this can happen with this kind of cancer as well as. From surgery.

## 2024-02-21 NOTE — Progress Notes (Signed)
 Discharge Plan Member has been discharged from [facility]. Please see below Discharge Plan for details. If you have any questions, please contact our Clinical Team at [phone].  Name: Beth Hart Date of Birth: 05/17/1950 Discharge Date: 2024-02-21 Discharged Reason: Has Declined Further Care Referring Provider: Fonda Mower Psychiatric Medication Recommendation Transition: N/A (no CC med recs made) Referrals: No referrals Discharge Summary: The Member completed an initial behavioral health evaluation. At the follow-up visit, the Member reported her treatment was going better and she no longer needed the additional support. [place] Care Provider: Kedric Severin - Health Coach

## 2024-02-24 ENCOUNTER — Ambulatory Visit
Admission: RE | Admit: 2024-02-24 | Discharge: 2024-02-24 | Disposition: A | Discharge status: Home / Self Care | Source: Ambulatory Visit

## 2024-02-24 DIAGNOSIS — Z1231 Encounter for screening mammogram for malignant neoplasm of breast: Secondary | ICD-10-CM | POA: Diagnosis not present

## 2024-02-25 ENCOUNTER — Encounter: Payer: Self-pay | Admitting: Oncology

## 2024-02-26 ENCOUNTER — Other Ambulatory Visit: Payer: Self-pay

## 2024-02-26 DIAGNOSIS — R928 Other abnormal and inconclusive findings on diagnostic imaging of breast: Secondary | ICD-10-CM

## 2024-02-27 ENCOUNTER — Inpatient Hospital Stay: Admitting: Oncology

## 2024-02-27 ENCOUNTER — Inpatient Hospital Stay

## 2024-02-27 ENCOUNTER — Encounter: Payer: Self-pay | Admitting: Oncology

## 2024-02-27 VITALS — BP 145/81 | HR 78

## 2024-02-27 VITALS — BP 120/66 | HR 91 | Temp 98.1°F | Resp 18 | Wt 163.1 lb

## 2024-02-27 DIAGNOSIS — C563 Malignant neoplasm of bilateral ovaries: Secondary | ICD-10-CM

## 2024-02-27 DIAGNOSIS — N1831 Chronic kidney disease, stage 3a: Secondary | ICD-10-CM

## 2024-02-27 DIAGNOSIS — C569 Malignant neoplasm of unspecified ovary: Secondary | ICD-10-CM | POA: Diagnosis not present

## 2024-02-27 DIAGNOSIS — C786 Secondary malignant neoplasm of retroperitoneum and peritoneum: Secondary | ICD-10-CM | POA: Diagnosis not present

## 2024-02-27 DIAGNOSIS — Z23 Encounter for immunization: Secondary | ICD-10-CM | POA: Diagnosis not present

## 2024-02-27 DIAGNOSIS — G620 Drug-induced polyneuropathy: Secondary | ICD-10-CM | POA: Diagnosis not present

## 2024-02-27 DIAGNOSIS — T451X5A Adverse effect of antineoplastic and immunosuppressive drugs, initial encounter: Secondary | ICD-10-CM | POA: Diagnosis not present

## 2024-02-27 DIAGNOSIS — Z5111 Encounter for antineoplastic chemotherapy: Secondary | ICD-10-CM | POA: Diagnosis not present

## 2024-02-27 DIAGNOSIS — Z79630 Long term (current) use of alkylating agent: Secondary | ICD-10-CM | POA: Diagnosis not present

## 2024-02-27 LAB — CMP (CANCER CENTER ONLY)
ALT: 27 U/L (ref 0–44)
AST: 30 U/L (ref 15–41)
Albumin: 3.8 g/dL (ref 3.5–5.0)
Alkaline Phosphatase: 99 U/L (ref 38–126)
Anion gap: 9 (ref 5–15)
BUN: 34 mg/dL — ABNORMAL HIGH (ref 8–23)
CO2: 22 mmol/L (ref 22–32)
Calcium: 9.3 mg/dL (ref 8.9–10.3)
Chloride: 103 mmol/L (ref 98–111)
Creatinine: 1.31 mg/dL — ABNORMAL HIGH (ref 0.44–1.00)
GFR, Estimated: 43 mL/min — ABNORMAL LOW (ref >60)
Glucose, Bld: 112 mg/dL — ABNORMAL HIGH (ref 70–99)
Potassium: 4.9 mmol/L (ref 3.5–5.1)
Sodium: 134 mmol/L — ABNORMAL LOW (ref 135–145)
Total Bilirubin: 0.6 mg/dL (ref 0.0–1.2)
Total Protein: 7.1 g/dL (ref 6.5–8.1)

## 2024-02-27 LAB — CBC WITH DIFFERENTIAL (CANCER CENTER ONLY)
Abs Immature Granulocytes: 0.06 K/uL (ref 0.00–0.07)
Basophils Absolute: 0.1 K/uL (ref 0.0–0.1)
Basophils Relative: 1 %
Eosinophils Absolute: 0.2 K/uL (ref 0.0–0.5)
Eosinophils Relative: 2 %
HCT: 35.8 % — ABNORMAL LOW (ref 36.0–46.0)
Hemoglobin: 11.4 g/dL — ABNORMAL LOW (ref 12.0–15.0)
Immature Granulocytes: 1 %
Lymphocytes Relative: 32 %
Lymphs Abs: 2.3 K/uL (ref 0.7–4.0)
MCH: 32.6 pg (ref 26.0–34.0)
MCHC: 31.8 g/dL (ref 30.0–36.0)
MCV: 102.3 fL — ABNORMAL HIGH (ref 80.0–100.0)
Monocytes Absolute: 0.7 K/uL (ref 0.1–1.0)
Monocytes Relative: 10 %
Neutro Abs: 4 K/uL (ref 1.7–7.7)
Neutrophils Relative %: 54 %
Platelet Count: 244 K/uL (ref 150–400)
RBC: 3.5 MIL/uL — ABNORMAL LOW (ref 3.87–5.11)
RDW: 13.4 % (ref 11.5–15.5)
WBC Count: 7.2 K/uL (ref 4.0–10.5)
nRBC: 0 % (ref 0.0–0.2)

## 2024-02-27 LAB — TOTAL PROTEIN, URINE DIPSTICK: Protein, ur: 30 mg/dL — AB

## 2024-02-27 MED ORDER — SODIUM CHLORIDE 0.9 % IV SOLN
15.0000 mg/kg | Freq: Once | INTRAVENOUS | Status: AC
  Administered 2024-02-27: 1100 mg via INTRAVENOUS
  Filled 2024-02-27: qty 32

## 2024-02-27 MED ORDER — SODIUM CHLORIDE 0.9 % IV SOLN
INTRAVENOUS | Status: DC
  Filled 2024-02-27: qty 250

## 2024-02-27 MED ORDER — SODIUM CHLORIDE 0.9 % IV SOLN
344.0000 mg | Freq: Once | INTRAVENOUS | Status: AC
  Administered 2024-02-27: 340 mg via INTRAVENOUS
  Filled 2024-02-27: qty 34

## 2024-02-27 MED ORDER — FAMOTIDINE IN NACL 20-0.9 MG/50ML-% IV SOLN
20.0000 mg | Freq: Once | INTRAVENOUS | Status: AC
  Administered 2024-02-27: 20 mg via INTRAVENOUS
  Filled 2024-02-27: qty 50

## 2024-02-27 MED ORDER — APREPITANT 130 MG/18ML IV EMUL
130.0000 mg | Freq: Once | INTRAVENOUS | Status: AC
  Administered 2024-02-27: 130 mg via INTRAVENOUS
  Filled 2024-02-27: qty 18

## 2024-02-27 MED ORDER — DIPHENHYDRAMINE HCL 50 MG/ML IJ SOLN
25.0000 mg | Freq: Once | INTRAMUSCULAR | Status: AC
  Administered 2024-02-27: 25 mg via INTRAVENOUS
  Filled 2024-02-27: qty 1

## 2024-02-27 MED ORDER — DEXAMETHASONE SOD PHOSPHATE PF 10 MG/ML IJ SOLN
10.0000 mg | Freq: Once | INTRAMUSCULAR | Status: AC
  Administered 2024-02-27: 10 mg via INTRAVENOUS

## 2024-02-27 MED ORDER — SODIUM CHLORIDE 0.9 % IV SOLN
135.0000 mg/m2 | Freq: Once | INTRAVENOUS | Status: AC
  Administered 2024-02-27: 240 mg via INTRAVENOUS
  Filled 2024-02-27: qty 40

## 2024-02-27 MED ORDER — PALONOSETRON HCL INJECTION 0.25 MG/5ML
0.2500 mg | Freq: Once | INTRAVENOUS | Status: AC
  Administered 2024-02-27: 0.25 mg via INTRAVENOUS
  Filled 2024-02-27: qty 5

## 2024-02-27 MED ORDER — SODIUM CHLORIDE 0.9 % IV SOLN: 15.0000 mg/kg | Freq: Once | INTRAVENOUS | Status: DC

## 2024-02-27 NOTE — Patient Instructions (Signed)

## 2024-02-27 NOTE — Progress Notes (Signed)
 Hematology/Oncology Progress note [phone] [phone]    CHIEF COMPLAINTS/REASON FOR VISIT:  High-grade serous carcinoma of ovaries. peritoneal carcinomatosis, liver lesion.   ASSESSMENT & PLAN:    Cancer Staging  Ovarian cancer [facility]) Staging form: Ovary, Fallopian Tube, and Primary Peritoneal Carcinoma, AJCC 8th Edition - Clinical stage from 09/25/2023: FIGO Stage IV (cT3c, cN1b, cM1) - Signed by Babara Call, MD on 10/05/2023   Ovarian cancer [facility]) Image findings and pathology results were reviewed and discussed with patient. 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  Normal CEA.  Elevated CA125 609, elevated CA 27.29 -415  S/p neoadjuvant chemotherapy with carboplatin /Taxol  x 4 Bevacizumab was not given in neoadjuvant setting due to tumor abutting bowel wall.  HRD positive  S/p  debulking surgery.  Recommend additional 3 cycles of chemotherapy. Labs are reviewed and discussed with patient. Proceed with cycle 6 Carboplatin  Taxol . Add Bevacizumab. Rationale and side effects were reviewed with patient.  She agrees.    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   CKD (chronic kidney disease) stage 3, GFR 30-59 ml/min (HCC) Encouraged oral hydration, avoid nephrotoxins.   Encounter for antineoplastic chemotherapy Chemotherapy treatments as listed above.   No orders of the defined types were placed in this encounter.  Follow-up 3 weeks.   All questions were answered. The patient knows to call the clinic with any problems, questions or concerns.  Call Babara, MD, PhD [facility] Hematology Oncology 02/27/2024   HISTORY OF PRESENTING ILLNESS:   Beth Hart is a  73 y.o.  female with PMH listed below presents for follow up of stage IV ovarian high grade serous carcinoma.  Oncology History  Peritoneal carcinomatosis (HCC)  09/18/2023 Initial  Diagnosis   Peritoneal carcinomatosis (HCC)   10/10/2023 -  Chemotherapy   Patient is on Treatment Plan : OVARIAN Carboplatin   + Paclitaxel  ) q21d      Genetic Testing   Negative genetic testing. No pathogenic variants identified on the [facility] CancerNext+RNA panel. The report date is 10/20/2023.  The [facility] CancerNext+RNAinsight Panel includes sequencing, rearrangement analysis, and RNA analysis for the following 40 genes: APC, ATM, BAP1, BARD1, BMPR1A, BRCA1, BRCA2, BRIP1, CDH1, CDKN2A, CHEK2, FH, FLCN, MET, MLH1, MSH2, MSH6, MUTYH, NF1, NTHL1, PALB2, PMS2, PTEN, RAD51C, RAD51D, RPS20, SMAD4, STK11, TP53, TSC1, TSC2, and VHL (sequencing and deletion/duplication); AXIN2, HOXB13, MBD4, MSH3, POLD1 and POLE (sequencing only); EPCAM and GREM1 (deletion/duplication only).   Ovarian cancer (HCC)  09/25/2023 Cancer Staging   Staging form: Ovary, Fallopian Tube, and Primary Peritoneal Carcinoma, AJCC 8th Edition - Clinical stage from 09/25/2023: FIGO Stage IV (cT3c, cN1b, cM1) - Signed by Babara Call, MD on 10/05/2023 Stage prefix: Initial diagnosis   10/04/2023 Initial Diagnosis   Ovarian cancer  She experiences pressure and sharp pain in her abdomen, initially attributing it to a pulled muscle from yard work. The pain persisted and was accompanied by a fever, prompting her to visit the ER on 09/16/2023. The fever was around 100F at home and remained at that level upon arrival at the ER. She has a persistent cough, especially when lying down.   09/16/2023, CT abdomen pelvis with contrast showed extensive omental and peritoneal carcinomatosis with small to moderate ascites. multiple hypoattenuating masses in the liver and spleen, compatible with metastases. There are also multiple mildly enlarged retroperitoneal lymph nodes, also highly concerning for metastases.  09/24/2023, cytology from peritoneal ascites showed negative for malignancy. 09/25/2023, endometrial biopsy showed no  malignancy. 09/26/2023 Patient  underwent peritoneal mass biopsy,  1. Peritoneum, biopsy, RUQ mass :       - METASTATIC HIGH-GRADE SEROUS CARCINOMA WITH NECROSIS, SEE NOTE   Diagnosis Note : Immunohistochemical stains show that the tumor cells are positive for PAX8, p16 and p53 (clonal overexpression pattern) while they are negative for p63 and CK5/6.  This immunoprofile is consistent with above interpretation.    Tumor Testing: [facility] shows HRD but no somatic BRCA mutation Genomic instability  score of 43.   10/10/2023 -  Chemotherapy   Patient is on Treatment Plan : OVARIAN Carboplatin   + Paclitaxel  ) q21d      Imaging   CT chest w contrast  Several scattered sub 5 mm bilateral lung nodules identified. Based on appearance and other findings these are worrisome for early lung metastases. Few enlarged pre cardiophrenic mediastinal nodes as well as left internal mammary chain lymph nodes also worrisome for spread of disease.Tiny left pleural effusion.   Please correlate with previous abdomen pelvis CT describing abdominal findings.      Genetic Testing   Negative genetic testing. No pathogenic variants identified on the [facility] CancerNext+RNA panel. The report date is 10/20/2023.  The [facility] CancerNext+RNAinsight Panel includes sequencing, rearrangement analysis, and RNA analysis for the following 40 genes: APC, ATM, BAP1, BARD1, BMPR1A, BRCA1, BRCA2, BRIP1, CDH1, CDKN2A, CHEK2, FH, FLCN, MET, MLH1, MSH2, MSH6, MUTYH, NF1, NTHL1, PALB2, PMS2, PTEN, RAD51C, RAD51D, RPS20, SMAD4, STK11, TP53, TSC1, TSC2, and VHL (sequencing and deletion/duplication); AXIN2, HOXB13, MBD4, MSH3, POLD1 and POLE (sequencing only); EPCAM and GREM1 (deletion/duplication only).   01/07/2024 Surgery   Debulking surgery  Pathology showed  A. Omentum, omentectomy:   High-grade serous carcinoma (7.5 cm).     B. Left fallopian tube and ovary, left salpingo-oophorectomy:   High-grade serous carcinoma with surface involvement of the  ovary. Benign fallopian tube.     C. Right fallopian tube and ovary, right salpingo-oophorectomy:   High-grade serous carcinoma with surface involvement of the ovary and fallopian tube.     D. Uterus and cervix, hysterectomy:   Uterus (39 grams): Endometrium: Inactive. Myometrium: Benign.  Cervix: Benign. Serosa: High-grade serous carcinoma.  TUMOR    Tumor Site:    Bilateral ovaries     Tumor Size:    Greatest Dimension (Centimeters): 1.7 cm     Histologic Type:    High-grade serous carcinoma     Ovarian Surface Involvement:    Present, right and left     Fallopian Tube Surface Involvement:    Present, right     Other Tissue / Organ Involvement:    Right ovary     Other Tissue / Organ Involvement:    Left ovary     Other Tissue / Organ Involvement:    Right fallopian tube     Other Tissue / Organ Involvement:    Omentum     Other Tissue / Organ Involvement:    uterine serosa     Largest Extrapelvic Peritoneal Focus:    Macroscopic (greater than 2 cm): Omentum     Peritoneal / Ascitic Fluid Involvement:    Not submitted / unknown     Chemotherapy Response Score (CRS):    CRS1 (no definite or minimal response)   REGIONAL LYMPH NODES     Regional Lymph Node Status:    Not applicable (no regional lymph nodes submitted or found)   pTNM CLASSIFICATION (AJCC 8th Edition)     Reporting of pT, pN, and (when  applicable) pM categories is based on information available to the pathologist at the time the report is issued. As per the AJCC (Chapter 1, 8th Ed.) it is the managing physician's responsibility to establish the final pathologic stage based upon all pertinent information, including but potentially not limited to this pathology report.     Modified Classification:    y     pT Category:    pT3c     pN Category:    pN not assigned (no nodes submitted or found)       Her family history includes father who had lung cancer attributed to smoking, but no history of colon, ovarian, or  breast cancer.  She underwent a colonoscopy a few years ago, which was reported to be negative.  And a mammogram six months ago, which was negative she does not recall any recent stomach pain, dysphagia, or acid reflux.  INTERVAL HISTORY Beth Hart is a 73 y.o. female who has above history reviewed by me today presents for follow up visit for Stage IV ovarian cancer.  She s/p debulking surgery.  Surgical wound is healing well. She reports feeling well. No abdominal pain.     MEDICAL HISTORY:  Past Medical History:  Diagnosis Date   Anemia    Arthritis    Asthma    as a child   Cataract 2019   Chicken pox    Chronic kidney disease 2015   Colon polyps 2012   Depression    Hemorrhoids    Ovarian cancer (HCC)     SURGICAL HISTORY: Past Surgical History:  Procedure Laterality Date   ABDOMINAL HYSTERECTOMY     BREAST CYST ASPIRATION Left 90s   BREAST SURGERY  2007   aspiration   CARPAL TUNNEL RELEASE  2010   REFRACTIVE SURGERY     TONSILLECTOMY      SOCIAL HISTORY: Social History   Socioeconomic History   Marital status: Married    Spouse name: Not on file   Number of children: Not on file   Years of education: Not on file   Highest education level: Associate degree: occupational, scientist, product/process development, or vocational program  Occupational History   Not on file  Tobacco Use   Smoking status: Never   Smokeless tobacco: Never  Vaping Use   Vaping status: Never Used  Substance and Sexual Activity   Alcohol use: No   Drug use: No   Sexual activity: Yes  Other Topics Concern   Not on file  Social History Narrative   married   Social Drivers of Corporate Investment Banker Strain: Low Risk  (02/07/2024)   Overall Financial Resource Strain (CARDIA)    Difficulty of Paying Living Expenses: Not hard at all  Food Insecurity: No Food Insecurity (02/07/2024)   Hunger Vital Sign    Worried About Running Out of Food in the Last Year: Never true    Ran Out of Food in the  Last Year: Never true  Transportation Needs: No Transportation Needs (02/07/2024)   PRAPARE - Administrator, Civil Service (Medical): No    Lack of Transportation (Non-Medical): No  Physical Activity: Inactive (02/07/2024)   Exercise Vital Sign    Days of Exercise per Week: 0 days    Minutes of Exercise per Session: Not on file  Stress: No Stress Concern Present (02/07/2024)   [employer] of Occupational Health - Occupational Stress Questionnaire    Feeling of Stress: Not at all  Social Connections: Moderately  Integrated (02/07/2024)   Social Connection and Isolation Panel    Frequency of Communication with Friends and Family: Once a week    Frequency of Social Gatherings with Friends and Family: Once a week    Attends Religious Services: More than 4 times per year    Active Member of [employer] or Organizations: Yes    Attends Engineer, Structural: More than 4 times per year    Marital Status: Married  Catering Manager Violence: Not At Risk (09/25/2023)   Humiliation, Afraid, Rape, and Kick questionnaire    Fear of Current or Ex-Partner: No    Emotionally Abused: No    Physically Abused: No    Sexually Abused: No    FAMILY HISTORY: Family History  Problem Relation Age of Onset   Alzheimer's disease Mother    Depression Mother    Vision loss Mother    Lung cancer Father    Cancer Father    Prostate cancer Maternal Uncle    Prostate cancer Maternal Uncle    Prostate cancer Maternal Uncle    Prostate cancer Maternal Uncle    Prostate cancer Maternal Uncle    Prostate cancer Other        in mat great uncles, unk#   Prostate cancer Cousin        in 4-5 mat  first cousins   Breast cancer Neg Hx     ALLERGIES:  has no known allergies.  MEDICATIONS:  Current Outpatient Medications  Medication Sig Dispense Refill   citalopram  (CELEXA ) 40 MG tablet Take 1 tablet (40 mg total) by mouth daily. 30 tablet 3   dexamethasone  (DECADRON ) 4 MG tablet Take 2  tablets (8mg ) by mouth daily starting the day after carboplatin  for 3 days. Take with food 30 tablet 1   ondansetron  (ZOFRAN ) 8 MG tablet Take 1 tablet (8 mg total) by mouth every 8 (eight) hours as needed for nausea or vomiting. Start on the third day after carboplatin . 30 tablet 1   oxyCODONE  (OXY IR/ROXICODONE ) 5 MG immediate release tablet Take 1 tablet (5 mg total) by mouth every 8 (eight) hours as needed for severe pain (pain score 7-10). 30 tablet 0   polyethylene glycol powder (GLYCOLAX/MIRALAX) 17 GM/SCOOP powder Take by mouth once.     prochlorperazine  (COMPAZINE ) 10 MG tablet Take 1 tablet (10 mg total) by mouth every 6 (six) hours as needed for nausea or vomiting. 30 tablet 1   senna (SENOKOT) 8.6 MG TABS tablet Take 1 tablet (8.6 mg total) by mouth daily as needed for mild constipation. 120 tablet 0   No current facility-administered medications for this visit.   Facility-Administered Medications Ordered in Other Visits  Medication Dose Route Frequency Provider Last Rate Last Admin   0.9 %  sodium chloride  infusion   Intravenous Continuous Babara Call, MD 10 mL/hr at 02/27/24 0938 New Bag at 02/27/24 0938   bevacizumab-bvzr (ZIRABEV) 1,100 mg in sodium chloride  0.9 % 100 mL chemo infusion  15 mg/kg (Treatment Plan Recorded) Intravenous Once Nanami Whitelaw, MD       CARBOplatin  (PARAPLATIN ) 340 mg in sodium chloride  0.9 % 100 mL chemo infusion  340 mg Intravenous Once Babara Call, MD       heparin  lock flush 100 unit/mL  500 Units Intravenous Once Babara Call, MD       PACLitaxel  (TAXOL ) 240 mg in sodium chloride  0.9 % 250 mL chemo infusion (> 80mg /m2)  135 mg/m2 (Treatment Plan Recorded) Intravenous Once Babara Call, MD [phone]  mL/hr at 02/27/24 1055 240 mg at 02/27/24 1055    Review of Systems  Constitutional:  Positive for fatigue. Negative for appetite change, chills, fever and unexpected weight change.  HENT:   Negative for hearing loss and voice change.   Eyes:  Negative for eye problems.   Respiratory:  Negative for chest tightness and cough.   Cardiovascular:  Negative for chest pain.  Gastrointestinal:  Negative for abdominal distention, abdominal pain, blood in stool, nausea and vomiting.  Endocrine: Negative for hot flashes.  Genitourinary:  Negative for difficulty urinating and frequency.   Musculoskeletal:  Negative for arthralgias.  Skin:  Negative for itching and rash.  Neurological:  Negative for extremity weakness.  Hematological:  Negative for adenopathy.  Psychiatric/Behavioral:  Positive for depression and sleep disturbance. Negative for confusion.    PHYSICAL EXAMINATION: ECOG PERFORMANCE STATUS: 1 - Symptomatic but completely ambulatory Vitals:   02/27/24 0842  BP: 120/66  Pulse: 91  Resp: 18  Temp: 98.1 F (36.7 C)  SpO2: 97%    Filed Weights   02/27/24 0842  Weight: 163 lb 1.6 oz (74 kg)     Physical Exam Constitutional:      General: She is not in acute distress. HENT:     Head: Normocephalic and atraumatic.  Eyes:     General: No scleral icterus. Cardiovascular:     Rate and Rhythm: Normal rate and regular rhythm.     Heart sounds: Normal heart sounds.  Pulmonary:     Effort: Pulmonary effort is normal. No respiratory distress.     Breath sounds: No wheezing.  Abdominal:     General: Bowel sounds are normal. There is no distension.     Palpations: Abdomen is soft.  Musculoskeletal:        General: No deformity. Normal range of motion.     Cervical back: Normal range of motion and neck supple.  Skin:    General: Skin is warm and dry.     Findings: No erythema or rash.  Neurological:     Mental Status: She is alert and oriented to person, place, and time. Mental status is at baseline.  Psychiatric:        Mood and Affect: Mood normal.     LABORATORY DATA:  I have reviewed the data as listed    Latest Ref Rng & Units 02/27/2024    8:20 AM 02/06/2024    8:07 AM 01/03/2024    9:18 AM  CBC  WBC 4.0 - 10.5 K/uL 7.2  6.4  3.9    Hemoglobin 12.0 - 15.0 g/dL 88.5  88.9  89.2   Hematocrit 36.0 - 46.0 % 35.8  34.4  33.3   Platelets 150 - 400 K/uL 244  233  196       Latest Ref Rng & Units 02/27/2024    8:20 AM 02/06/2024    8:07 AM 12/12/2023    8:19 AM  CMP  Glucose 70 - 99 mg/dL 887  891  887   BUN 8 - 23 mg/dL 34  28  36   Creatinine 0.44 - 1.00 mg/dL 8.68  8.78  8.77   Sodium 135 - 145 mmol/L 134  136  136   Potassium 3.5 - 5.1 mmol/L 4.9  4.4  4.6   Chloride 98 - 111 mmol/L 103  105  105   CO2 22 - 32 mmol/L 22  23  23    Calcium 8.9 - 10.3 mg/dL 9.3  9.3  9.4  Total Protein 6.5 - 8.1 g/dL 7.1  7.1  6.7   Total Bilirubin 0.0 - 1.2 mg/dL 0.6  0.5  0.5   Alkaline Phos 38 - 126 U/L 99  103  70   AST 15 - 41 U/L 30  26  26    ALT 0 - 44 U/L 27  22  22        RADIOGRAPHIC STUDIES: I have personally reviewed the radiological images as listed and agreed with the findings in the report. MM 3D SCREENING MAMMOGRAM BILATERAL BREAST Result Date: 02/26/2024 CLINICAL DATA:  Screening. EXAM: DIGITAL SCREENING BILATERAL MAMMOGRAM WITH TOMOSYNTHESIS AND CAD TECHNIQUE: Bilateral screening digital craniocaudal and mediolateral oblique mammograms were obtained. Bilateral screening digital breast tomosynthesis was performed. The images were evaluated with computer-aided detection. COMPARISON:  Previous exam(s). ACR Breast Density Category c: The breasts are heterogeneously dense, which may obscure small masses. FINDINGS: In the right breast, calcifications about the slightly upper slightly inner breast, middle to posterior third, warrant further evaluation with magnified views. In the left breast, no findings suspicious for malignancy. IMPRESSION: Further evaluation is suggested for calcifications in the right breast. RECOMMENDATION: Diagnostic mammogram of the right breast. (Code:[id number]) The patient will be contacted regarding the findings, and additional imaging will be scheduled. BI-RADS CATEGORY  0: Incomplete: Need  additional imaging evaluation. Electronically Signed   By: Curtistine Noble   On: 02/26/2024 14:34   CT CHEST ABDOMEN PELVIS W CONTRAST Result Date: 12/11/2023 EXAM: CT CHEST, ABDOMEN AND PELVIS WITH CONTRAST 12/02/2023 02:33:35 PM TECHNIQUE: CT of the chest, abdomen and pelvis was performed with the administration of intravenous contrast. Multiplanar reformatted images are provided for review. Automated exposure control, iterative reconstruction, and/or weight based adjustment of the mA/kV was utilized to reduce the radiation dose to as low as reasonably achievable. COMPARISON: CT chest from 10/03/2023 and CT abdomen and pelvis from 09/16/2023. CLINICAL HISTORY: Ovarian cancer. 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 FINDINGS: CHEST: MEDIASTINUM: Heart and pericardium are unremarkable. The central airways are clear. THORACIC LYMPH NODES: No mediastinal, hilar or axillary lymphadenopathy. LUNGS AND PLEURA: Right lower lobe lung nodule measures 3 mm, image 64/4, previously 5 mm. The previous 4 mm right lower lobe lung nodule has resolved in the interval. Previous 4 mm nodule within the lower right lower lobe measures 2 mm, image 92/4, previously 4 mm. Calcified granuloma identified within the left upper lobe. No pleural effusion or pneumothorax. ABDOMEN AND PELVIS: LIVER: Unchanged cyst along the dome of left lobe of liver measuring 2.3 cm, image 46/2. Inferior right lobe of liver metastasis measures 3.5 x 2.2 cm, image 59/2, previously 4.5 x 3.5 cm. Index lesion within segment 5 measures 1.3 x  0.9 cm, image 59/2, previously 1.6 x 1.1 cm. No new liver lesions. GALLBLADDER AND BILE DUCTS: Gallstones are again noted. No biliary ductal dilatation. Common bile duct is upper limits of normal measuring 6 mm, similar to previous exam. SPLEEN: Multiple splenic metastases. Index lesion within the posterior lateral spleen measures 2.4 x 1.6 cm, image 55/2, previously 3.2 x 2.0 cm. Inferior splenic lesion measures 3.1 x 2.5 cm, previously this measured the same. PANCREAS: No  pancreatic inflammation, main duct dilatation or mass. ADRENAL GLANDS: Normal adrenal glands. KIDNEYS, URETERS AND BLADDER: Bilateral renal cortical scarring and volume loss noted. No nephrolithiasis, hydronephrosis or suspicious mass. No bladder abnormality. GI AND BOWEL: No signs of bowel obstruction. Moderate stool burden noted throughout the colon. PERITONEUM AND RETROPERITONEUM: Peritoneal metastasis again noted. Index lesion within the left hemiabdomen along the undersurface of the ventral abdominal wall measures 2.6 x 1.6 cm, image 75/2, on the previous exam this measured 4.3 x 2.6 cm. At a slightly higher level peritoneal nodule measures 2.2 x 1.8 cm, image 70/2, previously 3.1 x 2.6 cm. Soft tissue nodularity along the undersurface of both hemidiaphragms is also improved in the interval. Left upper quadrant peritoneal nodule measures 2.5 x 1.8 cm, image 63/2, on the previous exam is measured 3.3 x 2.4 cm. Interval resolution of previously noted ascites. VASCULATURE: Aorta is normal in caliber. ABDOMINAL AND PELVIS LYMPH NODES: Abdominal pelvic adenopathy is again noted. Index aortocaval lymph node measures 1 cm short axis, image 77/2, previously 1.1 cm. Index lymph node left periaortic lymph node measures 1.4 cm, image 60/2, previously 1.5 cm. Left common iliac lymph node measures 0.9 cm, image 84/2, previously 1.2 cm. Right external iliac node measures 0.9 cm, image 101/2, previously 1 cm. REPRODUCTIVE ORGANS: No focal abnormality  involving the uterus. BONES AND SOFT TISSUES: No acute or suspicious osseous findings. IMPRESSION: 1. Interval improvement in peritoneal metastases, splenic metastases, and liver metastases. 2. Interval resolution of previously noted ascites. 3. Abdominal pelvic adenopathy, improved in the interval. 4. Interval improvement in right lower lobe lung nodules. Electronically signed by: Waddell Calk MD 12/11/2023 09:22 AM EDT RP Workstation: [id number]

## 2024-02-27 NOTE — Assessment & Plan Note (Addendum)
 Image findings and pathology results were reviewed and discussed with patient. 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  Normal CEA.  Elevated CA125 609, elevated CA 27.29 -415  S/p neoadjuvant chemotherapy with carboplatin /Taxol  x 4 Bevacizumab was not given in neoadjuvant setting due to tumor abutting bowel wall.  HRD positive  S/p  debulking surgery.  Recommend additional 3 cycles of chemotherapy. Labs are reviewed and discussed with patient. Proceed with cycle 6 Carboplatin  Taxol . Add Bevacizumab. Rationale and side effects were reviewed with patient.  She agrees.

## 2024-02-27 NOTE — Assessment & Plan Note (Signed)
 Grade 1/2  Monitor.

## 2024-02-27 NOTE — Assessment & Plan Note (Signed)
 Chemotherapy treatments as listed above.

## 2024-02-27 NOTE — Assessment & Plan Note (Signed)
 Encouraged oral hydration, avoid nephrotoxins.

## 2024-02-28 ENCOUNTER — Ambulatory Visit
Admission: RE | Admit: 2024-02-28 | Discharge: 2024-02-28 | Disposition: A | Discharge status: Home / Self Care | Source: Ambulatory Visit

## 2024-02-28 ENCOUNTER — Encounter: Payer: Self-pay | Admitting: Oncology

## 2024-02-28 DIAGNOSIS — R928 Other abnormal and inconclusive findings on diagnostic imaging of breast: Secondary | ICD-10-CM | POA: Diagnosis not present

## 2024-02-28 DIAGNOSIS — R92333 Mammographic heterogeneous density, bilateral breasts: Secondary | ICD-10-CM | POA: Diagnosis not present

## 2024-02-28 DIAGNOSIS — R921 Mammographic calcification found on diagnostic imaging of breast: Secondary | ICD-10-CM

## 2024-02-28 LAB — CA 125: Cancer Antigen (CA) 125: 96.1 U/mL — ABNORMAL HIGH (ref 0.0–38.1)

## 2024-03-02 ENCOUNTER — Emergency Department

## 2024-03-02 ENCOUNTER — Other Ambulatory Visit: Payer: Self-pay | Admitting: Oncology

## 2024-03-02 ENCOUNTER — Other Ambulatory Visit: Payer: Self-pay

## 2024-03-02 ENCOUNTER — Encounter: Payer: Self-pay | Admitting: Internal Medicine

## 2024-03-02 ENCOUNTER — Emergency Department
Admission: EM | Admit: 2024-03-02 | Discharge: 2024-03-02 | Disposition: A | Discharge status: Home / Self Care | Attending: Emergency Medicine | Admitting: Emergency Medicine

## 2024-03-02 DIAGNOSIS — K5900 Constipation, unspecified: Secondary | ICD-10-CM | POA: Diagnosis not present

## 2024-03-02 DIAGNOSIS — K5903 Drug induced constipation: Secondary | ICD-10-CM | POA: Insufficient documentation

## 2024-03-02 DIAGNOSIS — Z8543 Personal history of malignant neoplasm of ovary: Secondary | ICD-10-CM | POA: Diagnosis not present

## 2024-03-02 DIAGNOSIS — N183 Chronic kidney disease, stage 3 unspecified: Secondary | ICD-10-CM | POA: Insufficient documentation

## 2024-03-02 MED ORDER — POLYETHYLENE GLYCOL 3350 17 G PO PACK
17.0000 g | PACK | Freq: Every day | ORAL | Status: DC
  Administered 2024-03-02: 17 g via ORAL
  Filled 2024-03-02: qty 1

## 2024-03-02 MED ORDER — SODIUM CHLORIDE 0.9 % IV BOLUS
1000.0000 mL | Freq: Once | INTRAVENOUS | Status: AC
  Administered 2024-03-02: 1000 mL via INTRAVENOUS

## 2024-03-02 NOTE — Discharge Instructions (Signed)
 You have been diagnosed with constipation induced by drug.  Dehydration.  Please take MiraLAX 3 caps in a glass of water 3 times per day until you can pass stools.  After you passed the stools please continue taking MiraLAX 1 capful in a glass of water once a day.  Keep taking oliva oil.  Keep taking Colace.  Please come back to ED or go to your PCP if you have new symptoms or symptoms worsen.  It was a pleasure to help you today.  Keontay Vora, PA-C

## 2024-03-02 NOTE — Progress Notes (Signed)
 Patient called regarding worsening constipation-including abdominal pain for the last 2 days.  As patient is quite miserable, I recommend go to emergency room for further evaluation-and disimpaction.   GB

## 2024-03-02 NOTE — ED Triage Notes (Signed)
 Pt reports she began a new chemo treatment this past Thursday, pt reports being constipated for the past 3-4 days.

## 2024-03-02 NOTE — ED Provider Notes (Signed)
 [facility] Provider Note    Event Date/Time   First MD Initiated Contact with Patient 03/02/24 2047     (approximate)   History   Constipation    HPI  Beth Hart is a 73 y.o. female    with a past medical history of chemotherapy, malignant neoplasm of both ovaries, peritoneal carcinomatosis, chemotherapy, who presents to the ED complaining of the patient. According to the patient, last chemotherapy was 4 days ago, and they added a new medication  producing constipation.  Last bowel movement was 3 days ago.  Patient is not drinking water due to change in her taste after chemotherapy.  Patient tried manual removal of the stools without success.  Patient took only for a while, MiraLAX without resolution of the symptoms.  Patient feels uncomfortable.  Patient denies  vomiting, no abdominal pain, no fever, no shortness of breath, no wheezing, no calf pain.  Patient is here with her husband    Patient Active Problem List   Diagnosis Date Noted   Need for prophylactic vaccination and inoculation against influenza 01/29/2024   Genetic testing 10/21/2023   Weight loss 10/17/2023   Chemotherapy-induced neuropathy 10/17/2023   IDA (iron  deficiency anemia) 10/10/2023   Encounter for antineoplastic chemotherapy 10/10/2023   Symptomatic anemia 10/10/2023   Ovarian cancer (HCC) 10/04/2023   Liver lesion 09/18/2023   Peritoneal carcinomatosis (HCC) 09/18/2023   Iron  deficiency anemia 09/18/2023   Positive self-administered antigen test for COVID-19 01/16/2023   Routine general medical examination at a health care facility 08/17/2022   Hyperparathyroidism due to renal insufficiency 08/16/2022   Prediabetes 08/15/2021   Obesity (BMI 30.0-34.9) 07/25/2020   Proteinuria 06/25/2019   Depression 11/23/2015   CKD (chronic kidney disease) stage 3, GFR 30-59 ml/min (HCC) 11/23/2015   Hyperlipidemia 11/23/2015    Physical Exam   Triage Vital Signs: ED Triage  Vitals  Encounter Vitals Group     BP 03/02/24 2033 (!) 171/96     Girls Systolic BP Percentile --      Girls Diastolic BP Percentile --      Boys Systolic BP Percentile --      Boys Diastolic BP Percentile --      Pulse Rate 03/02/24 2033 86     Resp 03/02/24 2033 17     Temp 03/02/24 2033 97.6 F (36.4 C)     Temp Source 03/02/24 2033 Oral     SpO2 03/02/24 2033 97 %     Weight --      Height --      Head Circumference --      Peak Flow --      Pain Score 03/02/24 2034 6     Pain Loc --      Pain Education --      Exclude from Growth Chart --     Most recent vital signs: Vitals:   03/02/24 2033  BP: (!) 171/96  Pulse: 86  Resp: 17  Temp: 97.6 F (36.4 C)  SpO2: 97%     Physical Exam Vitals and nursing note reviewed.  During triage patient was hypertensive  General:          Awake, no distress.  Mouth: Mucosal is dry CV:                  Good peripheral perfusion.  Resp:               Normal effort. no tachypnea, no wheezing Abd:  Bowel sounds positive, scar in the midline from surgery, mild distention.  Soft nontender to palpation. Other:   Lower extremities nontender to palpation            ED Results / Procedures / Treatments   Labs (all labs ordered are listed, but only abnormal results are displayed) Labs Reviewed - No data to display    RADIOLOGY I independently reviewed and interpreted imaging and agree with radiologists findings.      PROCEDURES:  Critical Care performed:   Procedures   MEDICATIONS ORDERED IN ED: Medications  polyethylene glycol (MIRALAX / GLYCOLAX) packet 17 g (17 g Oral Given 03/02/24 2147)  sodium chloride  0.9 % bolus 1,000 mL (0 mLs Intravenous Stopped 03/02/24 2229)   Clinical Course as of 03/02/24 2257  Mon Mar 02, 2024  2115 DG Abdomen 1 View  Large stool burden throughout the colon. [AE]  2251 Reassessed the patient after having IV fluids.  Patient endorses feeling better.  Patient is going to be  discharged to continue taking MiraLAX 3 caps in a glass of water until she can pass stools.  Patient is agreeable with the plan [AE]    Clinical Course User Index [AE] Janit Kast, PA-C    IMPRESSION / MDM / ASSESSMENT AND PLAN / ED COURSE  I reviewed the triage vital signs and the nursing notes.  Differential diagnosis includes, but is not limited to, obstipation, bowel obstruction, likely mesenteric infarct  Patient's presentation is most consistent with acute complicated illness / injury requiring diagnostic workup.   Beth Hart is a 73 y.o., female who presents today with history of 3 days of constipation after changes on her chemotherapy regimen 4 days ago.  On a physical exam, mucosa is dry.  Cardiopulmonary is clear, abdomen bowel sounds positive, presence of old scar from surgery.  Mild distention.  Abdomen is soft and nontender to palpation.  Legs no tenderness to palpation.  Plan IV fluids MiraLAX Reassess Reassessed the patient after getting IV fluids, patient feels better. Patient's diagnosis is consistent with constipation, dehydration. I independently reviewed and interpreted imaging and agree with radiologists findings.  I did not order any labs. I did review the patient's allergies and medications.The patient is in stable and satisfactory condition for discharge home  Patient will be discharged home without prescriptions. Patient is to follow up with PCP as needed or otherwise directed.  I did advise patient to drink plenty of fluids, continue taking MiraLAX 3 caps in a glass of water, continue taking only oral and sena. Patient is given ED precautions to return to the ED for any worsening or new symptoms. Discussed plan of care with patient, answered all of patient's questions, Patient agreeable to plan of care. Advised patient to take medications according to the instructions on the label. Discussed possible side effects of new medications. Patient verbalized  understanding.  FINAL CLINICAL IMPRESSION(S) / ED DIAGNOSES   Final diagnoses:  Drug-induced constipation     Rx / DC Orders   ED Discharge Orders     None        Note:  This document was prepared using Dragon voice recognition software and may include unintentional dictation errors.   Janit Kast, PA-C 03/02/24 2257    Willo Dunnings, MD 03/02/24 2325

## 2024-03-03 ENCOUNTER — Encounter: Payer: Self-pay | Admitting: Oncology

## 2024-03-03 ENCOUNTER — Ambulatory Visit: Payer: Self-pay

## 2024-03-03 ENCOUNTER — Other Ambulatory Visit: Payer: Self-pay

## 2024-03-03 MED FILL — Lactulose Solution 10 GM/15ML: ORAL | 2 days supply | Qty: 236 | Fill #0 | Status: AC

## 2024-03-03 NOTE — Progress Notes (Signed)
 Noted.  Jacklin Mascot, MD

## 2024-03-04 DIAGNOSIS — N2581 Secondary hyperparathyroidism of renal origin: Secondary | ICD-10-CM | POA: Diagnosis not present

## 2024-03-04 DIAGNOSIS — R8290 Unspecified abnormal findings in urine: Secondary | ICD-10-CM | POA: Diagnosis not present

## 2024-03-04 DIAGNOSIS — N1832 Chronic kidney disease, stage 3b: Secondary | ICD-10-CM | POA: Diagnosis not present

## 2024-03-04 DIAGNOSIS — D631 Anemia in chronic kidney disease: Secondary | ICD-10-CM | POA: Diagnosis not present

## 2024-03-09 ENCOUNTER — Ambulatory Visit
Admission: RE | Admit: 2024-03-09 | Discharge: 2024-03-09 | Disposition: A | Discharge status: Home / Self Care | Source: Ambulatory Visit

## 2024-03-09 ENCOUNTER — Ambulatory Visit: Admission: RE | Admit: 2024-03-09 | Discharge: 2024-03-09 | Disposition: A | Source: Ambulatory Visit

## 2024-03-09 DIAGNOSIS — R928 Other abnormal and inconclusive findings on diagnostic imaging of breast: Secondary | ICD-10-CM | POA: Diagnosis not present

## 2024-03-09 DIAGNOSIS — R921 Mammographic calcification found on diagnostic imaging of breast: Secondary | ICD-10-CM | POA: Diagnosis not present

## 2024-03-09 DIAGNOSIS — N6011 Diffuse cystic mastopathy of right breast: Secondary | ICD-10-CM | POA: Diagnosis not present

## 2024-03-09 HISTORY — PX: BREAST BIOPSY: SHX20

## 2024-03-09 MED ORDER — LIDOCAINE-EPINEPHRINE 1 %-1:100000 IJ SOLN
20.0000 mL | Freq: Once | INTRAMUSCULAR | Status: AC
  Administered 2024-03-09: 20 mL
  Filled 2024-03-09: qty 20

## 2024-03-09 MED ORDER — LIDOCAINE 1 % OPTIME INJ - NO CHARGE
5.0000 mL | Freq: Once | INTRAMUSCULAR | Status: AC
  Administered 2024-03-09: 5 mL
  Filled 2024-03-09: qty 6

## 2024-03-09 NOTE — Progress Notes (Signed)
 Noted.  Beth Mascot, MD

## 2024-03-10 ENCOUNTER — Encounter: Payer: Self-pay | Admitting: Oncology

## 2024-03-10 LAB — SURGICAL PATHOLOGY

## 2024-03-19 ENCOUNTER — Other Ambulatory Visit: Payer: Self-pay

## 2024-03-19 ENCOUNTER — Encounter: Payer: Self-pay | Admitting: Oncology

## 2024-03-19 ENCOUNTER — Inpatient Hospital Stay: Attending: Oncology

## 2024-03-19 ENCOUNTER — Inpatient Hospital Stay

## 2024-03-19 ENCOUNTER — Inpatient Hospital Stay: Admitting: Oncology

## 2024-03-19 VITALS — BP 174/94 | HR 83

## 2024-03-19 VITALS — BP 123/78 | HR 77 | Temp 98.3°F | Resp 18 | Wt 163.7 lb

## 2024-03-19 DIAGNOSIS — G620 Drug-induced polyneuropathy: Secondary | ICD-10-CM | POA: Diagnosis not present

## 2024-03-19 DIAGNOSIS — R809 Proteinuria, unspecified: Secondary | ICD-10-CM | POA: Diagnosis not present

## 2024-03-19 DIAGNOSIS — Z5111 Encounter for antineoplastic chemotherapy: Secondary | ICD-10-CM | POA: Diagnosis not present

## 2024-03-19 DIAGNOSIS — C563 Malignant neoplasm of bilateral ovaries: Secondary | ICD-10-CM | POA: Diagnosis not present

## 2024-03-19 DIAGNOSIS — Z79633 Long term (current) use of mitotic inhibitor: Secondary | ICD-10-CM | POA: Insufficient documentation

## 2024-03-19 DIAGNOSIS — K5903 Drug induced constipation: Secondary | ICD-10-CM

## 2024-03-19 DIAGNOSIS — N1831 Chronic kidney disease, stage 3a: Secondary | ICD-10-CM

## 2024-03-19 DIAGNOSIS — C569 Malignant neoplasm of unspecified ovary: Secondary | ICD-10-CM | POA: Insufficient documentation

## 2024-03-19 DIAGNOSIS — C786 Secondary malignant neoplasm of retroperitoneum and peritoneum: Secondary | ICD-10-CM | POA: Diagnosis not present

## 2024-03-19 DIAGNOSIS — T451X5A Adverse effect of antineoplastic and immunosuppressive drugs, initial encounter: Secondary | ICD-10-CM

## 2024-03-19 DIAGNOSIS — Z79630 Long term (current) use of alkylating agent: Secondary | ICD-10-CM | POA: Diagnosis not present

## 2024-03-19 DIAGNOSIS — K5900 Constipation, unspecified: Secondary | ICD-10-CM | POA: Insufficient documentation

## 2024-03-19 LAB — CMP (CANCER CENTER ONLY)
ALT: 25 U/L (ref 0–44)
AST: 26 U/L (ref 15–41)
Albumin: 3.7 g/dL (ref 3.5–5.0)
Alkaline Phosphatase: 101 U/L (ref 38–126)
Anion gap: 7 (ref 5–15)
BUN: 28 mg/dL — ABNORMAL HIGH (ref 8–23)
CO2: 26 mmol/L (ref 22–32)
Calcium: 9.2 mg/dL (ref 8.9–10.3)
Chloride: 106 mmol/L (ref 98–111)
Creatinine: 1.21 mg/dL — ABNORMAL HIGH (ref 0.44–1.00)
GFR, Estimated: 47 mL/min — ABNORMAL LOW (ref >60)
Glucose, Bld: 91 mg/dL (ref 70–99)
Potassium: 4.2 mmol/L (ref 3.5–5.1)
Sodium: 139 mmol/L (ref 135–145)
Total Bilirubin: 0.5 mg/dL (ref 0.0–1.2)
Total Protein: 6.8 g/dL (ref 6.5–8.1)

## 2024-03-19 LAB — CBC WITH DIFFERENTIAL (CANCER CENTER ONLY)
Abs Immature Granulocytes: 0.02 K/uL (ref 0.00–0.07)
Basophils Absolute: 0 K/uL (ref 0.0–0.1)
Basophils Relative: 1 %
Eosinophils Absolute: 0.1 K/uL (ref 0.0–0.5)
Eosinophils Relative: 1 %
HCT: 34.9 % — ABNORMAL LOW (ref 36.0–46.0)
Hemoglobin: 11.3 g/dL — ABNORMAL LOW (ref 12.0–15.0)
Immature Granulocytes: 0 %
Lymphocytes Relative: 37 %
Lymphs Abs: 1.7 K/uL (ref 0.7–4.0)
MCH: 32.6 pg (ref 26.0–34.0)
MCHC: 32.4 g/dL (ref 30.0–36.0)
MCV: 100.6 fL — ABNORMAL HIGH (ref 80.0–100.0)
Monocytes Absolute: 0.4 K/uL (ref 0.1–1.0)
Monocytes Relative: 8 %
Neutro Abs: 2.4 K/uL (ref 1.7–7.7)
Neutrophils Relative %: 53 %
Platelet Count: 167 K/uL (ref 150–400)
RBC: 3.47 MIL/uL — ABNORMAL LOW (ref 3.87–5.11)
RDW: 13.9 % (ref 11.5–15.5)
WBC Count: 4.6 K/uL (ref 4.0–10.5)
nRBC: 0 % (ref 0.0–0.2)

## 2024-03-19 LAB — TOTAL PROTEIN, URINE DIPSTICK: Protein, ur: 100 mg/dL — AB

## 2024-03-19 MED ORDER — FAMOTIDINE IN NACL 20-0.9 MG/50ML-% IV SOLN
20.0000 mg | Freq: Once | INTRAVENOUS | Status: AC
  Administered 2024-03-19: 20 mg via INTRAVENOUS
  Filled 2024-03-19: qty 50

## 2024-03-19 MED ORDER — DIPHENHYDRAMINE HCL 50 MG/ML IJ SOLN
25.0000 mg | Freq: Once | INTRAMUSCULAR | Status: AC
  Administered 2024-03-19: 25 mg via INTRAVENOUS
  Filled 2024-03-19: qty 1

## 2024-03-19 MED ORDER — DEXAMETHASONE SOD PHOSPHATE PF 10 MG/ML IJ SOLN
10.0000 mg | Freq: Once | INTRAMUSCULAR | Status: AC
  Administered 2024-03-19: 10 mg via INTRAVENOUS

## 2024-03-19 MED ORDER — SODIUM CHLORIDE 0.9 % IV SOLN
INTRAVENOUS | Status: DC
  Filled 2024-03-19: qty 250

## 2024-03-19 MED ORDER — SODIUM CHLORIDE 0.9 % IV SOLN: 135.0000 mg/m2 | Freq: Once | INTRAVENOUS | Status: DC

## 2024-03-19 MED ORDER — SODIUM CHLORIDE 0.9 % IV SOLN
362.5000 mg | Freq: Once | INTRAVENOUS | Status: AC
  Administered 2024-03-19: 360 mg via INTRAVENOUS
  Filled 2024-03-19: qty 36

## 2024-03-19 MED ORDER — SODIUM CHLORIDE 0.9 % IV SOLN: 362.5000 mg | Freq: Once | INTRAVENOUS | Status: DC

## 2024-03-19 MED ORDER — PALONOSETRON HCL INJECTION 0.25 MG/5ML
0.2500 mg | Freq: Once | INTRAVENOUS | Status: AC
  Administered 2024-03-19: 0.25 mg via INTRAVENOUS
  Filled 2024-03-19: qty 5

## 2024-03-19 MED ORDER — SODIUM CHLORIDE 0.9 % IV SOLN
135.0000 mg/m2 | Freq: Once | INTRAVENOUS | Status: AC
  Administered 2024-03-19: 240 mg via INTRAVENOUS
  Filled 2024-03-19: qty 40

## 2024-03-19 MED ORDER — SENNA 8.6 MG PO TABS
2.0000 | ORAL_TABLET | Freq: Every day | ORAL | 0 refills | Status: AC
  Filled 2024-03-19: qty 120, 60d supply, fill #0

## 2024-03-19 MED ORDER — APREPITANT 130 MG/18ML IV EMUL
130.0000 mg | Freq: Once | INTRAVENOUS | Status: AC
  Administered 2024-03-19: 130 mg via INTRAVENOUS
  Filled 2024-03-19: qty 18

## 2024-03-19 NOTE — Progress Notes (Signed)
 Pt here for follow up. Pt reports that she went to ER after last tx due to bowel blockage.Pt reports that bowels are back to normal.

## 2024-03-19 NOTE — Assessment & Plan Note (Signed)
 Grade 1/2  Monitor.

## 2024-03-19 NOTE — Progress Notes (Signed)
 DISCONTINUE ON PATHWAY REGIMEN - Ovarian     A cycle is every 21 days:     Paclitaxel       Carboplatin    **Always confirm dose/schedule in your pharmacy ordering system**  PRIOR TREATMENT: [id number]: Carboplatin  AUC=6 + Paclitaxel  175 mg/m2 q21 Days x 2-4 Cycles  START ON PATHWAY REGIMEN - Ovarian     Olaparib: A cycle is every 28 days:     Olaparib    Bevacizumab: A cycle is every 21 days:     Bevacizumab-xxxx   **Always confirm dose/schedule in your pharmacy ordering system**  Patient Characteristics: High Grade Histologies, Postoperative Following Neoadjuvant Therapy (Neoadjuvant Pathologic Staging), Newly Diagnosed, Maintenance Therapy (Stage III or IV), BRCA-Mutation Absent, HRD or HR-Unknown, Initial Carboplatin  + Paclitaxel  + Bevacizumab Histology: High Grade Histologies BRCA Mutation Status: Absent Therapeutic Status: Postoperative Following Neoadjuvant Therapy (Neoadjuvant Pathologic Staging) AJCC 8 Stage Grouping: IIIC AJCC M Category: cM0 AJCC T Category: ypT3c AJCC N Category: ypNX Has both neoadjuvant and adjuvant chemotherapy been completed<= Yes Homologous Recombination Status: HRD Intent of Therapy: Non-Curative / Palliative Intent, Discussed with Patient

## 2024-03-19 NOTE — Progress Notes (Signed)
 Hematology/Oncology Progress note [phone] [phone]    CHIEF COMPLAINTS/REASON FOR VISIT:  High-grade serous carcinoma of ovaries. peritoneal carcinomatosis, liver lesion.   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  Elevated CA 125 609, elevated CA 27.29 -415  -Mychoice HRD positive  S/p neoadjuvant carboplatin /Taxol  x 4 [Bevacizumab  was not given in neoadjuvant setting due to tumor abutting bowel wall]  S/p  debulking surgery.  Recommend additional 3 cycles of chemotherapy. Labs are reviewed and discussed with patient. Proceed with cycle 7 Carboplatin  Taxol , and Bevacizumab .  Addendum: Bevacizumab  is hold today due to increase of urine protein.   Plan Bevacizumab  Q3 weeks  plus Olarparib 200mg  BID maintenance.  Rationale and side effects were reviewed with patient.  Will check insurance approval.   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   CKD (chronic kidney disease) stage 3, GFR 30-59 ml/min (HCC) Encouraged oral hydration, avoid nephrotoxins.   Encounter for antineoplastic chemotherapy Chemotherapy treatments as listed above.   Constipation Recommend Sennkot 2 tabs daily, plus Laclulose 10-20 g 2-3 times daily.  If not helpful, recommend Miralax  PRN.   Proteinuria Hold off Bevacizumab  today.  If no improvement, consider nephrology evaluation, adding losartan.   Orders Placed This Encounter  Procedures   Consent Attestation for Oncology Treatment    The patient is informed of risks, benefits, side-effects of the prescribed oncology treatment. Potential short term and long term side effects  and response rates discussed. After a long discussion, the patient made informed decision to proceed.:   Yes   CBC with Differential (Cancer Center Only)    Standing Status:   Future    Expected Date:   04/09/2024    Expiration Date:   04/09/2025   Total Protein, Urine dipstick    Standing Status:   Future    Expected Date:   04/09/2024    Expiration Date:   04/09/2025   CA 125    Standing Status:   Future    Expected Date:   04/09/2024    Expiration Date:   04/09/2025   CMP (Cancer Center only)    Standing Status:   Future    Expected Date:   04/09/2024    Expiration Date:   04/09/2025   PHYSICIAN COMMUNICATION ORDER    UA Protein, dipstick required prior to every 3rd cycle bevacizumab    Follow-up 3 weeks.   All questions were answered. The patient knows to call the clinic with any problems, questions or concerns.  Call Babara, MD, PhD [facility] Hematology Oncology 03/19/2024   HISTORY OF PRESENTING ILLNESS:   Beth Hart is a  73 y.o.  female with PMH listed below presents for follow up of stage IV ovarian high grade serous carcinoma.  Oncology History  Peritoneal carcinomatosis (HCC)  09/18/2023 Initial Diagnosis   Peritoneal carcinomatosis (HCC)   10/10/2023 -  Chemotherapy   Patient is on Treatment Plan : OVARIAN Carboplatin   + Paclitaxel  ) q21d      Genetic Testing   Negative genetic testing. No pathogenic variants identified on the [facility] CancerNext+RNA panel. The report date is 10/20/2023.  The [facility] CancerNext+RNAinsight Panel includes sequencing, rearrangement analysis, and RNA analysis for  the following 40 genes: APC, ATM, BAP1, BARD1, BMPR1A, BRCA1, BRCA2, BRIP1, CDH1, CDKN2A, CHEK2, FH, FLCN, MET, MLH1, MSH2, MSH6, MUTYH, NF1, NTHL1, PALB2, PMS2, PTEN, RAD51C, RAD51D, RPS20, SMAD4, STK11, TP53, TSC1, TSC2, and VHL (sequencing and deletion/duplication); AXIN2, HOXB13, MBD4, MSH3, POLD1 and POLE (sequencing only); EPCAM and GREM1 (deletion/duplication only).    Ovarian cancer (HCC)  09/25/2023 Cancer Staging   Staging form: Ovary, Fallopian Tube, and Primary Peritoneal Carcinoma, AJCC 8th Edition - Clinical stage from 09/25/2023: FIGO Stage IV (cT3c, cN1b, cM1) - Signed by Babara Call, MD on 10/05/2023 Stage prefix: Initial diagnosis   10/04/2023 Initial Diagnosis   Ovarian cancer  She experiences pressure and sharp pain in her abdomen, initially attributing it to a pulled muscle from yard work. The pain persisted and was accompanied by a fever, prompting her to visit the ER on 09/16/2023. The fever was around 100F at home and remained at that level upon arrival at the ER. She has a persistent cough, especially when lying down.   09/16/2023, CT abdomen pelvis with contrast showed extensive omental and peritoneal carcinomatosis with small to moderate ascites. multiple hypoattenuating masses in the liver and spleen, compatible with metastases. There are also multiple mildly enlarged retroperitoneal lymph nodes, also highly concerning for metastases.  09/24/2023, cytology from peritoneal ascites showed negative for malignancy. 09/25/2023, endometrial biopsy showed no malignancy. 09/26/2023 Patient underwent peritoneal mass biopsy,  1. Peritoneum, biopsy, RUQ mass :       - METASTATIC HIGH-GRADE SEROUS CARCINOMA WITH NECROSIS, SEE NOTE   Diagnosis Note : Immunohistochemical stains show that the tumor cells are positive for PAX8, p16 and p53 (clonal overexpression pattern) while they are negative for p63 and CK5/6.  This immunoprofile is consistent with above interpretation.    Tumor Testing: [facility] shows HRD but no somatic BRCA mutation Genomic instability  score of 43.   10/10/2023 -  Chemotherapy   Patient is on Treatment Plan : OVARIAN Carboplatin   + Paclitaxel  ) q21d      Imaging   CT chest w contrast  Several scattered sub 5 mm bilateral lung nodules identified. Based on appearance and other findings these are worrisome for early  lung metastases. Few enlarged pre cardiophrenic mediastinal nodes as well as left internal mammary chain lymph nodes also worrisome for spread of disease.Tiny left pleural effusion.   Please correlate with previous abdomen pelvis CT describing abdominal findings.      Genetic Testing   Negative genetic testing. No pathogenic variants identified on the [facility] CancerNext+RNA panel. The report date is 10/20/2023.  The [facility] CancerNext+RNAinsight Panel includes sequencing, rearrangement analysis, and RNA analysis for the following 40 genes: APC, ATM, BAP1, BARD1, BMPR1A, BRCA1, BRCA2, BRIP1, CDH1, CDKN2A, CHEK2, FH, FLCN, MET, MLH1, MSH2, MSH6, MUTYH, NF1, NTHL1, PALB2, PMS2, PTEN, RAD51C, RAD51D, RPS20, SMAD4, STK11, TP53, TSC1, TSC2, and VHL (sequencing and deletion/duplication); AXIN2, HOXB13, MBD4, MSH3, POLD1 and POLE (sequencing only); EPCAM and GREM1 (deletion/duplication only).   01/07/2024 Surgery   Debulking surgery  Pathology showed  A. Omentum, omentectomy:   High-grade serous carcinoma (7.5 cm).     B. Left fallopian tube and ovary, left salpingo-oophorectomy:   High-grade serous carcinoma with surface involvement of the ovary. Benign fallopian tube.     C. Right fallopian tube and ovary, right salpingo-oophorectomy:   High-grade serous carcinoma with surface involvement of the ovary and fallopian tube.     D. Uterus and cervix, hysterectomy:   Uterus (39 grams): Endometrium: Inactive. Myometrium: Benign.  Cervix: Benign.  Serosa: High-grade serous carcinoma.  TUMOR    Tumor Site:    Bilateral ovaries     Tumor Size:    Greatest Dimension (Centimeters): 1.7 cm     Histologic Type:    High-grade serous carcinoma     Ovarian Surface Involvement:    Present, right and left     Fallopian Tube Surface Involvement:    Present, right     Other Tissue / Organ Involvement:    Right ovary     Other Tissue / Organ Involvement:    Left ovary     Other Tissue / Organ  Involvement:    Right fallopian tube     Other Tissue / Organ Involvement:    Omentum     Other Tissue / Organ Involvement:    uterine serosa     Largest Extrapelvic Peritoneal Focus:    Macroscopic (greater than 2 cm): Omentum     Peritoneal / Ascitic Fluid Involvement:    Not submitted / unknown     Chemotherapy Response Score (CRS):    CRS1 (no definite or minimal response)   REGIONAL LYMPH NODES     Regional Lymph Node Status:    Not applicable (no regional lymph nodes submitted or found)   pTNM CLASSIFICATION (AJCC 8th Edition)     Reporting of pT, pN, and (when applicable) pM categories is based on information available to the pathologist at the time the report is issued. As per the AJCC (Chapter 1, 8th Ed.) it is the managing physician's responsibility to establish the final pathologic stage based upon all pertinent information, including but potentially not limited to this pathology report.     Modified Classification:    y     pT Category:    pT3c     pN Category:    pN not assigned (no nodes submitted or found)       Her family history includes father who had lung cancer attributed to smoking, but no history of colon, ovarian, or breast cancer.  She underwent a colonoscopy a few years ago, which was reported to be negative.  And a mammogram six months ago, which was negative she does not recall any recent stomach pain, dysphagia, or acid reflux.  INTERVAL HISTORY Beth Hart is a 73 y.o. female who has above history reviewed by me today presents for follow up visit for Stage IV ovarian cancer.  Discussed the use of AI scribe software for clinical note transcription with the patient, who gave verbal consent to proceed.   She went to ER on 03/02/2024 due to severe constipation and dehydration.Abd xray showed large stool burden. Previously lactulose  was helpful but it has recently become less effective. After taking Miralax in triple doses three times a day, symptom improved.    She is not using oxycodone  for pain management and takes anti-nausea medication for three days following chemotherapy. She reports difficulty staying hydrated, particularly in the week following treatment, as she finds it challenging to drink fluids due to taste changes  Today she has no nausea vomiting abdominal pain.   MEDICAL HISTORY:  Past Medical History:  Diagnosis Date   Anemia    Arthritis    Asthma    as a child   Cataract 2019   Chicken pox    Chronic kidney disease 2015   Colon polyps 2012   Depression    Hemorrhoids    Ovarian cancer (HCC)     SURGICAL HISTORY: Past Surgical History:  Procedure Laterality Date   ABDOMINAL HYSTERECTOMY     BREAST BIOPSY  03/09/2024   RIGHT BREAST STEREO CALCS-RIBBON MARKER-PATH PENDING   BREAST BIOPSY Right 03/09/2024   MM RT BREAST BX W LOC DEV 1ST LESION IMAGE BX SPEC STEREO GUIDE 03/09/2024 [facility]   BREAST CYST ASPIRATION Left 90s   BREAST SURGERY  2007   aspiration   CARPAL TUNNEL RELEASE  2010   REFRACTIVE SURGERY     TONSILLECTOMY      SOCIAL HISTORY: Social History   Socioeconomic History   Marital status: Married    Spouse name: Not on file   Number of children: Not on file   Years of education: Not on file   Highest education level: Associate degree: occupational, scientist, product/process development, or vocational program  Occupational History   Not on file  Tobacco Use   Smoking status: Never   Smokeless tobacco: Never  Vaping Use   Vaping status: Never Used  Substance and Sexual Activity   Alcohol use: No   Drug use: No   Sexual activity: Yes  Other Topics Concern   Not on file  Social History Narrative   married   Social Drivers of Corporate Investment Banker Strain: Low Risk  (02/07/2024)   Overall Financial Resource Strain (CARDIA)    Difficulty of Paying Living Expenses: Not hard at all  Food Insecurity: No Food Insecurity (02/07/2024)   Hunger Vital Sign    Worried About Running Out of Food in the  Last Year: Never true    Ran Out of Food in the Last Year: Never true  Transportation Needs: No Transportation Needs (02/07/2024)   PRAPARE - Administrator, Civil Service (Medical): No    Lack of Transportation (Non-Medical): No  Physical Activity: Inactive (02/07/2024)   Exercise Vital Sign    Days of Exercise per Week: 0 days    Minutes of Exercise per Session: Not on file  Stress: No Stress Concern Present (02/07/2024)   [employer] of Occupational Health - Occupational Stress Questionnaire    Feeling of Stress: Not at all  Social Connections: Moderately Integrated (02/07/2024)   Social Connection and Isolation Panel    Frequency of Communication with Friends and Family: Once a week    Frequency of Social Gatherings with Friends and Family: Once a week    Attends Religious Services: More than 4 times per year    Active Member of [employer] or Organizations: Yes    Attends Engineer, Structural: More than 4 times per year    Marital Status: Married  Catering Manager Violence: Not At Risk (09/25/2023)   Humiliation, Afraid, Rape, and Kick questionnaire    Fear of Current or Ex-Partner: No    Emotionally Abused: No    Physically Abused: No    Sexually Abused: No    FAMILY HISTORY: Family History  Problem Relation Age of Onset   Alzheimer's disease Mother    Depression Mother    Vision loss Mother    Lung cancer Father    Cancer Father    Prostate cancer Maternal Uncle    Prostate cancer Maternal Uncle    Prostate cancer Maternal Uncle    Prostate cancer Maternal Uncle    Prostate cancer Maternal Uncle    Prostate cancer Other        in mat great uncles, unk#   Prostate cancer Cousin        in 4-5 mat  first cousins   Breast cancer  Neg Hx     ALLERGIES:  has no known allergies.  MEDICATIONS:  Current Outpatient Medications  Medication Sig Dispense Refill   citalopram  (CELEXA ) 40 MG tablet Take 1 tablet (40 mg total) by mouth daily. 30 tablet 3    dexamethasone  (DECADRON ) 4 MG tablet Take 2 tablets (8mg ) by mouth daily starting the day after carboplatin  for 3 days. Take with food 30 tablet 1   lactulose  (CHRONULAC ) 10 GM/15ML solution take15 to 30 ml 3 times a day when needed for constipation 236 mL 0   ondansetron  (ZOFRAN ) 8 MG tablet Take 1 tablet (8 mg total) by mouth every 8 (eight) hours as needed for nausea or vomiting. Start on the third day after carboplatin . 30 tablet 1   oxyCODONE  (OXY IR/ROXICODONE ) 5 MG immediate release tablet Take 1 tablet (5 mg total) by mouth every 8 (eight) hours as needed for severe pain (pain score 7-10). 30 tablet 0   polyethylene glycol powder (GLYCOLAX /MIRALAX ) 17 GM/SCOOP powder Take by mouth once.     prochlorperazine  (COMPAZINE ) 10 MG tablet Take 1 tablet (10 mg total) by mouth every 6 (six) hours as needed for nausea or vomiting. 30 tablet 1   senna (SENOKOT) 8.6 MG TABS tablet Take 2 tablets (17.2 mg total) by mouth daily. 120 tablet 0   No current facility-administered medications for this visit.   Facility-Administered Medications Ordered in Other Visits  Medication Dose Route Frequency Provider Last Rate Last Admin   0.9 %  sodium chloride  infusion   Intravenous Continuous Babara Call, MD 10 mL/hr at 03/19/24 0953 New Bag at 03/19/24 0953   CARBOplatin  (PARAPLATIN ) 360 mg in sodium chloride  0.9 % 100 mL chemo infusion  360 mg Intravenous Once Babara Call, MD       heparin  lock flush 100 unit/mL  500 Units Intravenous Once Christianna Belmonte, MD       PACLitaxel  (TAXOL ) 240 mg in sodium chloride  0.9 % 250 mL chemo infusion (> 80mg /m2)  135 mg/m2 (Treatment Plan Recorded) Intravenous Once Babara Call, MD 97 mL/hr at 03/19/24 1125 240 mg at 03/19/24 1125    Review of Systems  Constitutional:  Positive for fatigue. Negative for appetite change, chills, fever and unexpected weight change.  HENT:   Negative for hearing loss and voice change.   Eyes:  Negative for eye problems.  Respiratory:  Negative for chest  tightness and cough.   Cardiovascular:  Negative for chest pain.  Gastrointestinal:  Positive for constipation. Negative for abdominal distention, abdominal pain, blood in stool, nausea and vomiting.  Endocrine: Negative for hot flashes.  Genitourinary:  Negative for difficulty urinating and frequency.   Musculoskeletal:  Negative for arthralgias.  Skin:  Negative for itching and rash.  Neurological:  Negative for extremity weakness.  Hematological:  Negative for adenopathy.  Psychiatric/Behavioral:  Positive for depression and sleep disturbance. Negative for confusion.    PHYSICAL EXAMINATION: ECOG PERFORMANCE STATUS: 1 - Symptomatic but completely ambulatory Vitals:   03/19/24 0849  BP: 123/78  Pulse: 77  Resp: 18  Temp: 98.3 F (36.8 C)  SpO2: 100%    Filed Weights   03/19/24 0849  Weight: 163 lb 11.2 oz (74.3 kg)     Physical Exam Constitutional:      General: She is not in acute distress. HENT:     Head: Normocephalic and atraumatic.  Eyes:     General: No scleral icterus. Cardiovascular:     Rate and Rhythm: Normal rate and regular rhythm.  Heart sounds: Normal heart sounds.  Pulmonary:     Effort: Pulmonary effort is normal. No respiratory distress.     Breath sounds: No wheezing.  Abdominal:     General: Bowel sounds are normal. There is no distension.     Palpations: Abdomen is soft.  Musculoskeletal:        General: No deformity. Normal range of motion.     Cervical back: Normal range of motion and neck supple.  Skin:    General: Skin is warm and dry.     Findings: No erythema or rash.  Neurological:     Mental Status: She is alert and oriented to person, place, and time. Mental status is at baseline.  Psychiatric:        Mood and Affect: Mood normal.     LABORATORY DATA:  I have reviewed the data as listed    Latest Ref Rng & Units 03/19/2024    8:37 AM 02/27/2024    8:20 AM 02/06/2024    8:07 AM  CBC  WBC 4.0 - 10.5 K/uL 4.6  7.2  6.4    Hemoglobin 12.0 - 15.0 g/dL 88.6  88.5  88.9   Hematocrit 36.0 - 46.0 % 34.9  35.8  34.4   Platelets 150 - 400 K/uL 167  244  233       Latest Ref Rng & Units 03/19/2024    8:37 AM 02/27/2024    8:20 AM 02/06/2024    8:07 AM  CMP  Glucose 70 - 99 mg/dL 91  887  891   BUN 8 - 23 mg/dL 28  34  28   Creatinine 0.44 - 1.00 mg/dL 8.78  8.68  8.78   Sodium 135 - 145 mmol/L 139  134  136   Potassium 3.5 - 5.1 mmol/L 4.2  4.9  4.4   Chloride 98 - 111 mmol/L 106  103  105   CO2 22 - 32 mmol/L 26  22  23    Calcium 8.9 - 10.3 mg/dL 9.2  9.3  9.3   Total Protein 6.5 - 8.1 g/dL 6.8  7.1  7.1   Total Bilirubin 0.0 - 1.2 mg/dL 0.5  0.6  0.5   Alkaline Phos 38 - 126 U/L 101  99  103   AST 15 - 41 U/L 26  30  26    ALT 0 - 44 U/L 25  27  22        RADIOGRAPHIC STUDIES: I have personally reviewed the radiological images as listed and agreed with the findings in the report. MM RT BREAST BX W LOC DEV 1ST LESION IMAGE BX SPEC STEREO GUIDE Addendum Date: 03/11/2024 ADDENDUM REPORT: 03/11/2024 11:39 ADDENDUM: PATHOLOGY revealed: Breast, right, needle core biopsy, calcifications, 3:00 posterior depth, 3 mm (ribbon clip)- FIBROCYSTIC CHANGE WITH FIBROADENOMATOID CHANGE AND DYSTROPHIC CALCIFICATIONS- NEGATIVE FOR CARICNOMA Pathology results are CONCORDANT with imaging findings, per Dr. Norleen Croak. Pathology results and recommendations were discussed with patient via telephone on 03/10/2024 by Mliss Molt RN. Patient reported biopsy site doing well with no adverse symptoms, and only slight tenderness at the site. Post biopsy care instructions were reviewed, questions were answered and my direct phone number was provided. Patient was instructed to call [facility] for any additional questions or concerns related to biopsy site. RECOMMENDATION: Patient instructed to resume annual bilateral screening mammogram due October 2026. Pathology results reported by Mliss Molt RN 03/10/2024. Electronically  Signed   By: Norleen Croak M.D.   On: 03/11/2024  11:39   Result Date: 03/11/2024 CLINICAL DATA:  Indeterminate RIGHT breast calcifications EXAM: RIGHT BREAST STEREOTACTIC CORE NEEDLE BIOPSY COMPARISON:  Previous exam(s). FINDINGS: The patient and I discussed the procedure of stereotactic-guided biopsy including benefits and alternatives. We discussed the high likelihood of a successful procedure. We discussed the risks of the procedure including infection, bleeding, tissue injury, clip migration, and inadequate sampling. Informed written consent was given. The usual time out protocol was performed immediately prior to the procedure. Using sterile technique and 1% lidocaine  and 1% lidocaine  with epinephrine as local anesthetic, under stereotactic guidance, a 9 gauge vacuum assisted device was used to perform core needle biopsy of calcifications in the RIGHT breast at 3 o'clock using a MEDIAL approach. Specimen radiograph was performed showing representative calcifications. Specimens with calcifications are identified for pathology. Lesion quadrant: Upper inner At the conclusion of the procedure, ribbon shaped tissue marker clip was deployed into the biopsy cavity. Follow-up 2-view mammogram was performed and dictated separately. IMPRESSION: Stereotactic-guided biopsy of RIGHT breast calcifications at 3 o'clock posterior depth. No apparent complications. Electronically Signed: By: Norleen Croak M.D. On: 03/09/2024 09:03   MM CLIP PLACEMENT RIGHT Result Date: 03/09/2024 CLINICAL DATA:  Status post RIGHT breast stereotactic biopsy EXAM: 3D DIAGNOSTIC RIGHT MAMMOGRAM POST STEREOTACTIC BIOPSY COMPARISON:  Previous exam(s). ACR Breast Density Category c: The breasts are heterogeneously dense, which may obscure small masses. FINDINGS: 3D Mammographic images were obtained following stereotactic guided biopsy of RIGHT breast calcifications at 3 o'clock. The biopsy marking clip is in expected position at the site of  biopsy. IMPRESSION: Appropriate positioning of the ribbon shaped biopsy marking clip at the site of biopsy in the RIGHT breast. Final Assessment: Post Procedure Mammograms for Marker Placement Electronically Signed   By: Norleen Croak M.D.   On: 03/09/2024 09:10   DG Abdomen 1 View Result Date: 03/02/2024 EXAM: 1 VIEW XRAY OF THE ABDOMEN 03/02/2024 08:53:00 PM COMPARISON: CT 12/02/2023. CLINICAL HISTORY: Constipation. FINDINGS: BOWEL: Large stool burden throughout the colon. Nonobstructive bowel gas pattern. SOFT TISSUES: No opaque urinary calculi. BONES: No acute osseous abnormality. IMPRESSION: 1. Large stool burden throughout the colon. Electronically signed by: Franky Crease MD 03/02/2024 09:08 PM EST RP Workstation: [id number]   MM 3D DIAGNOSTIC MAMMOGRAM UNILATERAL RIGHT BREAST Result Date: 02/28/2024 CLINICAL DATA:  Screening recall RIGHT breast calcifications EXAM: DIGITAL DIAGNOSTIC UNILATERAL RIGHT MAMMOGRAM WITH TOMOSYNTHESIS AND CAD TECHNIQUE: Right digital diagnostic mammography and breast tomosynthesis was performed. The images were evaluated with computer-aided detection. COMPARISON:  Previous exam(s). ACR Breast Density Category c: The breasts are heterogeneously dense, which may obscure small masses. FINDINGS: Full field lateral and spot magnification view(s) were performed and demonstrate grouped coarse heterogeneous calcifications at the RIGHT breast 3 o'clock position posterior depth measuring 3 mm in diameter. No other suspicious findings in the RIGHT breast. IMPRESSION: Indeterminate RIGHT breast calcifications spanning 3 mm in the RIGHT breast 3 o'clock position posterior depth. Stereotactic biopsy is recommended. RECOMMENDATION: Stereotactic biopsy of the RIGHT breast x1 I have discussed the findings and recommendations with the patient. The biopsy procedure was discussed with the patient and questions were answered. Patient expressed their understanding of the biopsy recommendation.  Patient will be scheduled for biopsy at her earliest convenience by the schedulers. Ordering provider will be notified. If applicable, a reminder letter will be sent to the patient regarding the next appointment. BI-RADS CATEGORY  4: Suspicious. Electronically Signed   By: Norleen Croak M.D.   On: 02/28/2024 11:02   MM 3D SCREENING  MAMMOGRAM BILATERAL BREAST Result Date: 02/26/2024 CLINICAL DATA:  Screening. EXAM: DIGITAL SCREENING BILATERAL MAMMOGRAM WITH TOMOSYNTHESIS AND CAD TECHNIQUE: Bilateral screening digital craniocaudal and mediolateral oblique mammograms were obtained. Bilateral screening digital breast tomosynthesis was performed. The images were evaluated with computer-aided detection. COMPARISON:  Previous exam(s). ACR Breast Density Category c: The breasts are heterogeneously dense, which may obscure small masses. FINDINGS: In the right breast, calcifications about the slightly upper slightly inner breast, middle to posterior third, warrant further evaluation with magnified views. In the left breast, no findings suspicious for malignancy. IMPRESSION: Further evaluation is suggested for calcifications in the right breast. RECOMMENDATION: Diagnostic mammogram of the right breast. (Code:[id number]) The patient will be contacted regarding the findings, and additional imaging will be scheduled. BI-RADS CATEGORY  0: Incomplete: Need additional imaging evaluation. Electronically Signed   By: Curtistine Noble   On: 02/26/2024 14:34

## 2024-03-19 NOTE — Assessment & Plan Note (Signed)
 Hold off Bevacizumab today.  If no improvement, consider nephrology evaluation, adding losartan.

## 2024-03-19 NOTE — Assessment & Plan Note (Signed)
 Recommend Sennkot 2 tabs daily, plus Laclulose 10-20 g 2-3 times daily.  If not helpful, recommend Miralax PRN.

## 2024-03-19 NOTE — Assessment & Plan Note (Signed)
 Chemotherapy treatments as listed above.

## 2024-03-19 NOTE — Assessment & Plan Note (Signed)
 Encouraged oral hydration, avoid nephrotoxins.

## 2024-03-19 NOTE — Progress Notes (Signed)
 ON PATHWAY REGIMEN - Ovarian  No Change  Continue With Treatment as Ordered.  Original Decision Date/Time: 10/04/2023 13:10     A cycle is every 21 days:     Paclitaxel       Carboplatin    **Always confirm dose/schedule in your pharmacy ordering system**  Patient Characteristics: Preoperative or Nonsurgical Candidate (Clinical Staging), Newly Diagnosed, Neoadjuvant Therapy followed by Surgery BRCA Mutation Status: Awaiting Test Results Therapeutic Status: Preoperative or Nonsurgical Candidate (Clinical Staging) AJCC T Category: cT3c AJCC 8 Stage Grouping: IIIC AJCC N Category: cNX AJCC M Category: cM0 Therapy Plan: Neoadjuvant Therapy followed by Surgery Intent of Therapy: Non-Curative / Palliative Intent, Discussed with Patient

## 2024-03-19 NOTE — Assessment & Plan Note (Addendum)
 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  Elevated CA 125 609, elevated CA 27.29 -415  -Mychoice HRD positive  S/p neoadjuvant carboplatin /Taxol  x 4 [Bevacizumab was not given in neoadjuvant setting due to tumor abutting bowel wall]  S/p  debulking surgery.  Recommend additional 3 cycles of chemotherapy. Labs are reviewed and discussed with patient. Proceed with cycle 7 Carboplatin  Taxol , and Bevacizumab.  Addendum: Bevacizumab is hold today due to increase of urine protein.   Plan Bevacizumab Q3 weeks  plus Olarparib 200mg  BID maintenance.  Rationale and side effects were reviewed with patient.  Will check insurance approval.

## 2024-03-20 ENCOUNTER — Encounter: Payer: Self-pay | Admitting: Oncology

## 2024-03-20 ENCOUNTER — Telehealth: Payer: Self-pay | Admitting: Pharmacy Technician

## 2024-03-20 ENCOUNTER — Telehealth: Payer: Self-pay | Admitting: Pharmacist

## 2024-03-20 ENCOUNTER — Other Ambulatory Visit (HOSPITAL_COMMUNITY): Payer: Self-pay

## 2024-03-20 DIAGNOSIS — C563 Malignant neoplasm of bilateral ovaries: Secondary | ICD-10-CM

## 2024-03-20 LAB — CA 125: Cancer Antigen (CA) 125: 47.3 U/mL — ABNORMAL HIGH (ref 0.0–38.1)

## 2024-03-20 MED ORDER — OLAPARIB 100 MG PO TABS
200.0000 mg | ORAL_TABLET | Freq: Two times a day (BID) | ORAL | 1 refills | Status: DC
  Filled 2024-03-23: qty 120, 30d supply, fill #0
  Filled 2024-04-14 – 2024-04-15 (×2): qty 120, 30d supply, fill #1

## 2024-03-20 NOTE — Telephone Encounter (Signed)
 Oral Oncology Patient Advocate Encounter  Prior Authorization for Lynparza  has been approved.    PA# [id number] Effective dates: 05/01/2023 through 04/29/2025  Patients co-pay is $1,767.12.    Narda Fundora (Patty) Chet Burnet, CPhT  [facility], [place], [facility] - Oral Chemotherapy Patient Advocate Specialist III Phone: [phone]  Fax: [phone]

## 2024-03-20 NOTE — Telephone Encounter (Signed)
 Oral Oncology Patient Advocate Encounter   Was successful in securing patient an $54 grant from [employer] [facility]) to provide copayment coverage for Lynparza .  This will keep the out of pocket expense at $0.     The billing information is as follows and has been shared with [facility].   Member ID: [id number] Group ID: [id number] RxBin: [id number] Dates of Eligibility: 12/20/2023 through 03/18/2025  Fund:  Ovarian Cancer grant  [place] (Patty) Chet Burnet, CPhT  [facility], Zelda Salmon, [place] Hematology/Oncology - Oral Chemotherapy Patient Advocate Specialist III Phone: [phone]  Fax: [phone]

## 2024-03-20 NOTE — Telephone Encounter (Signed)
 Clinical Pharmacist Practitioner Encounter   Received new prescription for Lynparza  (olaparib ) for the maintenance treatment of HRD positive ovarian cancer in conjunction with bevacizumab , planned duration until unacceptable toxicity, disease progression, or completion of 2 years of treatment.  CMP from 03/19/24 assessed, SCr 1.21 and CrCl ~10ml/min.  Patient's dose has been reduced to 200 mg twice daily due to renal impairment. Prescription dose and frequency assessed.   Current medication list in Epic reviewed, no DDIs with olaparib  identified.  Evaluated chart and no patient barriers to medication adherence identified.   Prescription has been e-scribed to the [facility] for benefits analysis and approval.  Oral Oncology Clinic will continue to follow for insurance authorization, copayment issues, initial counseling and start date.   Merrilyn Legler N. Sumedha Munnerlyn, PharmD, BCOP, CPP Hematology/Oncology Clinical Pharmacist [facility] Oral Chemotherapy Navigation Clinic [phone]  03/20/2024 10:21 AM

## 2024-03-20 NOTE — Telephone Encounter (Signed)
 Oral Oncology Patient Advocate Encounter  Was successful in securing patient a $3500 grant from [facility] to provide copayment coverage for Lynparza .  This will keep the out of pocket expense at $0.     [employer] ID: [id number]   The billing information is as follows and has been shared with [facility].    RxBin: [id number] PCN: [id number] Member ID: [id number] Group ID: [id number] Dates of Eligibility: 02/19/2024 through 02/17/2025  Fund:  Ovarian Cancer - Medicare Access  [place] (Patty) Chet Burnet, CPhT  [facility], Zelda [place], [facility] - Oral Chemotherapy Patient Advocate Specialist III Phone: [phone]  Fax: [phone]

## 2024-03-20 NOTE — Telephone Encounter (Signed)
 Oral Oncology Patient Advocate Encounter   New authorization   Received notification that prior authorization for Lynparza  is required.   PA submitted on CMM via Latent Key [id number] Status is pending     Sair Faulcon (Patty) Chet Burnet, CPhT  [facility], [place], [facility] - Oral Chemotherapy Patient Advocate Specialist III Phone: [phone]  Fax: [phone]

## 2024-03-23 ENCOUNTER — Telehealth: Payer: Self-pay | Admitting: Pharmacy Technician

## 2024-03-23 ENCOUNTER — Other Ambulatory Visit: Payer: Self-pay

## 2024-03-23 ENCOUNTER — Other Ambulatory Visit: Payer: Self-pay | Admitting: Pharmacy Technician

## 2024-03-23 ENCOUNTER — Encounter: Payer: Self-pay | Admitting: Oncology

## 2024-03-23 NOTE — Progress Notes (Signed)
 Specialty Pharmacy Initial Fill Coordination Note  Beth Hart is a 73 y.o. female contacted today regarding initial fill of specialty medication(s) Olaparib  (LYNPARZA )   Patient requested Delivery   Delivery date: 03/25/24   Verified address: [address] [place] [address]   Medication will be filled on: 03/24/24   Patient is aware of $0 copayment. Leisure centre manager.  Violette Morneault (Patty) Chet Burnet, CPhT  [facility], Zelda [place], [facility] - Oral Chemotherapy Patient Advocate Specialist III Phone: [phone]  Fax: [phone]

## 2024-03-23 NOTE — Telephone Encounter (Signed)
 Oral Oncology Patient Advocate Encounter  Patient successfully OnBoarded and drug education provided by pharmacist. Medication scheduled to be shipped on 11/25 for delivery on 11/26 from [facility] to patient's address. Patient also knows to call me at [phone] with any questions or concerns regarding receiving medication or if there is any unexpected change in co-pay.    Beth Hart (Patty) Chet Burnet, CPhT  [facility], [place], [facility] - Oral Chemotherapy Patient Advocate Specialist III Phone: [phone]  Fax: [phone]

## 2024-03-23 NOTE — Progress Notes (Signed)
 Specialty Pharmacy Initiation Note   Beth Hart is a 73 y.o. female who will be followed by the specialty pharmacy service for RxSp Oncology    Review of administration, indication, effectiveness, safety, potential side effects, storage/disposable, and missed dose instructions occurred today for patient's specialty medication(s) No data recorded    Patient/Caregiver did not have any additional questions or concerns.   Patient's therapy is appropriate to: Initiate    Goals Addressed             This Visit's Progress    Slow Disease Progression       Patient is initiating therapy. Patient will maintain adherence         Rochester Serpe N Sabrena Gavitt Specialty Pharmacist

## 2024-03-24 ENCOUNTER — Other Ambulatory Visit: Payer: Self-pay

## 2024-03-24 ENCOUNTER — Encounter: Payer: Self-pay | Admitting: Oncology

## 2024-03-30 ENCOUNTER — Encounter: Payer: Self-pay | Admitting: Oncology

## 2024-03-30 NOTE — Addendum Note (Signed)
 Addended by: Bashar Milam E on: 03/30/2024 02:54 PM   Modules accepted: Orders

## 2024-04-02 ENCOUNTER — Encounter: Payer: Self-pay | Admitting: Oncology

## 2024-04-09 ENCOUNTER — Encounter: Payer: Self-pay | Admitting: Oncology

## 2024-04-09 ENCOUNTER — Inpatient Hospital Stay: Attending: Oncology

## 2024-04-09 ENCOUNTER — Inpatient Hospital Stay: Admitting: Oncology

## 2024-04-09 ENCOUNTER — Inpatient Hospital Stay

## 2024-04-09 ENCOUNTER — Other Ambulatory Visit: Payer: Self-pay

## 2024-04-09 ENCOUNTER — Telehealth: Payer: Self-pay

## 2024-04-09 ENCOUNTER — Inpatient Hospital Stay: Admitting: Pharmacist

## 2024-04-09 VITALS — BP 176/74 | HR 63 | Resp 18

## 2024-04-09 VITALS — BP 146/86 | HR 83 | Temp 99.1°F | Resp 18 | Wt 167.4 lb

## 2024-04-09 DIAGNOSIS — K5903 Drug induced constipation: Secondary | ICD-10-CM | POA: Diagnosis not present

## 2024-04-09 DIAGNOSIS — G620 Drug-induced polyneuropathy: Secondary | ICD-10-CM | POA: Insufficient documentation

## 2024-04-09 DIAGNOSIS — Z90710 Acquired absence of both cervix and uterus: Secondary | ICD-10-CM | POA: Insufficient documentation

## 2024-04-09 DIAGNOSIS — Z9221 Personal history of antineoplastic chemotherapy: Secondary | ICD-10-CM | POA: Insufficient documentation

## 2024-04-09 DIAGNOSIS — C787 Secondary malignant neoplasm of liver and intrahepatic bile duct: Secondary | ICD-10-CM | POA: Insufficient documentation

## 2024-04-09 DIAGNOSIS — T451X5A Adverse effect of antineoplastic and immunosuppressive drugs, initial encounter: Secondary | ICD-10-CM | POA: Diagnosis not present

## 2024-04-09 DIAGNOSIS — C786 Secondary malignant neoplasm of retroperitoneum and peritoneum: Secondary | ICD-10-CM

## 2024-04-09 DIAGNOSIS — N183 Chronic kidney disease, stage 3 unspecified: Secondary | ICD-10-CM | POA: Diagnosis not present

## 2024-04-09 DIAGNOSIS — R978 Other abnormal tumor markers: Secondary | ICD-10-CM | POA: Diagnosis not present

## 2024-04-09 DIAGNOSIS — C563 Malignant neoplasm of bilateral ovaries: Secondary | ICD-10-CM

## 2024-04-09 DIAGNOSIS — Z5111 Encounter for antineoplastic chemotherapy: Secondary | ICD-10-CM | POA: Insufficient documentation

## 2024-04-09 DIAGNOSIS — C7989 Secondary malignant neoplasm of other specified sites: Secondary | ICD-10-CM | POA: Insufficient documentation

## 2024-04-09 DIAGNOSIS — Z801 Family history of malignant neoplasm of trachea, bronchus and lung: Secondary | ICD-10-CM | POA: Diagnosis not present

## 2024-04-09 DIAGNOSIS — C569 Malignant neoplasm of unspecified ovary: Secondary | ICD-10-CM

## 2024-04-09 DIAGNOSIS — Z90722 Acquired absence of ovaries, bilateral: Secondary | ICD-10-CM | POA: Diagnosis not present

## 2024-04-09 DIAGNOSIS — R809 Proteinuria, unspecified: Secondary | ICD-10-CM

## 2024-04-09 DIAGNOSIS — N1831 Chronic kidney disease, stage 3a: Secondary | ICD-10-CM | POA: Diagnosis not present

## 2024-04-09 DIAGNOSIS — K5900 Constipation, unspecified: Secondary | ICD-10-CM | POA: Diagnosis not present

## 2024-04-09 DIAGNOSIS — R971 Elevated cancer antigen 125 [CA 125]: Secondary | ICD-10-CM | POA: Insufficient documentation

## 2024-04-09 LAB — CBC WITH DIFFERENTIAL (CANCER CENTER ONLY)
Abs Immature Granulocytes: 0.02 K/uL (ref 0.00–0.07)
Basophils Absolute: 0 K/uL (ref 0.0–0.1)
Basophils Relative: 1 %
Eosinophils Absolute: 0.1 K/uL (ref 0.0–0.5)
Eosinophils Relative: 1 %
HCT: 34.6 % — ABNORMAL LOW (ref 36.0–46.0)
Hemoglobin: 11.2 g/dL — ABNORMAL LOW (ref 12.0–15.0)
Immature Granulocytes: 0 %
Lymphocytes Relative: 46 %
Lymphs Abs: 2.5 K/uL (ref 0.7–4.0)
MCH: 32.6 pg (ref 26.0–34.0)
MCHC: 32.4 g/dL (ref 30.0–36.0)
MCV: 100.6 fL — ABNORMAL HIGH (ref 80.0–100.0)
Monocytes Absolute: 0.8 K/uL (ref 0.1–1.0)
Monocytes Relative: 14 %
Neutro Abs: 2.1 K/uL (ref 1.7–7.7)
Neutrophils Relative %: 38 %
Platelet Count: 163 K/uL (ref 150–400)
RBC: 3.44 MIL/uL — ABNORMAL LOW (ref 3.87–5.11)
RDW: 14.5 % (ref 11.5–15.5)
WBC Count: 5.4 K/uL (ref 4.0–10.5)
nRBC: 0 % (ref 0.0–0.2)

## 2024-04-09 LAB — CMP (CANCER CENTER ONLY)
ALT: 30 U/L (ref 0–44)
AST: 29 U/L (ref 15–41)
Albumin: 4 g/dL (ref 3.5–5.0)
Alkaline Phosphatase: 118 U/L (ref 38–126)
Anion gap: 12 (ref 5–15)
BUN: 27 mg/dL — ABNORMAL HIGH (ref 8–23)
CO2: 22 mmol/L (ref 22–32)
Calcium: 9.5 mg/dL (ref 8.9–10.3)
Chloride: 100 mmol/L (ref 98–111)
Creatinine: 1.22 mg/dL — ABNORMAL HIGH (ref 0.44–1.00)
GFR, Estimated: 47 mL/min — ABNORMAL LOW (ref >60)
Glucose, Bld: 111 mg/dL — ABNORMAL HIGH (ref 70–99)
Potassium: 4.4 mmol/L (ref 3.5–5.1)
Sodium: 134 mmol/L — ABNORMAL LOW (ref 135–145)
Total Bilirubin: 0.2 mg/dL (ref 0.0–1.2)
Total Protein: 6.6 g/dL (ref 6.5–8.1)

## 2024-04-09 LAB — TOTAL PROTEIN, URINE DIPSTICK: Protein, ur: 30 mg/dL — AB

## 2024-04-09 MED ORDER — SODIUM CHLORIDE 0.9 % IV SOLN
INTRAVENOUS | Status: DC
  Filled 2024-04-09: qty 250

## 2024-04-09 MED ORDER — LOSARTAN POTASSIUM 50 MG PO TABS
50.0000 mg | ORAL_TABLET | Freq: Every day | ORAL | 1 refills | Status: DC
  Filled 2024-04-09 (×2): qty 30, 30d supply, fill #0
  Filled 2024-05-03: qty 30, 30d supply, fill #1

## 2024-04-09 MED ORDER — BEVACIZUMAB-BVZR CHEMO INJECTION 400 MG/16ML
15.0000 mg/kg | Freq: Once | INTRAVENOUS | Status: AC
  Administered 2024-04-09: 1100 mg via INTRAVENOUS
  Filled 2024-04-09: qty 32

## 2024-04-09 NOTE — Assessment & Plan Note (Addendum)
 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  Elevated CA 125 609, elevated CA 27.29 -415  -Mychoice HRD positive  S/p neoadjuvant carboplatin /Taxol  x 4 [Bevacizumab  was not given in neoadjuvant setting due to tumor abutting bowel wall]  S/p  debulking surgery.  S/p additional 3 cycles of chemotherapy. With Bevacizumab  added for cycle 6. Bevacizumab  was held for cycle 7 due to proteinuria Labs are reviewed and discussed with patient. Proceed maintenance Bevacizumab  and Olarparib 200mg  BID.  Rationale and side effects were reviewed with patient.  Repeat labs in 2 weeks. Follow up in 4 weeks

## 2024-04-09 NOTE — Progress Notes (Signed)
 Clinical Pharmacist Practitioner Clinic [facility]  [phone] [phone]  Patient Care Team: Bair, Kalpana, MD as PCP - General (Family Medicine) Maurie Rayfield BIRCH, RN as Oncology Nurse Navigator Babara Call, MD as Consulting Physician (Oncology)   Name of the patient: Beth Hart  [id number]  [birth year]   Date of visit: 04/09/2024   HPI: Patient is a 73 y.o. female with HRD positive ovarian cancer.  Planned maintenance therapy with Lynparza  (olaparib ) and bevacizumab . Planned duration of 2 years or until unacceptable toxicity, disease progression.  Patient will start olaparib  today 04/09/2024.  Reason for Consult: Olaparib  oral chemotherapy education.   PAST MEDICAL HISTORY: Past Medical History:  Diagnosis Date   Anemia    Arthritis    Asthma    as a child   Cataract 2019   Chicken pox    Chronic kidney disease 2015   Colon polyps 2012   Depression    Hemorrhoids    Ovarian cancer [facility])     HEMATOLOGY/ONCOLOGY HISTORY:  Oncology History  Peritoneal carcinomatosis (HCC)  09/18/2023 Initial Diagnosis   Peritoneal carcinomatosis (HCC)   10/10/2023 - 03/19/2024 Chemotherapy   Patient is on Treatment Plan : OVARIAN Carboplatin   + Paclitaxel  ) q21d      Genetic Testing   Negative genetic testing. No pathogenic variants identified on the [employer] CancerNext+RNA panel. The report date is 10/20/2023.  The [facility] CancerNext+RNAinsight Panel includes sequencing, rearrangement analysis, and RNA analysis for the following 40 genes: APC, ATM, BAP1, BARD1, BMPR1A, BRCA1, BRCA2, BRIP1, CDH1, CDKN2A, CHEK2, FH, FLCN, MET, MLH1, MSH2, MSH6, MUTYH, NF1, NTHL1, PALB2, PMS2, PTEN, RAD51C, RAD51D, RPS20, SMAD4, STK11, TP53, TSC1, TSC2, and VHL (sequencing and deletion/duplication); AXIN2, HOXB13, MBD4, MSH3, POLD1 and POLE (sequencing only); EPCAM and GREM1 (deletion/duplication only).   04/09/2024 -  Chemotherapy   Patient is on Treatment Plan :  Bevacizumab  q21d     Ovarian cancer (HCC)  09/25/2023 Cancer Staging   Staging form: Ovary, Fallopian Tube, and Primary Peritoneal Carcinoma, AJCC 8th Edition - Clinical stage from 09/25/2023: FIGO Stage IV (cT3c, cN1b, cM1) - Signed by Babara Call, MD on 10/05/2023 Stage prefix: Initial diagnosis   10/04/2023 Initial Diagnosis   Ovarian cancer  She experiences pressure and sharp pain in her abdomen, initially attributing it to a pulled muscle from yard work. The pain persisted and was accompanied by a fever, prompting her to visit the ER on 09/16/2023. The fever was around 100F at home and remained at that level upon arrival at the ER. She has a persistent cough, especially when lying down.   09/16/2023, CT abdomen pelvis with contrast showed extensive omental and peritoneal carcinomatosis with small to moderate ascites. multiple hypoattenuating masses in the liver and spleen, compatible with metastases. There are also multiple mildly enlarged retroperitoneal lymph nodes, also highly concerning for metastases.  09/24/2023, cytology from peritoneal ascites showed negative for malignancy. 09/25/2023, endometrial biopsy showed no malignancy. 09/26/2023 Patient underwent peritoneal mass biopsy,  1. Peritoneum, biopsy, RUQ mass :       - METASTATIC HIGH-GRADE SEROUS CARCINOMA WITH NECROSIS, SEE NOTE   Diagnosis Note : Immunohistochemical stains show that the tumor cells are positive for PAX8, p16 and p53 (clonal overexpression pattern) while they are negative for p63 and CK5/6.  This immunoprofile is consistent with above interpretation.    Tumor Testing: [facility] My[facility] shows HRD but no somatic BRCA mutation Genomic instability  score of 43.   10/10/2023 - 03/19/2024 Chemotherapy   Patient is on Treatment  Plan : OVARIAN Carboplatin   + Paclitaxel  ) q21d      Imaging   CT chest w contrast  Several scattered sub 5 mm bilateral lung nodules identified. Based on appearance and other findings these are  worrisome for early lung metastases. Few enlarged pre cardiophrenic mediastinal nodes as well as left internal mammary chain lymph nodes also worrisome for spread of disease.Tiny left pleural effusion.   Please correlate with previous abdomen pelvis CT describing abdominal findings.      Genetic Testing   Negative genetic testing. No pathogenic variants identified on the [employer] CancerNext+RNA panel. The report date is 10/20/2023.  The [employer] CancerNext+RNAinsight Panel includes sequencing, rearrangement analysis, and RNA analysis for the following 40 genes: APC, ATM, BAP1, BARD1, BMPR1A, BRCA1, BRCA2, BRIP1, CDH1, CDKN2A, CHEK2, FH, FLCN, MET, MLH1, MSH2, MSH6, MUTYH, NF1, NTHL1, PALB2, PMS2, PTEN, RAD51C, RAD51D, RPS20, SMAD4, STK11, TP53, TSC1, TSC2, and VHL (sequencing and deletion/duplication); AXIN2, HOXB13, MBD4, MSH3, POLD1 and POLE (sequencing only); EPCAM and GREM1 (deletion/duplication only).   01/07/2024 Surgery   Debulking surgery  Pathology showed  A. Omentum, omentectomy:   High-grade serous carcinoma (7.5 cm).     B. Left fallopian tube and ovary, left salpingo-oophorectomy:   High-grade serous carcinoma with surface involvement of the ovary. Benign fallopian tube.     C. Right fallopian tube and ovary, right salpingo-oophorectomy:   High-grade serous carcinoma with surface involvement of the ovary and fallopian tube.     D. Uterus and cervix, hysterectomy:   Uterus (39 grams): Endometrium: Inactive. Myometrium: Benign.  Cervix: Benign. Serosa: High-grade serous carcinoma.  TUMOR    Tumor Site:    Bilateral ovaries     Tumor Size:    Greatest Dimension (Centimeters): 1.7 cm     Histologic Type:    High-grade serous carcinoma     Ovarian Surface Involvement:    Present, right and left     Fallopian Tube Surface Involvement:    Present, right     Other Tissue / Organ Involvement:    Right ovary     Other Tissue / Organ Involvement:    Left ovary     Other Tissue  / Organ Involvement:    Right fallopian tube     Other Tissue / Organ Involvement:    Omentum     Other Tissue / Organ Involvement:    uterine serosa     Largest Extrapelvic Peritoneal Focus:    Macroscopic (greater than 2 cm): Omentum     Peritoneal / Ascitic Fluid Involvement:    Not submitted / unknown     Chemotherapy Response Score (CRS):    CRS1 (no definite or minimal response)   REGIONAL LYMPH NODES     Regional Lymph Node Status:    Not applicable (no regional lymph nodes submitted or found)   pTNM CLASSIFICATION (AJCC 8th Edition)     Reporting of pT, pN, and (when applicable) pM categories is based on information available to the pathologist at the time the report is issued. As per the AJCC (Chapter 1, 8th Ed.) it is the managing physician's responsibility to establish the final pathologic stage based upon all pertinent information, including but potentially not limited to this pathology report.     Modified Classification:    y     pT Category:    pT3c     pN Category:    pN not assigned (no nodes submitted or found)      04/09/2024 -  Chemotherapy  Patient is on Treatment Plan : Bevacizumab  q21d       ALLERGIES:  has no known allergies.  MEDICATIONS:  Current Outpatient Medications  Medication Sig Dispense Refill   citalopram  (CELEXA ) 40 MG tablet Take 1 tablet (40 mg total) by mouth daily. 30 tablet 3   lactulose  (CHRONULAC ) 10 GM/15ML solution take15 to 30 ml 3 times a day when needed for constipation 236 mL 0   losartan (COZAAR) 50 MG tablet Take 1 tablet (50 mg total) by mouth daily. 30 tablet 1   olaparib  (LYNPARZA ) 100 MG tablet Take 2 tablets (200 mg total) by mouth 2 (two) times daily. May take with food to decrease nausea and vomiting. 120 tablet 1   oxyCODONE  (OXY IR/ROXICODONE ) 5 MG immediate release tablet Take 1 tablet (5 mg total) by mouth every 8 (eight) hours as needed for severe pain (pain score 7-10). 30 tablet 0   polyethylene glycol powder  (GLYCOLAX /MIRALAX ) 17 GM/SCOOP powder Take by mouth once.     senna (SENOKOT) 8.6 MG TABS tablet Take 2 tablets (17.2 mg total) by mouth daily. 120 tablet 0   No current facility-administered medications for this visit.   Facility-Administered Medications Ordered in Other Visits  Medication Dose Route Frequency Provider Last Rate Last Admin   0.9 %  sodium chloride  infusion   Intravenous Continuous Babara Call, MD 10 mL/hr at 04/09/24 0938 New Bag at 04/09/24 0938   bevacizumab -bvzr (ZIRABEV ) 1,100 mg in sodium chloride  0.9 % 100 mL chemo infusion  15 mg/kg (Treatment Plan Recorded) Intravenous Once Zackary Sydell DEL, RN       heparin  lock flush 100 unit/mL  500 Units Intravenous Once Yu, Zhou, MD        VITAL SIGNS: There were no vitals taken for this visit. There were no vitals filed for this visit.  Estimated body mass index is 30.62 kg/m as calculated from the following:   Height as of 11/07/23: 5' 2 (1.575 m).   Weight as of an earlier encounter on 04/09/24: 75.9 kg (167 lb 6.4 oz).  LABS: CBC:    Component Value Date/Time   WBC 5.4 04/09/2024 0829   WBC 8.7 09/26/2023 1041   HGB 11.2 (L) 04/09/2024 0829   HCT 34.6 (L) 04/09/2024 0829   PLT 163 04/09/2024 0829   MCV 100.6 (H) 04/09/2024 0829   NEUTROABS 2.1 04/09/2024 0829   NEUTROABS 3,830 06/12/2018 0000   LYMPHSABS 2.5 04/09/2024 0829   MONOABS 0.8 04/09/2024 0829   EOSABS 0.1 04/09/2024 0829   BASOSABS 0.0 04/09/2024 0829   Comprehensive Metabolic Panel:    Component Value Date/Time   NA 134 (L) 04/09/2024 0829   NA 140 12/18/2018 0000   K 4.4 04/09/2024 0829   CL 100 04/09/2024 0829   CO2 22 04/09/2024 0829   BUN 27 (H) 04/09/2024 0829   BUN 20 12/18/2018 0000   CREATININE 1.22 (H) 04/09/2024 0829   GLUCOSE 111 (H) 04/09/2024 0829   CALCIUM 9.5 04/09/2024 0829   AST 29 04/09/2024 0829   ALT 30 04/09/2024 0829   ALKPHOS 118 04/09/2024 0829   BILITOT 0.2 04/09/2024 0829   PROT 6.6 04/09/2024 0829   ALBUMIN  4.0 04/09/2024 0829     Present during today's visit: Patient and husband, seen in infusion  Start plan: Patient will start olaparib  today 04/09/2024   Patient Education I spoke with patient for overview of new oral chemotherapy medication: Olaparib   Treatment goal: Control  Administration: Counseled patient on administration, dosing, side  effects, monitoring, drug-food interactions, safe handling, storage, and disposal. Patient will take 2 tablets (200 mg total) by mouth 2 (two) times daily. May take with food to decrease nausea and vomiting.   Side Effects: Side effects include but not limited to: Diarrhea, nausea, fatigue, decreased hemoglobin. Diarrhea: patient knows to use loperamide as needed and call the office if they are having four or more loose stool per day Nausea: patient reports having antiemetics at home.  She knows that if she is having nausea consistently she can start taking her antiemetics as premedication prior to her olaparib   Drug-drug Interactions (DDI): No current interactions with olaparib   Adherence: After discussion with patient no patient barriers to medication adherence identified.  Reviewed with patient importance of keeping a medication schedule and plan for any missed doses.  Distress evaluation: Distress thermometer not completed during in person visit as patient has been on previous lines of therapy.   Communication and Learning Assessment Primary learner: patient Barriers to learning: No barriers Preferred language: English Learning preferences: Listening Reading  Ms. Toft voiced understanding and appreciation. All questions answered. Medication handout provided.  Provided patient with Oral Chemotherapy Navigation Clinic phone number. Patient knows to call the office with questions or concerns. Oral Chemotherapy Navigation Clinic will continue to follow.  Patient expressed understanding and was in agreement with this plan. She also  understands that She can call clinic at any time with any questions, concerns, or complaints.   Medication Access Issues: No issue, patient has medication on hand, patient fills at [facility] (Specialty)  Follow-up plan: Return to clinic as scheduled  Thank you for allowing me to participate in the care of this patient.   Time Total: 20 minutes  Visit consisted of counseling and education on dealing with issues of symptom management in the setting of serious and potentially life-threatening illness.Greater than 50%  of this time was spent counseling and coordinating care related to the above assessment and plan.  Signed by: Quill Grinder N. Arvis Zwahlen, PharmD, NEILA, CPP Hematology/Oncology Clinical Pharmacist Practitioner [place] [phone]  04/09/2024 10:03 AM

## 2024-04-09 NOTE — Assessment & Plan Note (Signed)
 Exacerbated by Bevacizumab .  Discussed with Dr. Dennise. Will start her on Losartan 50mg  daily, with close monitor of BP

## 2024-04-09 NOTE — Assessment & Plan Note (Signed)
 Grade 1/2  Monitor.

## 2024-04-09 NOTE — Patient Instructions (Signed)
 [facility] - A DEPT OF [facility]  Discharge Instructions: Thank you for choosing [facility] to provide your oncology and hematology care.  If you have a lab appointment with the Cancer Center, please go directly to the Cancer Center and check in at the registration area.  Wear comfortable clothing and clothing appropriate for easy access to any Portacath or PICC line.   We strive to give you quality time with your provider. You may need to reschedule your appointment if you arrive late (15 or more minutes).  Arriving late affects you and other patients whose appointments are after yours.  Also, if you miss three or more appointments without notifying the office, you may be dismissed from the clinic at the providers discretion.      For prescription refill requests, have your pharmacy contact our office and allow 72 hours for refills to be completed.    Today you received the following chemotherapy and/or immunotherapy agents Bevacizumab -bvzr      To help prevent nausea and vomiting after your treatment, we encourage you to take your nausea medication as directed.  BELOW ARE SYMPTOMS THAT SHOULD BE REPORTED IMMEDIATELY: *FEVER GREATER THAN 100.4 F (38 C) OR HIGHER *CHILLS OR SWEATING *NAUSEA AND VOMITING THAT IS NOT CONTROLLED WITH YOUR NAUSEA MEDICATION *UNUSUAL SHORTNESS OF BREATH *UNUSUAL BRUISING OR BLEEDING *URINARY PROBLEMS (pain or burning when urinating, or frequent urination) *BOWEL PROBLEMS (unusual diarrhea, constipation, pain near the anus) TENDERNESS IN MOUTH AND THROAT WITH OR WITHOUT PRESENCE OF ULCERS (sore throat, sores in mouth, or a toothache) UNUSUAL RASH, SWELLING OR PAIN  UNUSUAL VAGINAL DISCHARGE OR ITCHING   Items with * indicate a potential emergency and should be followed up as soon as possible or go to the Emergency Department if any problems should occur.  Please show the CHEMOTHERAPY ALERT CARD or  IMMUNOTHERAPY ALERT CARD at check-in to the Emergency Department and triage nurse.  Should you have questions after your visit or need to cancel or reschedule your appointment, please contact [facility] - A DEPT OF [facility]  [phone] and follow the prompts.  Office hours are 8:00 a.m. to 4:30 p.m. Monday - Friday. Please note that voicemails left after 4:00 p.m. may not be returned until the following business day.  We are closed weekends and major holidays. You have access to a nurse at all times for urgent questions. Please call the main number to the clinic [phone] and follow the prompts.  For any non-urgent questions, you may also contact your provider using MyChart. We now offer e-Visits for anyone 93 and older to request care online for non-urgent symptoms. For details visit [email or web address].   Also download the MyChart app! Go to the app store, search MyChart, open the app, select [place], and log in with your MyChart username and password.

## 2024-04-09 NOTE — Telephone Encounter (Signed)
 Per Dr. Babara, she has disscussed with Dr. Dennise and she has sent in Losartan 50 mg daily. Pt is to monitor BP prior to taking and if SBP is <100, take 1/2 tab only. Spoke to pt and informed her of this. Pt verbalized undetrstanding.

## 2024-04-09 NOTE — Assessment & Plan Note (Addendum)
 She currently takes Sennkot 2 tabs daily, not using Laclulose 10-20 g 2-3 times daily.  She understands the potential diarrhea side effects of Olarparib and knows to hold off bowel regimen if indicated.

## 2024-04-09 NOTE — Assessment & Plan Note (Signed)
 Encouraged oral hydration, avoid nephrotoxins.

## 2024-04-09 NOTE — Progress Notes (Signed)
 Hematology/Oncology Progress note [phone] [phone]    CHIEF COMPLAINTS/REASON FOR VISIT:  High-grade serous carcinoma of ovaries. peritoneal carcinomatosis, liver lesion.   ASSESSMENT & PLAN:    Cancer Staging  Ovarian cancer 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  Elevated CA 125 609, elevated CA 27.29 -415  -Mychoice HRD positive  S/p neoadjuvant carboplatin /Taxol  x 4 [Bevacizumab  was not given in neoadjuvant setting due to tumor abutting bowel wall]  S/p  debulking surgery.  S/p additional 3 cycles of chemotherapy. With Bevacizumab  added for cycle 6. Bevacizumab  was held for cycle 7 due to proteinuria Labs are reviewed and discussed with patient. Proceed maintenance Bevacizumab  and Olarparib 200mg  BID.  Rationale and side effects were reviewed with patient.  Repeat labs in 2 weeks. Follow up in 4 weeks   Chemotherapy-induced neuropathy Grade 1/2  Monitor.   CKD (chronic kidney disease) stage 3, GFR 30-59 ml/min (HCC) Encouraged oral hydration, avoid nephrotoxins.   Constipation She currently takes [place] 2 tabs daily, not using Laclulose 10-20 g 2-3 times daily.  She understands the potential diarrhea side effects of Olarparib and knows to hold off bowel regimen if indicated.   Proteinuria Exacerbated by Bevacizumab .  Discussed with Dr. Dennise. Will start her on Losartan 50mg  daily, with close monitor of BP  Orders Placed This Encounter  Procedures   CMP (Cancer Center only)    Standing Status:   Future    Expected Date:   05/07/2024    Expiration Date:   05/07/2025   CA 125    Standing Status:   Future    Expected Date:   05/07/2024    Expiration Date:    05/07/2025   CBC with Differential (Cancer Center Only)    Standing Status:   Future    Expected Date:   05/07/2024    Expiration Date:   05/07/2025   Follow-up 3 weeks.   All questions were answered. The patient knows to call the clinic with any problems, questions or concerns.  Call Babara, MD, PhD [facility] Hematology Oncology 04/09/2024   HISTORY OF PRESENTING ILLNESS:   Beth Hart is a  73 y.o.  female with PMH listed below presents for follow up of stage IV ovarian high grade serous carcinoma.  Oncology History  Peritoneal carcinomatosis (HCC)  09/18/2023 Initial Diagnosis   Peritoneal carcinomatosis (HCC)   10/10/2023 - 03/19/2024 Chemotherapy   Patient is on Treatment Plan : OVARIAN Carboplatin   + Paclitaxel  ) q21d      Genetic Testing   Negative genetic testing. No pathogenic variants identified on the [facility] CancerNext+RNA panel. The report date is 10/20/2023.  The [facility] CancerNext+RNAinsight Panel includes sequencing, rearrangement analysis, and RNA analysis for the following 40 genes: APC, ATM, BAP1, BARD1, BMPR1A, BRCA1, BRCA2, BRIP1, CDH1, CDKN2A, CHEK2, FH, FLCN, MET, MLH1, MSH2, MSH6, MUTYH, NF1, NTHL1, PALB2, PMS2, PTEN, RAD51C, RAD51D, RPS20, SMAD4, STK11, TP53, TSC1, TSC2, and VHL (sequencing and deletion/duplication); AXIN2, HOXB13, MBD4, MSH3, POLD1 and POLE (sequencing only); EPCAM and GREM1 (deletion/duplication only).   04/09/2024 -  Chemotherapy   Patient is on Treatment Plan : Bevacizumab  q21d     Ovarian cancer (HCC)  09/25/2023 Cancer Staging   Staging form: Ovary, Fallopian Tube, and Primary Peritoneal  Carcinoma, AJCC 8th Edition - Clinical stage from 09/25/2023: FIGO Stage IV (cT3c, cN1b, cM1) - Signed by Babara Call, MD on 10/05/2023 Stage prefix: Initial diagnosis   10/04/2023 Initial Diagnosis   Ovarian cancer  She experiences pressure and sharp pain in her abdomen, initially attributing it to a pulled muscle from yard work. The pain persisted and was  accompanied by a fever, prompting her to visit the ER on 09/16/2023. The fever was around 100F at home and remained at that level upon arrival at the ER. She has a persistent cough, especially when lying down.   09/16/2023, CT abdomen pelvis with contrast showed extensive omental and peritoneal carcinomatosis with small to moderate ascites. multiple hypoattenuating masses in the liver and spleen, compatible with metastases. There are also multiple mildly enlarged retroperitoneal lymph nodes, also highly concerning for metastases.  09/24/2023, cytology from peritoneal ascites showed negative for malignancy. 09/25/2023, endometrial biopsy showed no malignancy. 09/26/2023 Patient underwent peritoneal mass biopsy,  1. Peritoneum, biopsy, RUQ mass :       - METASTATIC HIGH-GRADE SEROUS CARCINOMA WITH NECROSIS, SEE NOTE   Diagnosis Note : Immunohistochemical stains show that the tumor cells are positive for PAX8, p16 and p53 (clonal overexpression pattern) while they are negative for p63 and CK5/6.  This immunoprofile is consistent with above interpretation.    Tumor Testing: [facility] shows HRD but no somatic BRCA mutation Genomic instability  score of 43.   10/10/2023 - 03/19/2024 Chemotherapy   Patient is on Treatment Plan : OVARIAN Carboplatin   + Paclitaxel  ) q21d      Imaging   CT chest w contrast  Several scattered sub 5 mm bilateral lung nodules identified. Based on appearance and other findings these are worrisome for early lung metastases. Few enlarged pre cardiophrenic mediastinal nodes as well as left internal mammary chain lymph nodes also worrisome for spread of disease.Tiny left pleural effusion.   Please correlate with previous abdomen pelvis CT describing abdominal findings.      Genetic Testing   Negative genetic testing. No pathogenic variants identified on the [facility] CancerNext+RNA panel. The report date is 10/20/2023.  The [facility] CancerNext+RNAinsight Panel includes  sequencing, rearrangement analysis, and RNA analysis for the following 40 genes: APC, ATM, BAP1, BARD1, BMPR1A, BRCA1, BRCA2, BRIP1, CDH1, CDKN2A, CHEK2, FH, FLCN, MET, MLH1, MSH2, MSH6, MUTYH, NF1, NTHL1, PALB2, PMS2, PTEN, RAD51C, RAD51D, RPS20, SMAD4, STK11, TP53, TSC1, TSC2, and VHL (sequencing and deletion/duplication); AXIN2, HOXB13, MBD4, MSH3, POLD1 and POLE (sequencing only); EPCAM and GREM1 (deletion/duplication only).   01/07/2024 Surgery   Debulking surgery  Pathology showed  A. Omentum, omentectomy:   High-grade serous carcinoma (7.5 cm).     B. Left fallopian tube and ovary, left salpingo-oophorectomy:   High-grade serous carcinoma with surface involvement of the ovary. Benign fallopian tube.     C. Right fallopian tube and ovary, right salpingo-oophorectomy:   High-grade serous carcinoma with surface involvement of the ovary and fallopian tube.     D. Uterus and cervix, hysterectomy:   Uterus (39 grams): Endometrium: Inactive. Myometrium: Benign.  Cervix: Benign. Serosa: High-grade serous carcinoma.  TUMOR    Tumor Site:    Bilateral ovaries     Tumor Size:    Greatest Dimension (Centimeters): 1.7 cm     Histologic Type:    High-grade serous carcinoma     Ovarian Surface Involvement:    Present, right and left     Fallopian Tube Surface Involvement:    Present, right  Other Tissue / Organ Involvement:    Right ovary     Other Tissue / Organ Involvement:    Left ovary     Other Tissue / Organ Involvement:    Right fallopian tube     Other Tissue / Organ Involvement:    Omentum     Other Tissue / Organ Involvement:    uterine serosa     Largest Extrapelvic Peritoneal Focus:    Macroscopic (greater than 2 cm): Omentum     Peritoneal / Ascitic Fluid Involvement:    Not submitted / unknown     Chemotherapy Response Score (CRS):    CRS1 (no definite or minimal response)   REGIONAL LYMPH NODES     Regional Lymph Node Status:    Not applicable (no regional lymph  nodes submitted or found)   pTNM CLASSIFICATION (AJCC 8th Edition)     Reporting of pT, pN, and (when applicable) pM categories is based on information available to the pathologist at the time the report is issued. As per the AJCC (Chapter 1, 8th Ed.) it is the managing physician's responsibility to establish the final pathologic stage based upon all pertinent information, including but potentially not limited to this pathology report.     Modified Classification:    y     pT Category:    pT3c     pN Category:    pN not assigned (no nodes submitted or found)      04/09/2024 -  Chemotherapy   Patient is on Treatment Plan : Bevacizumab  q21d      Her family history includes father who had lung cancer attributed to smoking, but no history of colon, ovarian, or breast cancer. Discussed the use of AI scribe software for clinical note transcription with the patient, who gave verbal consent to proceed.    She underwent a colonoscopy a few years ago, which was reported to be negative.  And a mammogram six months ago, which was negative she does not recall any recent stomach pain, dysphagia, or acid reflux.  INTERVAL HISTORY KANDEE ESCALANTE is a 73 y.o. female who has above history reviewed by me today presents for follow up visit for Stage IV ovarian cancer.   Today she has no nausea vomiting abdominal pain. BP was high after last visit with SBP as 190s. Today BP 146/86 Constipation is manageable with current bowel regimen.   MEDICAL HISTORY:  Past Medical History:  Diagnosis Date   Anemia    Arthritis    Asthma    as a child   Cataract 2019   Chicken pox    Chronic kidney disease 2015   Colon polyps 2012   Depression    Hemorrhoids    Ovarian cancer (HCC)     SURGICAL HISTORY: Past Surgical History:  Procedure Laterality Date   ABDOMINAL HYSTERECTOMY     BREAST BIOPSY  03/09/2024   RIGHT BREAST STEREO CALCS-RIBBON MARKER-PATH PENDING   BREAST BIOPSY Right 03/09/2024   MM RT  BREAST BX W LOC DEV 1ST LESION IMAGE BX SPEC STEREO GUIDE 03/09/2024 [facility]   BREAST CYST ASPIRATION Left 90s   BREAST SURGERY  2007   aspiration   CARPAL TUNNEL RELEASE  2010   REFRACTIVE SURGERY     TONSILLECTOMY      SOCIAL HISTORY: Social History   Socioeconomic History   Marital status: Married    Spouse name: Not on file   Number of children: Not on file   Years of  education: Not on file   Highest education level: Associate degree: occupational, scientist, product/process development, or vocational program  Occupational History   Not on file  Tobacco Use   Smoking status: Never   Smokeless tobacco: Never  Vaping Use   Vaping status: Never Used  Substance and Sexual Activity   Alcohol use: No   Drug use: No   Sexual activity: Yes  Other Topics Concern   Not on file  Social History Narrative   married   Social Drivers of Health   Tobacco Use: Low Risk (04/09/2024)   Patient History    Smoking Tobacco Use: Never    Smokeless Tobacco Use: Never    Passive Exposure: Not on file  Financial Resource Strain: Low Risk (02/07/2024)   Overall Financial Resource Strain (CARDIA)    Difficulty of Paying Living Expenses: Not hard at all  Food Insecurity: No Food Insecurity (02/07/2024)   Epic    Worried About Programme Researcher, Broadcasting/film/video in the Last Year: Never true    Ran Out of Food in the Last Year: Never true  Transportation Needs: No Transportation Needs (02/07/2024)   Epic    Lack of Transportation (Medical): No    Lack of Transportation (Non-Medical): No  Physical Activity: Inactive (02/07/2024)   Exercise Vital Sign    Days of Exercise per Week: 0 days    Minutes of Exercise per Session: Not on file  Stress: No Stress Concern Present (02/07/2024)   [employer] of Occupational Health - Occupational Stress Questionnaire    Feeling of Stress: Not at all  Social Connections: Moderately Integrated (02/07/2024)   Social Connection and Isolation Panel    Frequency of Communication with  Friends and Family: Once a week    Frequency of Social Gatherings with Friends and Family: Once a week    Attends Religious Services: More than 4 times per year    Active Member of [employer] or Organizations: Yes    Attends Banker Meetings: More than 4 times per year    Marital Status: Married  Catering Manager Violence: Not At Risk (09/25/2023)   Humiliation, Afraid, Rape, and Kick questionnaire    Fear of Current or Ex-Partner: No    Emotionally Abused: No    Physically Abused: No    Sexually Abused: No  Depression (PHQ2-9): Low Risk (02/10/2024)   Depression (PHQ2-9)    PHQ-2 Score: 2  Alcohol Screen: Low Risk (06/03/2023)   Alcohol Screen    Last Alcohol Screening Score (AUDIT): 0  Housing: Low Risk (02/07/2024)   Epic    Unable to Pay for Housing in the Last Year: No    Number of Times Moved in the Last Year: 0    Homeless in the Last Year: No  Utilities: Not At Risk (01/07/2024)   Received from [facility]   Epic    In the past 12 months has the electric, gas, oil, or water company threatened to shut off services in your home?: No  Health Literacy: Adequate Health Literacy (06/03/2023)   B1300 Health Literacy    Frequency of need for help with medical instructions: Never    FAMILY HISTORY: Family History  Problem Relation Age of Onset   Alzheimer's disease Mother    Depression Mother    Vision loss Mother    Lung cancer Father    Cancer Father    Prostate cancer Maternal Uncle    Prostate cancer Maternal Uncle    Prostate cancer Maternal Uncle  Prostate cancer Maternal Uncle    Prostate cancer Maternal Uncle    Prostate cancer Other        in mat great uncles, unk#   Prostate cancer Cousin        in 4-5 mat  first cousins   Breast cancer Neg Hx     ALLERGIES:  has no known allergies.  MEDICATIONS:  Current Outpatient Medications  Medication Sig Dispense Refill   citalopram  (CELEXA ) 40 MG tablet Take 1 tablet (40 mg total) by mouth  daily. 30 tablet 3   lactulose  (CHRONULAC ) 10 GM/15ML solution take15 to 30 ml 3 times a day when needed for constipation 236 mL 0   losartan (COZAAR) 50 MG tablet Take 1 tablet (50 mg total) by mouth daily. 30 tablet 1   olaparib  (LYNPARZA ) 100 MG tablet Take 2 tablets (200 mg total) by mouth 2 (two) times daily. May take with food to decrease nausea and vomiting. 120 tablet 1   oxyCODONE  (OXY IR/ROXICODONE ) 5 MG immediate release tablet Take 1 tablet (5 mg total) by mouth every 8 (eight) hours as needed for severe pain (pain score 7-10). 30 tablet 0   polyethylene glycol powder (GLYCOLAX /MIRALAX ) 17 GM/SCOOP powder Take by mouth once.     senna (SENOKOT) 8.6 MG TABS tablet Take 2 tablets (17.2 mg total) by mouth daily. 120 tablet 0   No current facility-administered medications for this visit.   Facility-Administered Medications Ordered in Other Visits  Medication Dose Route Frequency Provider Last Rate Last Admin   0.9 %  sodium chloride  infusion   Intravenous Continuous Babara Call, MD   Stopped at 04/09/24 1048   heparin  lock flush 100 unit/mL  500 Units Intravenous Once Sawyer Mentzer, MD        Review of Systems  Constitutional:  Positive for fatigue. Negative for appetite change, chills, fever and unexpected weight change.  HENT:   Negative for hearing loss and voice change.   Eyes:  Negative for eye problems.  Respiratory:  Negative for chest tightness and cough.   Cardiovascular:  Negative for chest pain.  Gastrointestinal:  Positive for constipation. Negative for abdominal distention, abdominal pain, blood in stool, nausea and vomiting.  Endocrine: Negative for hot flashes.  Genitourinary:  Negative for difficulty urinating and frequency.   Musculoskeletal:  Negative for arthralgias.  Skin:  Negative for itching and rash.  Neurological:  Negative for extremity weakness.  Hematological:  Negative for adenopathy.  Psychiatric/Behavioral:  Positive for depression and sleep disturbance.  Negative for confusion.    PHYSICAL EXAMINATION: ECOG PERFORMANCE STATUS: 1 - Symptomatic but completely ambulatory Vitals:   04/09/24 0849 04/09/24 0855  BP: (!) 156/90 (!) 146/86  Pulse:  83  Resp: 18   Temp: 99.1 F (37.3 C)     Filed Weights   04/09/24 0849  Weight: 167 lb 6.4 oz (75.9 kg)     Physical Exam Constitutional:      General: She is not in acute distress. HENT:     Head: Normocephalic and atraumatic.  Eyes:     General: No scleral icterus. Cardiovascular:     Rate and Rhythm: Normal rate and regular rhythm.     Heart sounds: Normal heart sounds.  Pulmonary:     Effort: Pulmonary effort is normal. No respiratory distress.     Breath sounds: No wheezing.  Abdominal:     General: Bowel sounds are normal. There is no distension.     Palpations: Abdomen is soft.  Musculoskeletal:  General: No deformity. Normal range of motion.     Cervical back: Normal range of motion and neck supple.  Skin:    General: Skin is warm and dry.     Findings: No erythema or rash.  Neurological:     Mental Status: She is alert and oriented to person, place, and time. Mental status is at baseline.  Psychiatric:        Mood and Affect: Mood normal.     LABORATORY DATA:  I have reviewed the data as listed    Latest Ref Rng & Units 04/09/2024    8:29 AM 03/19/2024    8:37 AM 02/27/2024    8:20 AM  CBC  WBC 4.0 - 10.5 K/uL 5.4  4.6  7.2   Hemoglobin 12.0 - 15.0 g/dL 88.7  88.6  88.5   Hematocrit 36.0 - 46.0 % 34.6  34.9  35.8   Platelets 150 - 400 K/uL 163  167  244       Latest Ref Rng & Units 04/09/2024    8:29 AM 03/19/2024    8:37 AM 02/27/2024    8:20 AM  CMP  Glucose 70 - 99 mg/dL 888  91  887   BUN 8 - 23 mg/dL 27  28  34   Creatinine 0.44 - 1.00 mg/dL 8.77  8.78  8.68   Sodium 135 - 145 mmol/L 134  139  134   Potassium 3.5 - 5.1 mmol/L 4.4  4.2  4.9   Chloride 98 - 111 mmol/L 100  106  103   CO2 22 - 32 mmol/L 22  26  22    Calcium 8.9 - 10.3  mg/dL 9.5  9.2  9.3   Total Protein 6.5 - 8.1 g/dL 6.6  6.8  7.1   Total Bilirubin 0.0 - 1.2 mg/dL 0.2  0.5  0.6   Alkaline Phos 38 - 126 U/L 118  101  99   AST 15 - 41 U/L 29  26  30    ALT 0 - 44 U/L 30  25  27        RADIOGRAPHIC STUDIES: I have personally reviewed the radiological images as listed and agreed with the findings in the report. MM RT BREAST BX W LOC DEV 1ST LESION IMAGE BX SPEC STEREO GUIDE Addendum Date: 03/11/2024 ADDENDUM REPORT: 03/11/2024 11:39 ADDENDUM: PATHOLOGY revealed: Breast, right, needle core biopsy, calcifications, 3:00 posterior depth, 3 mm (ribbon clip)- FIBROCYSTIC CHANGE WITH FIBROADENOMATOID CHANGE AND DYSTROPHIC CALCIFICATIONS- NEGATIVE FOR CARICNOMA Pathology results are CONCORDANT with imaging findings, per Dr. Norleen Croak. Pathology results and recommendations were discussed with patient via telephone on 03/10/2024 by Mliss Molt RN. Patient reported biopsy site doing well with no adverse symptoms, and only slight tenderness at the site. Post biopsy care instructions were reviewed, questions were answered and my direct phone number was provided. Patient was instructed to call [facility] for any additional questions or concerns related to biopsy site. RECOMMENDATION: Patient instructed to resume annual bilateral screening mammogram due October 2026. Pathology results reported by Mliss Molt RN 03/10/2024. Electronically Signed   By: Norleen Croak M.D.   On: 03/11/2024 11:39   Result Date: 03/11/2024 CLINICAL DATA:  Indeterminate RIGHT breast calcifications EXAM: RIGHT BREAST STEREOTACTIC CORE NEEDLE BIOPSY COMPARISON:  Previous exam(s). FINDINGS: The patient and I discussed the procedure of stereotactic-guided biopsy including benefits and alternatives. We discussed the high likelihood of a successful procedure. We discussed the risks of the procedure including infection, bleeding, tissue injury,  clip migration, and inadequate sampling. Informed  written consent was given. The usual time out protocol was performed immediately prior to the procedure. Using sterile technique and 1% lidocaine  and 1% lidocaine  with epinephrine  as local anesthetic, under stereotactic guidance, a 9 gauge vacuum assisted device was used to perform core needle biopsy of calcifications in the RIGHT breast at 3 o'clock using a MEDIAL approach. Specimen radiograph was performed showing representative calcifications. Specimens with calcifications are identified for pathology. Lesion quadrant: Upper inner At the conclusion of the procedure, ribbon shaped tissue marker clip was deployed into the biopsy cavity. Follow-up 2-view mammogram was performed and dictated separately. IMPRESSION: Stereotactic-guided biopsy of RIGHT breast calcifications at 3 o'clock posterior depth. No apparent complications. Electronically Signed: By: Norleen Croak M.D. On: 03/09/2024 09:03   MM CLIP PLACEMENT RIGHT Result Date: 03/09/2024 CLINICAL DATA:  Status post RIGHT breast stereotactic biopsy EXAM: 3D DIAGNOSTIC RIGHT MAMMOGRAM POST STEREOTACTIC BIOPSY COMPARISON:  Previous exam(s). ACR Breast Density Category c: The breasts are heterogeneously dense, which may obscure small masses. FINDINGS: 3D Mammographic images were obtained following stereotactic guided biopsy of RIGHT breast calcifications at 3 o'clock. The biopsy marking clip is in expected position at the site of biopsy. IMPRESSION: Appropriate positioning of the ribbon shaped biopsy marking clip at the site of biopsy in the RIGHT breast. Final Assessment: Post Procedure Mammograms for Marker Placement Electronically Signed   By: Norleen Croak M.D.   On: 03/09/2024 09:10   DG Abdomen 1 View Result Date: 03/02/2024 EXAM: 1 VIEW XRAY OF THE ABDOMEN 03/02/2024 08:53:00 PM COMPARISON: CT 12/02/2023. CLINICAL HISTORY: Constipation. FINDINGS: BOWEL: Large stool burden throughout the colon. Nonobstructive bowel gas pattern. SOFT TISSUES: No opaque  urinary calculi. BONES: No acute osseous abnormality. IMPRESSION: 1. Large stool burden throughout the colon. Electronically signed by: Franky Crease MD 03/02/2024 09:08 PM EST RP Workstation: [id number]   MM 3D DIAGNOSTIC MAMMOGRAM UNILATERAL RIGHT BREAST Result Date: 02/28/2024 CLINICAL DATA:  Screening recall RIGHT breast calcifications EXAM: DIGITAL DIAGNOSTIC UNILATERAL RIGHT MAMMOGRAM WITH TOMOSYNTHESIS AND CAD TECHNIQUE: Right digital diagnostic mammography and breast tomosynthesis was performed. The images were evaluated with computer-aided detection. COMPARISON:  Previous exam(s). ACR Breast Density Category c: The breasts are heterogeneously dense, which may obscure small masses. FINDINGS: Full field lateral and spot magnification view(s) were performed and demonstrate grouped coarse heterogeneous calcifications at the RIGHT breast 3 o'clock position posterior depth measuring 3 mm in diameter. No other suspicious findings in the RIGHT breast. IMPRESSION: Indeterminate RIGHT breast calcifications spanning 3 mm in the RIGHT breast 3 o'clock position posterior depth. Stereotactic biopsy is recommended. RECOMMENDATION: Stereotactic biopsy of the RIGHT breast x1 I have discussed the findings and recommendations with the patient. The biopsy procedure was discussed with the patient and questions were answered. Patient expressed their understanding of the biopsy recommendation. Patient will be scheduled for biopsy at her earliest convenience by the schedulers. Ordering provider will be notified. If applicable, a reminder letter will be sent to the patient regarding the next appointment. BI-RADS CATEGORY  4: Suspicious. Electronically Signed   By: Norleen Croak M.D.   On: 02/28/2024 11:02   MM 3D SCREENING MAMMOGRAM BILATERAL BREAST Result Date: 02/26/2024 CLINICAL DATA:  Screening. EXAM: DIGITAL SCREENING BILATERAL MAMMOGRAM WITH TOMOSYNTHESIS AND CAD TECHNIQUE: Bilateral screening digital craniocaudal and  mediolateral oblique mammograms were obtained. Bilateral screening digital breast tomosynthesis was performed. The images were evaluated with computer-aided detection. COMPARISON:  Previous exam(s). ACR Breast Density Category c: The breasts are heterogeneously dense, which  may obscure small masses. FINDINGS: In the right breast, calcifications about the slightly upper slightly inner breast, middle to posterior third, warrant further evaluation with magnified views. In the left breast, no findings suspicious for malignancy. IMPRESSION: Further evaluation is suggested for calcifications in the right breast. RECOMMENDATION: Diagnostic mammogram of the right breast. (Code:[id number]) The patient will be contacted regarding the findings, and additional imaging will be scheduled. BI-RADS CATEGORY  0: Incomplete: Need additional imaging evaluation. Electronically Signed   By: Curtistine Noble   On: 02/26/2024 14:34

## 2024-04-09 NOTE — Patient Instructions (Signed)
 [facility] - A DEPT OF [facility]    Thank you for choosing [facility] to provide your oncology/hematology care and for allowing us  to participate in your care today!  As a reminder, we spoke about the following today: Lynparza  (olaparib ) for the maintenance treatment of HRD positive ovarian cancer in conjunction with bevacizumab , planned duration until unacceptable toxicity, disease progression, or completion of 2 years of treatment.   Treatment goal: Control  Medication handout has been provided.   **For oral cancer medication prescription refill requests, contact your pharmacy and they will contact our office if needed. Allow 5-7 days for refills to be completed by your specialty pharmacy.    Cancer Center General Instructions:  If you have an appointment at the [facility], please go directly to the Cancer Center and check in at the registration area.  We strive to give you quality time with your provider. You may need to reschedule your appointment if you arrive late (15 or more minutes).  Arriving late affects you and other patients whose appointments are after yours.  Also, if you miss three or more appointments without notifying the office, you may be dismissed from the clinic at the provider's discretion.      BELOW ARE SYMPTOMS THAT SHOULD BE REPORTED IMMEDIATELY: *FEVER GREATER THAN 100.4 F (38 C) OR HIGHER *CHILLS OR SWEATING *NAUSEA AND VOMITING THAT IS NOT CONTROLLED WITH YOUR NAUSEA MEDICATION *UNUSUAL SHORTNESS OF BREATH *UNUSUAL BRUISING OR BLEEDING *URINARY PROBLEMS (pain or burning when urinating, or frequent urination) *BOWEL PROBLEMS (unusual diarrhea, constipation, pain near the anus) TENDERNESS IN MOUTH AND THROAT WITH OR WITHOUT PRESENCE OF ULCERS (sore throat, sores in mouth, or a toothache) UNUSUAL RASH, SWELLING OR PAIN  UNUSUAL VAGINAL DISCHARGE OR ITCHING   Items with * indicate a potential emergency  and should be followed up as soon as possible or go to the Emergency Department if any problems should occur.  Please show the CHEMOTHERAPY ALERT CARD at check-in to the Emergency Department and triage nurse.  Should you have questions after your visit or need to cancel or reschedule your appointment, please contact [facility] - A DEPT OF [facility]  [phone] and follow the prompts.  Office hours are 8:00 a.m. to 4:30 p.m. Monday - Friday. Please note that voicemails left after 4:00 p.m. may not be returned until the following business day.  We are closed weekends and major holidays. You have access to a nurse at all times for urgent questions. Please call the main number to the clinic [phone] and follow the prompts.  For any non-urgent questions, you may also contact your provider using MyChart. We now offer e-Visits for anyone 29 and older to request care online for non-urgent symptoms. For details visit [email or web address].   Also download the MyChart app! Go to the app store, search MyChart, open the app, select [place], and log in with your MyChart username and password.

## 2024-04-10 LAB — CA 125: Cancer Antigen (CA) 125: 40.5 U/mL — ABNORMAL HIGH (ref 0.0–38.1)

## 2024-04-13 ENCOUNTER — Other Ambulatory Visit: Payer: Self-pay

## 2024-04-14 ENCOUNTER — Other Ambulatory Visit (HOSPITAL_COMMUNITY): Payer: Self-pay

## 2024-04-15 ENCOUNTER — Other Ambulatory Visit: Payer: Self-pay

## 2024-04-15 ENCOUNTER — Inpatient Hospital Stay: Admitting: Obstetrics and Gynecology

## 2024-04-15 VITALS — BP 144/90 | HR 85 | Temp 98.6°F | Resp 20 | Wt 165.4 lb

## 2024-04-15 DIAGNOSIS — Z90722 Acquired absence of ovaries, bilateral: Secondary | ICD-10-CM | POA: Diagnosis not present

## 2024-04-15 DIAGNOSIS — C787 Secondary malignant neoplasm of liver and intrahepatic bile duct: Secondary | ICD-10-CM | POA: Diagnosis not present

## 2024-04-15 DIAGNOSIS — C786 Secondary malignant neoplasm of retroperitoneum and peritoneum: Secondary | ICD-10-CM | POA: Diagnosis not present

## 2024-04-15 DIAGNOSIS — Z801 Family history of malignant neoplasm of trachea, bronchus and lung: Secondary | ICD-10-CM

## 2024-04-15 DIAGNOSIS — C569 Malignant neoplasm of unspecified ovary: Secondary | ICD-10-CM

## 2024-04-15 DIAGNOSIS — R971 Elevated cancer antigen 125 [CA 125]: Secondary | ICD-10-CM

## 2024-04-15 DIAGNOSIS — Z5111 Encounter for antineoplastic chemotherapy: Secondary | ICD-10-CM | POA: Diagnosis not present

## 2024-04-15 DIAGNOSIS — Z90710 Acquired absence of both cervix and uterus: Secondary | ICD-10-CM | POA: Diagnosis not present

## 2024-04-15 DIAGNOSIS — R978 Other abnormal tumor markers: Secondary | ICD-10-CM

## 2024-04-15 DIAGNOSIS — C7989 Secondary malignant neoplasm of other specified sites: Secondary | ICD-10-CM

## 2024-04-15 DIAGNOSIS — Z9221 Personal history of antineoplastic chemotherapy: Secondary | ICD-10-CM

## 2024-04-15 DIAGNOSIS — R809 Proteinuria, unspecified: Secondary | ICD-10-CM | POA: Diagnosis not present

## 2024-04-15 NOTE — Progress Notes (Signed)
 Gynecologic Oncology Interval Visit   Referring Provider: Dr. [name]  Chief Concern: High grade serous ovarian cancer, stage IV  Subjective:  Beth Hart is a 73 y.o. G1P1 female who is seen in consultation from Dr. Cap who saw her at the request of Dr. Abbey for peritoneal carcinomatosis and a liver lesion.   S/p neoadjuvant carboplatin /Taxol  x 4 [Bevacizumab  was not given in neoadjuvant setting due to tumor abutting bowel wall]  S/p  debulking surgery.  S/p additional 3 cycles of chemotherapy for 7 total. With Bevacizumab  added for cycle 6. Bevacizumab  was held for cycle 7 due to proteinuria Now on maintenance Bevacizumab  and Olarparib 200mg  BID.  Just started olaparib  last week and tolerating well so far without nausea.  Gyn Oncology History 09/16/2023 She experienced persistent pressure and sharp pain in her abdomen and fever, prompting her to visit the ER.   CT abdomen pelvis with contrast showed extensive omental and peritoneal carcinomatosis with small to moderate ascites. There are multiple hypoattenuating masses in the liver and spleen, compatible with metastases. There are also multiple mildly enlarged retroperitoneal lymph nodes, also highly concerning for metastases. 2. There is mixed solid/cystic appearance of bilateral ovaries, which may be due to drop metastasis along the surface. However, correlate clinically and with tumor markers to determine the need for additional imaging with contrast-enhanced MRI pelvis. 3. There are at least 3, sub 4 mm, solid noncalcified nodules in the visualized bilateral lungs with largest measuring up to 3.4 x 4.0 mm in the right lung lower lobe, partially seen. In the given clinical context, these are also highly concerning for metastases. Consider further evaluation with dedicated chest CT and establish a baseline.   09/18/2023 Tumor markers CA125 609 09/24/2023 Paracentesis - cytology positive 09/24/23 Normal PAP and endometrial  biopsy 09/26/23 Peritoneum, biopsy, RUQ mass :  METASTATIC HIGH-GRADE SEROUS CARCINOMA WITH NECROSIS,  Immunohistochemical shows tumor cells are positive for PAX8, p16 and p53 (clonal overexpression pattern) while they are negative for p63 and CK5/6.    Neoadjuvant chemotherapy treated by Dr Cap.   She did not tolerate Carboplatin  AUC 6 with Paclitaxel  175mg /m2 x1 cycle.  On dose reduced regimen carboplatin  AUC 5, Paclitaxel  135mg /m2 x2 cycles.   CT scan 8/25 1. Interval improvement in peritoneal metastases, splenic metastases, and liver metastases. 2. Interval resolution of previously noted ascites. 3. Abdominal pelvic adenopathy, improved in the interval. 4. Interval improvement in right lower lobe lung nodules.  On 01/07/2024 after 4 cycles she had diagnostic laparoscopy, Exploratory laparotomy, Total hysterectomy, Bilateral salpingo-oophorectomy, Omentectomy, Sub-optimal debulking. Pathology high grade serous cancer involving surface of ovaries and omentum.  Genetic Testing: Ambry panel testing negative for germline mutation. The Ambry CancerNext+RNAinsight Panel includes sequencing, rearrangement analysis, and RNA analysis for the following 40 genes: APC, ATM, BAP1, BARD1, BMPR1A, BRCA1, BRCA2, BRIP1, CDH1, CDKN2A, CHEK2, FH, FLCN, MET, MLH1, MSH2, MSH6, MUTYH, NF1, NTHL1, PALB2, PMS2, PTEN, RAD51C, RAD51D, RPS20, SMAD4, STK11, TP53, TSC1, TSC2, and VHL (sequencing and deletion/duplication); AXIN2, HOXB13, MBD4, MSH3, POLD1 and POLE (sequencing only); EPCAM and GREM1 (deletion/duplication only).   Tumor Testing: Myriad MyChoice shows HRD but no somatic BRCA mutation. GIS score of 43.    Her family history includes father who had lung cancer attributed to smoking, but no history of colon, ovarian, or breast cancer.  Problem List: Patient Active Problem List   Diagnosis Date Noted   Constipation 03/19/2024   Need for prophylactic vaccination and inoculation against influenza 01/29/2024    Genetic testing 10/21/2023  Weight loss 10/17/2023   Chemotherapy-induced neuropathy 10/17/2023   IDA (iron  deficiency anemia) 10/10/2023   Encounter for antineoplastic chemotherapy 10/10/2023   Symptomatic anemia 10/10/2023   Ovarian cancer (HCC) 10/04/2023   Liver lesion 09/18/2023   Peritoneal carcinomatosis (HCC) 09/18/2023   Iron  deficiency anemia 09/18/2023   Positive self-administered antigen test for COVID-19 01/16/2023   Routine general medical examination at a health care facility 08/17/2022   Hyperparathyroidism due to renal insufficiency 08/16/2022   Prediabetes 08/15/2021   Obesity (BMI 30.0-34.9) 07/25/2020   Proteinuria 06/25/2019   Depression 11/23/2015   CKD (chronic kidney disease) stage 3, GFR 30-59 ml/min (HCC) 11/23/2015   Hyperlipidemia 11/23/2015    Past Medical History: Past Medical History:  Diagnosis Date   Anemia    Arthritis    Asthma    as a child   Cataract 2019   Chicken pox    Chronic kidney disease 2015   Colon polyps 2012   Depression    Hemorrhoids    Ovarian cancer [facility])     Past Surgical History: Past Surgical History:  Procedure Laterality Date   ABDOMINAL HYSTERECTOMY     BREAST BIOPSY  03/09/2024   RIGHT BREAST STEREO CALCS-RIBBON MARKER-PATH PENDING   BREAST BIOPSY Right 03/09/2024   MM RT BREAST BX W LOC DEV 1ST LESION IMAGE BX SPEC STEREO GUIDE 03/09/2024 [facility]   BREAST CYST ASPIRATION Left 90s   BREAST SURGERY  2007   aspiration   CARPAL TUNNEL RELEASE  2010   REFRACTIVE SURGERY     TONSILLECTOMY      Past Gynecologic History:  Menarche: in 1964 Menstrual details: Postmenopausal Last Menstrual Period: unknown History of Abnormal pap: No history of abnormal Paps   OB History:  OB History  Gravida Para Term Preterm AB Living  1 1 1   1   SAB IAB Ectopic Multiple Live Births      1    # Outcome Date GA Lbr Len/2nd Weight Sex Type Anes PTL Lv  1 Term             Family History: Family History   Problem Relation Age of Onset   Alzheimer's disease Mother    Depression Mother    Vision loss Mother    Lung cancer Father    Cancer Father    Prostate cancer Maternal Uncle    Prostate cancer Maternal Uncle    Prostate cancer Maternal Uncle    Prostate cancer Maternal Uncle    Prostate cancer Maternal Uncle    Prostate cancer Other        in mat great uncles, unk#   Prostate cancer Cousin        in 4-5 mat  first cousins   Breast cancer Neg Hx     Social History: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  She has 1 biological child and 2 children from the marriage.  She has 4 grandchildren. Social History   Socioeconomic History   Marital status: Married    Spouse name: Not on file   Number of children: Not on file   Years of education: Not on file   Highest education level: Associate degree: occupational, scientist, product/process development, or vocational program  Occupational History   Not on file  Tobacco Use   Smoking status: Never   Smokeless tobacco: Never  Vaping Use   Vaping status: Never Used  Substance and Sexual Activity   Alcohol use: No  Drug use: No   Sexual activity: Yes  Other Topics Concern   Not on file  Social History Narrative   married   Social Drivers of Health   Tobacco Use: Low Risk (04/15/2024)   Patient History    Smoking Tobacco Use: Never    Smokeless Tobacco Use: Never    Passive Exposure: Not on file  Financial Resource Strain: Low Risk (02/07/2024)   Overall Financial Resource Strain (CARDIA)    Difficulty of Paying Living Expenses: Not hard at all  Food Insecurity: No Food Insecurity (02/07/2024)   Epic    Worried About Programme Researcher, Broadcasting/film/video in the Last Year: Never true    Ran Out of Food in the Last Year: Never true  Transportation Needs: No Transportation Needs (02/07/2024)   Epic    Lack of Transportation (Medical): No    Lack of Transportation (Non-Medical): No  Physical Activity: Inactive (02/07/2024)    Exercise Vital Sign    Days of Exercise per Week: 0 days    Minutes of Exercise per Session: Not on file  Stress: No Stress Concern Present (02/07/2024)   [employer] of Occupational Health - Occupational Stress Questionnaire    Feeling of Stress: Not at all  Social Connections: Moderately Integrated (02/07/2024)   Social Connection and Isolation Panel    Frequency of Communication with Friends and Family: Once a week    Frequency of Social Gatherings with Friends and Family: Once a week    Attends Religious Services: More than 4 times per year    Active Member of [employer] or Organizations: Yes    Attends Banker Meetings: More than 4 times per year    Marital Status: Married  Catering Manager Violence: Not At Risk (09/25/2023)   Humiliation, Afraid, Rape, and Kick questionnaire    Fear of Current or Ex-Partner: No    Emotionally Abused: No    Physically Abused: No    Sexually Abused: No  Depression (PHQ2-9): Low Risk (02/10/2024)   Depression (PHQ2-9)    PHQ-2 Score: 2  Alcohol Screen: Low Risk (06/03/2023)   Alcohol Screen    Last Alcohol Screening Score (AUDIT): 0  Housing: Low Risk (02/07/2024)   Epic    Unable to Pay for Housing in the Last Year: No    Number of Times Moved in the Last Year: 0    Homeless in the Last Year: No  Utilities: Not At Risk (01/07/2024)   Received from [facility]   Epic    In the past 12 months has the electric, gas, oil, or water company threatened to shut off services in your home?: No  Health Literacy: Adequate Health Literacy (06/03/2023)   B1300 Health Literacy    Frequency of need for help with medical instructions: Never    Allergies: No Known Allergies  Current Medications: Current Outpatient Medications  Medication Sig Dispense Refill   citalopram  (CELEXA ) 40 MG tablet Take 1 tablet (40 mg total) by mouth daily. 30 tablet 3   lactulose  (CHRONULAC ) 10 GM/15ML solution take15 to 30 ml 3 times a day  when needed for constipation 236 mL 0   losartan  (COZAAR ) 50 MG tablet Take 1 tablet (50 mg total) by mouth daily. 30 tablet 1   olaparib  (LYNPARZA ) 100 MG tablet Take 2 tablets (200 mg total) by mouth 2 (two) times daily. May take with food to decrease nausea and vomiting. 120 tablet 1   oxyCODONE  (OXY IR/ROXICODONE ) 5 MG immediate release tablet  Take 1 tablet (5 mg total) by mouth every 8 (eight) hours as needed for severe pain (pain score 7-10). 30 tablet 0   polyethylene glycol powder (GLYCOLAX /MIRALAX ) 17 GM/SCOOP powder Take by mouth once.     senna (SENOKOT) 8.6 MG TABS tablet Take 2 tablets (17.2 mg total) by mouth daily. 120 tablet 0   No current facility-administered medications for this visit.   Facility-Administered Medications Ordered in Other Visits  Medication Dose Route Frequency Provider Last Rate Last Admin   heparin  lock flush 100 unit/mL  500 Units Intravenous Once Babara Call, MD        Review of Systems General: no complaints  HEENT: no complaints  Lungs: no complaints  Cardiac: no complaints  GI: no complaints  GU: no complaints  Musculoskeletal: no complaints  Extremities: no complaints  Skin: no complaints  Neuro: no complaints  Endocrine: no complaints  Psych: no complaints      Objective:  Physical Examination:  BP (!) 143/94   Pulse 85   Temp 98.6 F (37 C)   Resp 20   Wt 165 lb 6.4 oz (75 kg)   SpO2 100%   BMI 30.25 kg/m   Performance status: 1  GENERAL: Patient is a well appearing female in no acute distress HEENT:  Atraumatic and normocephalic. PERRL, neck supple. ABDOMEN:  Soft, nontender. Nondistended. No masses/ascites/hernia/or hepatomegaly. Well healed incision, closed dry  and intact.  EXTREMITIES:  No peripheral edema.   SKIN:  Clear with no obvious rashes or skin changes. No nail dyscrasia. NEURO:  Nonfocal. Well oriented.  Appropriate affect.  Pelvic: EGBUS: no lesions Cervix: surgically absent Vagina: area of granulation tissue  on the left treated with AgNO3.  Uterus: surgically absent Rectovaginal: deferred  Lab Review Lab Results  Component Value Date   WBC 5.4 04/09/2024   HGB 11.2 (L) 04/09/2024   HCT 34.6 (L) 04/09/2024   MCV 100.6 (H) 04/09/2024   PLT 163 04/09/2024     Chemistry      Component Value Date/Time   NA 134 (L) 04/09/2024 0829   NA 140 12/18/2018 0000   K 4.4 04/09/2024 0829   CL 100 04/09/2024 0829   CO2 22 04/09/2024 0829   BUN 27 (H) 04/09/2024 0829   BUN 20 12/18/2018 0000   CREATININE 1.22 (H) 04/09/2024 0829   GLU 97 12/18/2018 0000      Component Value Date/Time   CALCIUM 9.5 04/09/2024 0829   ALKPHOS 118 04/09/2024 0829   AST 29 04/09/2024 0829   ALT 30 04/09/2024 0829   BILITOT 0.2 04/09/2024 0829      Lab Results  Component Value Date   CALCIUM 9.5 04/09/2024   Component Ref Range & Units (hover) 6 d ago (04/09/24) 3 wk ago (03/19/24) 1 mo ago (02/27/24) 2 mo ago (02/06/24) 4 mo ago (12/12/23) 4 mo ago (11/21/23) 5 mo ago (10/31/23)  Cancer Antigen (CA) 125 40.5 High  47.3 High  CM 96.1 High  CM 124.0 High  CM 187.0 High  CM 218.0 High  CM 396.0 High  CM   Radiologic Imaging: 12/02/2023 EXAM: CT CHEST, ABDOMEN AND PELVIS WITH CONTRAST 12/02/2023 02:33:35 PM   TECHNIQUE: CT of the chest, abdomen and pelvis was performed with the administration of intravenous contrast. Multiplanar reformatted images are provided for review. Automated exposure control, iterative reconstruction, and/or weight based adjustment of the mA/kV was utilized to reduce the radiation dose to as low as reasonably achievable.   COMPARISON: CT chest from  10/03/2023 and CT abdomen and pelvis from 09/16/2023.   CLINICAL HISTORY: Ovarian cancer. Ovarian cancer (HCC); Staging form: Ovary, Fallopian Tube, and Primary Peritoneal Carcinoma, AJCC 8th Edition; - Clinical stage from 09/25/2023: FIGO Stage IV (cT3c, cN1b, cM1) - Signed by Babara Call, MD on 10/05/2023; Ovarian cancer [facility]); Image  findings and pathology results were reviewed and discussed with patient.; Working diagnosis is high-grade serous carcinoma of the reproductive organ origin-stage IV ovarian cancer, primary peritoneal carcinomatosis.; Normal CEA.   FINDINGS:   CHEST:   MEDIASTINUM: Heart and pericardium are unremarkable. The central airways are clear.   THORACIC LYMPH NODES: No mediastinal, hilar or axillary lymphadenopathy.   LUNGS AND PLEURA: Right lower lobe lung nodule measures 3 mm, image 64/4, previously 5 mm. The previous 4 mm right lower lobe lung nodule has resolved in the interval. Previous 4 mm nodule within the lower right lower lobe measures 2 mm, image 92/4, previously 4 mm. Calcified granuloma identified within the left upper lobe. No pleural effusion or pneumothorax.   ABDOMEN AND PELVIS:   LIVER: Unchanged cyst along the dome of left lobe of liver measuring 2.3 cm, image 46/2. Inferior right lobe of liver metastasis measures 3.5 x 2.2 cm, image 59/2, previously 4.5 x 3.5 cm. Index lesion within segment 5 measures 1.3 x 0.9 cm, image 59/2, previously 1.6 x 1.1 cm. No new liver lesions.   GALLBLADDER AND BILE DUCTS: Gallstones are again noted. No biliary ductal dilatation. Common bile duct is upper limits of normal measuring 6 mm, similar to previous exam.   SPLEEN: Multiple splenic metastases. Index lesion within the posterior lateral spleen measures 2.4 x 1.6 cm, image 55/2, previously 3.2 x 2.0 cm. Inferior splenic lesion measures 3.1 x 2.5 cm, previously this measured the same.   PANCREAS: No pancreatic inflammation, main duct dilatation or mass.   ADRENAL GLANDS: Normal adrenal glands.   KIDNEYS, URETERS AND BLADDER: Bilateral renal cortical scarring and volume loss noted. No nephrolithiasis, hydronephrosis or suspicious mass. No bladder abnormality.   GI AND BOWEL: No signs of bowel obstruction. Moderate stool burden noted throughout the colon.   PERITONEUM AND  RETROPERITONEUM: Peritoneal metastasis again noted. Index lesion within the left hemiabdomen along the undersurface of the ventral abdominal wall measures 2.6 x 1.6 cm, image 75/2, on the previous exam this measured 4.3 x 2.6 cm. At a slightly higher level peritoneal nodule measures 2.2 x 1.8 cm, image 70/2, previously 3.1 x 2.6 cm. Soft tissue nodularity along the undersurface of both hemidiaphragms is also improved in the interval. Left upper quadrant peritoneal nodule measures 2.5 x 1.8 cm, image 63/2, on the previous exam is measured 3.3 x 2.4 cm. Interval resolution of previously noted ascites.   VASCULATURE: Aorta is normal in caliber.   ABDOMINAL AND PELVIS LYMPH NODES: Abdominal pelvic adenopathy is again noted. Index aortocaval lymph node measures 1 cm short axis, image 77/2, previously 1.1 cm. Index lymph node left periaortic lymph node measures 1.4 cm, image 60/2, previously 1.5 cm. Left common iliac lymph node measures 0.9 cm, image 84/2, previously 1.2 cm. Right external iliac node measures 0.9 cm, image 101/2, previously 1 cm.   REPRODUCTIVE ORGANS: No focal abnormality involving the uterus.   BONES AND SOFT TISSUES: No acute or suspicious osseous findings.   IMPRESSION: 1. Interval improvement in peritoneal metastases, splenic metastases, and liver metastases. 2. Interval resolution of previously noted ascites. 3. Abdominal pelvic adenopathy, improved in the interval. 4. Interval improvement in right lower lobe lung nodules.  Assessment:  Beth Hart is a 73 y.o. female diagnosed with stage IV high grade serous ovarian cancer (CA125 609) with peritoneal carcinomatosis 5/25 s/p NACT with interval suboptimal debulking on 01/07/2024 with TAHBSO omentectomy.  Intrahepatic and splenic metastases prevented optimal cytoreduction.  S/p additional 3 cycles of chemotherapy for 7 total with Bevacizumab  added for cycle 6. Bevacizumab  was held for cycle 7 due to  proteinuria. CA125 40.  Now on maintenance Bevacizumab  and Olarparib 200mg  BID.  Just started olaparib  last week and tolerating well so far without nausea.   Ambry panel testing negative for germline mutations. Myriad MyChoice shows HRD but no somatic BRCA mutation  Medical co-morbidities complicating care: Anemia, azotemia, asthma Plan:   Problem List Items Addressed This Visit       Endocrine   Ovarian cancer (HCC) - Primary (Chronic)    She will continue on maintenance Olaparib /Avastin  under Dr Babara with CA125 and CT monitoring.    We will see her back in about 3 months.    Prentice Agent, MD   CC:  Dr. Zelphia Babara

## 2024-04-16 ENCOUNTER — Other Ambulatory Visit: Payer: Self-pay

## 2024-04-16 ENCOUNTER — Other Ambulatory Visit: Payer: Self-pay | Admitting: Pharmacist

## 2024-04-16 NOTE — Progress Notes (Signed)
 Specialty Pharmacy Refill Coordination Note  Beth Hart is a 73 y.o. female contacted today regarding refills of specialty medication(s) Olaparib  (LYNPARZA )   Patient requested Delivery   Delivery date: 05/01/24   Verified address: (Patient-Rptd) [address] [name] [address]   Medication will be filled on: 04/29/24

## 2024-04-16 NOTE — Progress Notes (Signed)
 Clinical Intervention Note  Clinical Intervention Notes: Patient started taking losartan  25mg . No DDI's found with Lynparza .   Clinical Intervention Outcomes: Prevention of an adverse drug event   Lyle LELON Chalk Specialty Pharmacist

## 2024-04-17 ENCOUNTER — Other Ambulatory Visit: Payer: Self-pay

## 2024-04-17 DIAGNOSIS — C569 Malignant neoplasm of unspecified ovary: Secondary | ICD-10-CM

## 2024-04-20 ENCOUNTER — Inpatient Hospital Stay

## 2024-04-20 DIAGNOSIS — C569 Malignant neoplasm of unspecified ovary: Secondary | ICD-10-CM

## 2024-04-20 DIAGNOSIS — Z5111 Encounter for antineoplastic chemotherapy: Secondary | ICD-10-CM | POA: Diagnosis not present

## 2024-04-20 LAB — CBC WITH DIFFERENTIAL (CANCER CENTER ONLY)
Abs Immature Granulocytes: 0.01 K/uL (ref 0.00–0.07)
Basophils Absolute: 0 K/uL (ref 0.0–0.1)
Basophils Relative: 1 %
Eosinophils Absolute: 0.1 K/uL (ref 0.0–0.5)
Eosinophils Relative: 2 %
HCT: 33.5 % — ABNORMAL LOW (ref 36.0–46.0)
Hemoglobin: 11.1 g/dL — ABNORMAL LOW (ref 12.0–15.0)
Immature Granulocytes: 0 %
Lymphocytes Relative: 41 %
Lymphs Abs: 1.9 K/uL (ref 0.7–4.0)
MCH: 32.6 pg (ref 26.0–34.0)
MCHC: 33.1 g/dL (ref 30.0–36.0)
MCV: 98.2 fL (ref 80.0–100.0)
Monocytes Absolute: 0.5 K/uL (ref 0.1–1.0)
Monocytes Relative: 11 %
Neutro Abs: 2.1 K/uL (ref 1.7–7.7)
Neutrophils Relative %: 45 %
Platelet Count: 166 K/uL (ref 150–400)
RBC: 3.41 MIL/uL — ABNORMAL LOW (ref 3.87–5.11)
RDW: 14.8 % (ref 11.5–15.5)
WBC Count: 4.7 K/uL (ref 4.0–10.5)
nRBC: 0 % (ref 0.0–0.2)

## 2024-04-20 LAB — CMP (CANCER CENTER ONLY)
ALT: 17 U/L (ref 0–44)
AST: 21 U/L (ref 15–41)
Albumin: 4.1 g/dL (ref 3.5–5.0)
Alkaline Phosphatase: 105 U/L (ref 38–126)
Anion gap: 12 (ref 5–15)
BUN: 33 mg/dL — ABNORMAL HIGH (ref 8–23)
CO2: 22 mmol/L (ref 22–32)
Calcium: 9.6 mg/dL (ref 8.9–10.3)
Chloride: 102 mmol/L (ref 98–111)
Creatinine: 1.78 mg/dL — ABNORMAL HIGH (ref 0.44–1.00)
GFR, Estimated: 30 mL/min — ABNORMAL LOW
Glucose, Bld: 122 mg/dL — ABNORMAL HIGH (ref 70–99)
Potassium: 4.2 mmol/L (ref 3.5–5.1)
Sodium: 135 mmol/L (ref 135–145)
Total Bilirubin: 0.4 mg/dL (ref 0.0–1.2)
Total Protein: 6.8 g/dL (ref 6.5–8.1)

## 2024-04-28 ENCOUNTER — Other Ambulatory Visit: Payer: Self-pay

## 2024-04-29 ENCOUNTER — Other Ambulatory Visit: Payer: Self-pay

## 2024-05-01 ENCOUNTER — Other Ambulatory Visit: Payer: Self-pay

## 2024-05-01 ENCOUNTER — Other Ambulatory Visit: Payer: Self-pay | Admitting: Oncology

## 2024-05-01 DIAGNOSIS — C563 Malignant neoplasm of bilateral ovaries: Secondary | ICD-10-CM

## 2024-05-01 MED ORDER — OLAPARIB 100 MG PO TABS
200.0000 mg | ORAL_TABLET | Freq: Two times a day (BID) | ORAL | 1 refills | Status: AC
  Filled 2024-05-01 – 2024-05-22 (×3): qty 120, 30d supply, fill #0

## 2024-05-04 ENCOUNTER — Other Ambulatory Visit: Payer: Self-pay

## 2024-05-04 NOTE — Progress Notes (Signed)
 Specialty Pharmacy Ongoing Clinical Assessment Note  Beth Hart is a 74 y.o. female who is being followed by the specialty pharmacy service for RxSp Oncology   Patient's specialty medication(s) reviewed today: Olaparib  (LYNPARZA )   Missed doses in the last 4 weeks: 0   Patient/Caregiver did not have any additional questions or concerns.   Therapeutic benefit summary: Patient is achieving benefit   Adverse events/side effects summary: Experienced adverse events/side effects (mild occasional nausea and some joint pain (both are tolerable at this time))   Patient's therapy is appropriate to: Continue    Goals Addressed             This Visit's Progress    Slow Disease Progression   On track    Patient is on track. Patient will maintain adherence.  CA 125 was down 40.5 as of 04/09/24.         Follow up: 3 months  Silvano Beth Hart Specialty Pharmacist

## 2024-05-06 ENCOUNTER — Encounter: Payer: Self-pay | Admitting: Oncology

## 2024-05-06 ENCOUNTER — Ambulatory Visit

## 2024-05-07 ENCOUNTER — Telehealth: Admitting: Hospice and Palliative Medicine

## 2024-05-07 ENCOUNTER — Other Ambulatory Visit: Payer: Self-pay

## 2024-05-07 ENCOUNTER — Inpatient Hospital Stay

## 2024-05-07 ENCOUNTER — Encounter: Payer: Self-pay | Admitting: Oncology

## 2024-05-07 ENCOUNTER — Inpatient Hospital Stay: Admitting: Oncology

## 2024-05-07 ENCOUNTER — Inpatient Hospital Stay: Attending: Oncology

## 2024-05-07 ENCOUNTER — Inpatient Hospital Stay: Admitting: Hospice and Palliative Medicine

## 2024-05-07 VITALS — BP 118/71 | HR 78 | Temp 98.8°F | Resp 18 | Wt 166.5 lb

## 2024-05-07 DIAGNOSIS — T451X5A Adverse effect of antineoplastic and immunosuppressive drugs, initial encounter: Secondary | ICD-10-CM | POA: Diagnosis not present

## 2024-05-07 DIAGNOSIS — Z5112 Encounter for antineoplastic immunotherapy: Secondary | ICD-10-CM | POA: Insufficient documentation

## 2024-05-07 DIAGNOSIS — C786 Secondary malignant neoplasm of retroperitoneum and peritoneum: Secondary | ICD-10-CM | POA: Insufficient documentation

## 2024-05-07 DIAGNOSIS — Z79899 Other long term (current) drug therapy: Secondary | ICD-10-CM | POA: Diagnosis not present

## 2024-05-07 DIAGNOSIS — C563 Malignant neoplasm of bilateral ovaries: Secondary | ICD-10-CM

## 2024-05-07 DIAGNOSIS — N1831 Chronic kidney disease, stage 3a: Secondary | ICD-10-CM

## 2024-05-07 DIAGNOSIS — R809 Proteinuria, unspecified: Secondary | ICD-10-CM | POA: Diagnosis not present

## 2024-05-07 DIAGNOSIS — Z801 Family history of malignant neoplasm of trachea, bronchus and lung: Secondary | ICD-10-CM | POA: Diagnosis not present

## 2024-05-07 DIAGNOSIS — C569 Malignant neoplasm of unspecified ovary: Secondary | ICD-10-CM | POA: Diagnosis present

## 2024-05-07 DIAGNOSIS — G620 Drug-induced polyneuropathy: Secondary | ICD-10-CM | POA: Diagnosis not present

## 2024-05-07 DIAGNOSIS — Z5111 Encounter for antineoplastic chemotherapy: Secondary | ICD-10-CM

## 2024-05-07 LAB — CBC WITH DIFFERENTIAL (CANCER CENTER ONLY)
Abs Immature Granulocytes: 0.01 K/uL (ref 0.00–0.07)
Basophils Absolute: 0 K/uL (ref 0.0–0.1)
Basophils Relative: 1 %
Eosinophils Absolute: 0.1 K/uL (ref 0.0–0.5)
Eosinophils Relative: 2 %
HCT: 33.7 % — ABNORMAL LOW (ref 36.0–46.0)
Hemoglobin: 11.1 g/dL — ABNORMAL LOW (ref 12.0–15.0)
Immature Granulocytes: 0 %
Lymphocytes Relative: 43 %
Lymphs Abs: 1.9 K/uL (ref 0.7–4.0)
MCH: 33 pg (ref 26.0–34.0)
MCHC: 32.9 g/dL (ref 30.0–36.0)
MCV: 100.3 fL — ABNORMAL HIGH (ref 80.0–100.0)
Monocytes Absolute: 0.6 K/uL (ref 0.1–1.0)
Monocytes Relative: 13 %
Neutro Abs: 1.8 K/uL (ref 1.7–7.7)
Neutrophils Relative %: 41 %
Platelet Count: 161 K/uL (ref 150–400)
RBC: 3.36 MIL/uL — ABNORMAL LOW (ref 3.87–5.11)
RDW: 17 % — ABNORMAL HIGH (ref 11.5–15.5)
WBC Count: 4.3 K/uL (ref 4.0–10.5)
nRBC: 0 % (ref 0.0–0.2)

## 2024-05-07 LAB — CMP (CANCER CENTER ONLY)
ALT: 14 U/L (ref 0–44)
AST: 22 U/L (ref 15–41)
Albumin: 4.1 g/dL (ref 3.5–5.0)
Alkaline Phosphatase: 98 U/L (ref 38–126)
Anion gap: 12 (ref 5–15)
BUN: 33 mg/dL — ABNORMAL HIGH (ref 8–23)
CO2: 20 mmol/L — ABNORMAL LOW (ref 22–32)
Calcium: 9.8 mg/dL (ref 8.9–10.3)
Chloride: 106 mmol/L (ref 98–111)
Creatinine: 1.59 mg/dL — ABNORMAL HIGH (ref 0.44–1.00)
GFR, Estimated: 34 mL/min — ABNORMAL LOW
Glucose, Bld: 142 mg/dL — ABNORMAL HIGH (ref 70–99)
Potassium: 4.7 mmol/L (ref 3.5–5.1)
Sodium: 138 mmol/L (ref 135–145)
Total Bilirubin: 0.2 mg/dL (ref 0.0–1.2)
Total Protein: 6.8 g/dL (ref 6.5–8.1)

## 2024-05-07 LAB — TOTAL PROTEIN, URINE DIPSTICK: Protein, ur: 100 mg/dL — AB

## 2024-05-07 NOTE — Assessment & Plan Note (Signed)
 Encouraged oral hydration, avoid nephrotoxins.

## 2024-05-07 NOTE — Assessment & Plan Note (Signed)
 Chemotherapy treatments as listed above.

## 2024-05-07 NOTE — Progress Notes (Signed)
 " Hematology/Oncology Progress note [phone] [phone]    CHIEF COMPLAINTS/REASON FOR VISIT:  High-grade serous carcinoma of ovaries. peritoneal carcinomatosis, liver lesion.   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  Elevated CA 125 609, elevated CA 27.29 -415  -Mychoice HRD positive  S/p neoadjuvant carboplatin /Taxol  x 4 [Bevacizumab  was not given in neoadjuvant setting due to tumor abutting bowel wall]  S/p  debulking surgery.  S/p additional 3 cycles of chemotherapy. With Bevacizumab  added for cycle 6. Bevacizumab  was held for cycle 7 due to proteinuria Labs are reviewed and discussed with patient. Continue Olarparib 200mg  BID. Hold Bevacizumab  due to proteinuria.      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   CKD (chronic kidney disease) stage 3, GFR 30-59 ml/min (HCC) Encouraged oral hydration, avoid nephrotoxins.   Encounter for antineoplastic chemotherapy Chemotherapy treatments as listed above.   Proteinuria Exacerbated by Bevacizumab .  Previously discussed with Dr. Dennise and she has started Losartan  50mg  daily Recommend pt to follow up with nephrology  Orders Placed This Encounter  Procedures   Total Protein, Urine dipstick    Standing Status:   Future    Number of Occurrences:   1    Expected Date:   05/07/2024    Expiration Date:   05/28/2025   Total Protein, Urine dipstick    Standing Status:   Future    Expected Date:   05/07/2024    Expiration Date:   05/07/2025   Total Protein, Urine dipstick    Standing Status:   Future    Expected Date:   05/28/2024    Expiration Date:    05/28/2025   CMP (Cancer Center only)    Standing Status:   Future    Expected Date:   05/28/2024    Expiration Date:   05/28/2025   CA 125    Standing Status:   Future    Expected Date:   05/28/2024    Expiration Date:   05/28/2025   CBC with Differential (Cancer Center Only)    Standing Status:   Future    Expected Date:   05/28/2024    Expiration Date:   05/28/2025   Total Protein, Urine dipstick    Standing Status:   Future    Expected Date:   05/28/2024    Expiration Date:   05/28/2025   Total Protein, Urine dipstick    Standing Status:   Future    Expected Date:   06/18/2024    Expiration Date:   06/18/2025   CMP (Cancer Center only)    Standing Status:   Future    Expected Date:   06/18/2024    Expiration Date:   06/18/2025   CA 125    Standing Status:   Future    Expected Date:   06/18/2024    Expiration Date:   06/18/2025   CBC with Differential (Cancer Center Only)    Standing Status:   Future    Expected Date:   06/18/2024    Expiration Date:   06/18/2025   Follow-up 3 weeks.   All questions were answered. The patient knows to call the clinic with any problems, questions or  concerns.  [name], MD, PhD [facility] Hematology Oncology 05/07/2024   HISTORY OF PRESENTING ILLNESS:   Beth Hart is a  74 y.o.  female with PMH listed below presents for follow up of stage IV ovarian high grade serous carcinoma.  Oncology History  Peritoneal carcinomatosis (HCC)  09/18/2023 Initial Diagnosis   Peritoneal carcinomatosis (HCC)   10/10/2023 - 03/19/2024 Chemotherapy   Patient is on Treatment Plan : OVARIAN Carboplatin   + Paclitaxel  ) q21d      Genetic Testing   Negative genetic testing. No pathogenic variants identified on the [facility] CancerNext+RNA panel. The report date is 10/20/2023.  The [facility] Cancer[facility]+RNAinsight Panel includes sequencing, rearrangement analysis, and RNA analysis for the following 40 genes: APC, ATM, BAP1, BARD1, BMPR1A, BRCA1, BRCA2, BRIP1, CDH1, CDKN2A,  CHEK2, FH, FLCN, MET, MLH1, MSH2, MSH6, MUTYH, NF1, NTHL1, PALB2, PMS2, PTEN, RAD51C, RAD51D, RPS20, SMAD4, STK11, TP53, TSC1, TSC2, and VHL (sequencing and deletion/duplication); AXIN2, HOXB13, MBD4, MSH3, POLD1 and POLE (sequencing only); EPCAM and GREM1 (deletion/duplication only).   04/09/2024 -  Chemotherapy   Patient is on Treatment Plan : Bevacizumab  q21d     Ovarian cancer (HCC)  09/25/2023 Cancer Staging   Staging form: Ovary, Fallopian Tube, and Primary Peritoneal Carcinoma, AJCC 8th Edition - Clinical stage from 09/25/2023: FIGO Stage IV (cT3c, cN1b, cM1) - Signed by Cap Zelphia, MD on 10/05/2023 Stage prefix: Initial diagnosis   10/04/2023 Initial Diagnosis   Ovarian cancer  She experiences pressure and sharp pain in her abdomen, initially attributing it to a pulled muscle from yard work. The pain persisted and was accompanied by a fever, prompting her to visit the ER on 09/16/2023. The fever was around 100F at home and remained at that level upon arrival at the ER. She has a persistent cough, especially when lying down.   09/16/2023, CT abdomen pelvis with contrast showed extensive omental and peritoneal carcinomatosis with small to moderate ascites. multiple hypoattenuating masses in the liver and spleen, compatible with metastases. There are also multiple mildly enlarged retroperitoneal lymph nodes, also highly concerning for metastases.  09/24/2023, cytology from peritoneal ascites showed negative for malignancy. 09/25/2023, endometrial biopsy showed no malignancy. 09/26/2023 Patient underwent peritoneal mass biopsy,  1. Peritoneum, biopsy, RUQ mass :       - METASTATIC HIGH-GRADE SEROUS CARCINOMA WITH NECROSIS, SEE NOTE   Diagnosis Note : Immunohistochemical stains show that the tumor cells are positive for PAX8, p16 and p53 (clonal overexpression pattern) while they are negative for p63 and CK5/6.  This immunoprofile is consistent with above interpretation.    Tumor Testing: [facility]  [facility] shows HRD but no somatic BRCA mutation Genomic instability  score of 43.   10/10/2023 - 03/19/2024 Chemotherapy   Patient is on Treatment Plan : OVARIAN Carboplatin   + Paclitaxel  ) q21d      Imaging   CT chest w contrast  Several scattered sub 5 mm bilateral lung nodules identified. Based on appearance and other findings these are worrisome for early lung metastases. Few enlarged pre cardiophrenic mediastinal nodes as well as left internal mammary chain lymph nodes also worrisome for spread of disease.Tiny left pleural effusion.   Please correlate with previous abdomen pelvis CT describing abdominal findings.      Genetic Testing   Negative genetic testing. No pathogenic variants identified on the [facility] Cancer[facility]+RNA panel. The report date is 10/20/2023.  The [facility] CancerNext+RNAinsight Panel includes sequencing, rearrangement analysis, and RNA analysis for the following 40 genes: APC, ATM, BAP1, BARD1, BMPR1A, BRCA1,  BRCA2, BRIP1, CDH1, CDKN2A, CHEK2, FH, FLCN, MET, MLH1, MSH2, MSH6, MUTYH, NF1, NTHL1, PALB2, PMS2, PTEN, RAD51C, RAD51D, RPS20, SMAD4, STK11, TP53, TSC1, TSC2, and VHL (sequencing and deletion/duplication); AXIN2, HOXB13, MBD4, MSH3, POLD1 and POLE (sequencing only); EPCAM and GREM1 (deletion/duplication only).   01/07/2024 Surgery   Debulking surgery  Pathology showed  A. Omentum, omentectomy:   High-grade serous carcinoma (7.5 cm).     B. Left fallopian tube and ovary, left salpingo-oophorectomy:   High-grade serous carcinoma with surface involvement of the ovary. Benign fallopian tube.     C. Right fallopian tube and ovary, right salpingo-oophorectomy:   High-grade serous carcinoma with surface involvement of the ovary and fallopian tube.     D. Uterus and cervix, hysterectomy:   Uterus (39 grams): Endometrium: Inactive. Myometrium: Benign.  Cervix: Benign. Serosa: High-grade serous carcinoma.  TUMOR    Tumor Site:    Bilateral ovaries      Tumor Size:    Greatest Dimension (Centimeters): 1.7 cm     Histologic Type:    High-grade serous carcinoma     Ovarian Surface Involvement:    Present, right and left     Fallopian Tube Surface Involvement:    Present, right     Other Tissue / Organ Involvement:    Right ovary     Other Tissue / Organ Involvement:    Left ovary     Other Tissue / Organ Involvement:    Right fallopian tube     Other Tissue / Organ Involvement:    Omentum     Other Tissue / Organ Involvement:    uterine serosa     Largest Extrapelvic Peritoneal Focus:    Macroscopic (greater than 2 cm): Omentum     Peritoneal / Ascitic Fluid Involvement:    Not submitted / unknown     Chemotherapy Response Score (CRS):    CRS1 (no definite or minimal response)   REGIONAL LYMPH NODES     Regional Lymph Node Status:    Not applicable (no regional lymph nodes submitted or found)   pTNM CLASSIFICATION (AJCC 8th Edition)     Reporting of pT, pN, and (when applicable) pM categories is based on information available to the pathologist at the time the report is issued. As per the AJCC (Chapter 1, 8th Ed.) it is the managing physician's responsibility to establish the final pathologic stage based upon all pertinent information, including but potentially not limited to this pathology report.     Modified Classification:    y     pT Category:    pT3c     pN Category:    pN not assigned (no nodes submitted or found)      04/09/2024 -  Chemotherapy   Patient is on Treatment Plan : Bevacizumab  q21d      Her family history includes father who had lung cancer attributed to smoking, but no history of colon, ovarian, or breast cancer. Discussed the use of AI scribe software for clinical note transcription with the patient, who gave verbal consent to proceed.    She underwent a colonoscopy a few years ago, which was reported to be negative.  And a mammogram six months ago, which was negative she does not recall any recent stomach pain,  dysphagia, or acid reflux.  INTERVAL HISTORY Beth Hart is a 74 y.o. female who has above history reviewed by me today presents for follow up visit for Stage IV ovarian cancer.   Discussed the use of  AI scribe software for clinical note transcription with the patient, who gave verbal consent to proceed.   She recently initiated olaparib  and losartan . Since starting these medications, she has developed new intermittent arthralgias involving the shoulders, knees, and back, described as persistent and more pronounced when lying down or at rest, with improvement during activity. Acetaminophen  650 mg has not provided relief,  The arthralgias are tolerable and do not significantly impact daily functioning.  She continues to experience a persistent metallic taste.  Use of plastic utensils provides partial relief, and the metallic taste is absent during eating. She continues to experience neuropathy in her toes, which she reports has been present since prior chemotherapy.  She denies diarrhea and reports normal bowel movements. She experiences nocturnal nausea when lying down, which is managed with bread or crackers. She has antiemetics available but is not currently using them.   MEDICAL HISTORY:  Past Medical History:  Diagnosis Date   Anemia    Arthritis    Asthma    as a child   Cataract 2019   Chicken pox    Chronic kidney disease 2015   Colon polyps 2012   Depression    Hemorrhoids    Ovarian cancer (HCC)     SURGICAL HISTORY: Past Surgical History:  Procedure Laterality Date   ABDOMINAL HYSTERECTOMY     BREAST BIOPSY  03/09/2024   RIGHT BREAST STEREO CALCS-RIBBON MARKER-PATH PENDING   BREAST BIOPSY Right 03/09/2024   MM RT BREAST BX W LOC DEV 1ST LESION IMAGE BX SPEC STEREO GUIDE 03/09/2024 [facility]   BREAST CYST ASPIRATION Left 90s   BREAST SURGERY  2007   aspiration   CARPAL TUNNEL RELEASE  2010   REFRACTIVE SURGERY     TONSILLECTOMY      SOCIAL  HISTORY: Social History   Socioeconomic History   Marital status: Married    Spouse name: Not on file   Number of children: Not on file   Years of education: Not on file   Highest education level: Associate degree: occupational, scientist, product/process development, or vocational program  Occupational History   Not on file  Tobacco Use   Smoking status: Never   Smokeless tobacco: Never  Vaping Use   Vaping status: Never Used  Substance and Sexual Activity   Alcohol use: No   Drug use: No   Sexual activity: Yes  Other Topics Concern   Not on file  Social History Narrative   married   Social Drivers of Health   Tobacco Use: Low Risk (05/07/2024)   Patient History    Smoking Tobacco Use: Never    Smokeless Tobacco Use: Never    Passive Exposure: Not on file  Financial Resource Strain: Low Risk (02/07/2024)   Overall Financial Resource Strain (CARDIA)    Difficulty of Paying Living Expenses: Not hard at all  Food Insecurity: No Food Insecurity (02/07/2024)   Epic    Worried About Programme Researcher, Broadcasting/film/video in the Last Year: Never true    [employer] of Food in the Last Year: Never true  Transportation Needs: No Transportation Needs (02/07/2024)   Epic    Lack of Transportation (Medical): No    Lack of Transportation (Non-Medical): No  Physical Activity: Inactive (02/07/2024)   Exercise Vital Sign    Days of Exercise per Week: 0 days    Minutes of Exercise per Session: Not on file  Stress: No Stress Concern Present (02/07/2024)   [employer] of Occupational Health - Occupational Stress Questionnaire  Feeling of Stress: Not at all  Social Connections: Moderately Integrated (02/07/2024)   Social Connection and Isolation Panel    Frequency of Communication with Friends and Family: Once a week    Frequency of Social Gatherings with Friends and Family: Once a week    Attends Religious Services: More than 4 times per year    Active Member of [employer] or Organizations: Yes    Attends Banker  Meetings: More than 4 times per year    Marital Status: Married  Catering Manager Violence: Not At Risk (09/25/2023)   Humiliation, Afraid, Rape, and Kick questionnaire    Fear of Current or Ex-Partner: No    Emotionally Abused: No    Physically Abused: No    Sexually Abused: No  Depression (PHQ2-9): Low Risk (02/10/2024)   Depression (PHQ2-9)    PHQ-2 Score: 2  Alcohol Screen: Low Risk (06/03/2023)   Alcohol Screen    Last Alcohol Screening Score (AUDIT): 0  Housing: Low Risk (02/07/2024)   Epic    Unable to Pay for Housing in the Last Year: No    Number of Times Moved in the Last Year: 0    Homeless in the Last Year: No  Utilities: Not At Risk (01/07/2024)   Received from [facility]   Epic    In the past 12 months has the electric, gas, oil, or water company threatened to shut off services in your home?: No  Health Literacy: Adequate Health Literacy (06/03/2023)   B1300 Health Literacy    Frequency of need for help with medical instructions: Never    FAMILY HISTORY: Family History  Problem Relation Age of Onset   Alzheimer's disease Mother    Depression Mother    Vision loss Mother    Lung cancer Father    Cancer Father    Prostate cancer Maternal Uncle    Prostate cancer Maternal Uncle    Prostate cancer Maternal Uncle    Prostate cancer Maternal Uncle    Prostate cancer Maternal Uncle    Prostate cancer Other        in mat great uncles, unk#   Prostate cancer Cousin        in 4-5 mat  first cousins   Breast cancer Neg Hx     ALLERGIES:  has no known allergies.  MEDICATIONS:  Current Outpatient Medications  Medication Sig Dispense Refill   citalopram  (CELEXA ) 40 MG tablet Take 1 tablet (40 mg total) by mouth daily. 30 tablet 3   lactulose  (CHRONULAC ) 10 GM/15ML solution take15 to 30 ml 3 times a day when needed for constipation 236 mL 0   losartan  (COZAAR ) 50 MG tablet Take 1 tablet (50 mg total) by mouth daily. 30 tablet 1   olaparib  (LYNPARZA )  100 MG tablet Take 2 tablets (200 mg total) by mouth 2 (two) times daily. May take with food to decrease nausea and vomiting. 120 tablet 1   oxyCODONE  (OXY IR/ROXICODONE ) 5 MG immediate release tablet Take 1 tablet (5 mg total) by mouth every 8 (eight) hours as needed for severe pain (pain score 7-10). 30 tablet 0   senna (SENOKOT) 8.6 MG TABS tablet Take 2 tablets (17.2 mg total) by mouth daily. 120 tablet 0   polyethylene glycol powder (GLYCOLAX /MIRALAX ) 17 GM/SCOOP powder Take by mouth once. (Patient not taking: Reported on 05/07/2024)     No current facility-administered medications for this visit.   Facility-Administered Medications Ordered in Other Visits  Medication Dose Route  Frequency Provider Last Rate Last Admin   heparin  lock flush 100 unit/mL  500 Units Intravenous Once Tjuana Vickrey, MD        Review of Systems  Constitutional:  Positive for fatigue. Negative for appetite change, chills, fever and unexpected weight change.  HENT:   Negative for hearing loss and voice change.   Eyes:  Negative for eye problems.  Respiratory:  Negative for chest tightness and cough.   Cardiovascular:  Negative for chest pain.  Gastrointestinal:  Positive for constipation. Negative for abdominal distention, abdominal pain, blood in stool, nausea and vomiting.  Endocrine: Negative for hot flashes.  Genitourinary:  Negative for difficulty urinating and frequency.   Musculoskeletal:  Negative for arthralgias.  Skin:  Negative for itching and rash.  Neurological:  Negative for extremity weakness.  Hematological:  Negative for adenopathy.  Psychiatric/Behavioral:  Positive for depression and sleep disturbance. Negative for confusion.    PHYSICAL EXAMINATION: ECOG PERFORMANCE STATUS: 1 - Symptomatic but completely ambulatory Vitals:   05/07/24 0856  BP: 118/71  Pulse: 78  Resp: 18  Temp: 98.8 F (37.1 C)    Filed Weights   05/07/24 0856  Weight: 166 lb 8 oz (75.5 kg)     Physical  Exam Constitutional:      General: She is not in acute distress. HENT:     Head: Normocephalic and atraumatic.  Eyes:     General: No scleral icterus. Cardiovascular:     Rate and Rhythm: Normal rate and regular rhythm.     Heart sounds: Normal heart sounds.  Pulmonary:     Effort: Pulmonary effort is normal. No respiratory distress.     Breath sounds: No wheezing.  Abdominal:     General: Bowel sounds are normal. There is no distension.     Palpations: Abdomen is soft.  Musculoskeletal:        General: No deformity. Normal range of motion.     Cervical back: Normal range of motion and neck supple.  Skin:    General: Skin is warm and dry.     Findings: No erythema or rash.  Neurological:     Mental Status: She is alert and oriented to person, place, and time. Mental status is at baseline.  Psychiatric:        Mood and Affect: Mood normal.     LABORATORY DATA:  I have reviewed the data as listed    Latest Ref Rng & Units 05/07/2024    8:32 AM 04/20/2024    8:51 AM 04/09/2024    8:29 AM  CBC  WBC 4.0 - 10.5 K/uL 4.3  4.7  5.4   Hemoglobin 12.0 - 15.0 g/dL 88.8  88.8  88.7   Hematocrit 36.0 - 46.0 % 33.7  33.5  34.6   Platelets 150 - 400 K/uL 161  166  163       Latest Ref Rng & Units 05/07/2024    8:32 AM 04/20/2024    8:51 AM 04/09/2024    8:29 AM  CMP  Glucose 70 - 99 mg/dL 857  877  888   BUN 8 - 23 mg/dL 33  33  27   Creatinine 0.44 - 1.00 mg/dL 8.40  8.21  8.77   Sodium 135 - 145 mmol/L 138  135  134   Potassium 3.5 - 5.1 mmol/L 4.7  4.2  4.4   Chloride 98 - 111 mmol/L 106  102  100   CO2 22 - 32 mmol/L 20  22  22   Calcium 8.9 - 10.3 mg/dL 9.8  9.6  9.5   Total Protein 6.5 - 8.1 g/dL 6.8  6.8  6.6   Total Bilirubin 0.0 - 1.2 mg/dL 0.2  0.4  0.2   Alkaline Phos 38 - 126 U/L 98  105  118   AST 15 - 41 U/L 22  21  29    ALT 0 - 44 U/L 14  17  30        RADIOGRAPHIC STUDIES: I have personally reviewed the radiological images as listed and agreed with the  findings in the report. MM RT BREAST BX W LOC DEV 1ST LESION IMAGE BX SPEC STEREO GUIDE Addendum Date: 03/11/2024 ADDENDUM REPORT: 03/11/2024 11:39 ADDENDUM: PATHOLOGY revealed: Breast, right, needle core biopsy, calcifications, 3:00 posterior depth, 3 mm (ribbon clip)- FIBROCYSTIC CHANGE WITH FIBROADENOMATOID CHANGE AND DYSTROPHIC CALCIFICATIONS- NEGATIVE FOR CARICNOMA Pathology results are CONCORDANT with imaging findings, per Dr. Norleen Croak. Pathology results and recommendations were discussed with patient via telephone on 03/10/2024 by Mliss Molt RN. Patient reported biopsy site doing well with no adverse symptoms, and only slight tenderness at the site. Post biopsy care instructions were reviewed, questions were answered and my direct phone number was provided. Patient was instructed to call [facility] for any additional questions or concerns related to biopsy site. RECOMMENDATION: Patient instructed to resume annual bilateral screening mammogram due October 2026. Pathology results reported by Mliss Molt RN 03/10/2024. Electronically Signed   By: Norleen Croak M.D.   On: 03/11/2024 11:39   Result Date: 03/11/2024 CLINICAL DATA:  Indeterminate RIGHT breast calcifications EXAM: RIGHT BREAST STEREOTACTIC CORE NEEDLE BIOPSY COMPARISON:  Previous exam(s). FINDINGS: The patient and I discussed the procedure of stereotactic-guided biopsy including benefits and alternatives. We discussed the high likelihood of a successful procedure. We discussed the risks of the procedure including infection, bleeding, tissue injury, clip migration, and inadequate sampling. Informed written consent was given. The usual time out protocol was performed immediately prior to the procedure. Using sterile technique and 1% lidocaine  and 1% lidocaine  with epinephrine  as local anesthetic, under stereotactic guidance, a 9 gauge vacuum assisted device was used to perform core needle biopsy of calcifications in the RIGHT  breast at 3 o'clock using a MEDIAL approach. Specimen radiograph was performed showing representative calcifications. Specimens with calcifications are identified for pathology. Lesion quadrant: Upper inner At the conclusion of the procedure, ribbon shaped tissue marker clip was deployed into the biopsy cavity. Follow-up 2-view mammogram was performed and dictated separately. IMPRESSION: Stereotactic-guided biopsy of RIGHT breast calcifications at 3 o'clock posterior depth. No apparent complications. Electronically Signed: By: Norleen Croak M.D. On: 03/09/2024 09:03   MM CLIP PLACEMENT RIGHT Result Date: 03/09/2024 CLINICAL DATA:  Status post RIGHT breast stereotactic biopsy EXAM: 3D DIAGNOSTIC RIGHT MAMMOGRAM POST STEREOTACTIC BIOPSY COMPARISON:  Previous exam(s). ACR Breast Density Category c: The breasts are heterogeneously dense, which may obscure small masses. FINDINGS: 3D Mammographic images were obtained following stereotactic guided biopsy of RIGHT breast calcifications at 3 o'clock. The biopsy marking clip is in expected position at the site of biopsy. IMPRESSION: Appropriate positioning of the ribbon shaped biopsy marking clip at the site of biopsy in the RIGHT breast. Final Assessment: Post Procedure Mammograms for Marker Placement Electronically Signed   By: Norleen Croak M.D.   On: 03/09/2024 09:10   DG Abdomen 1 View Result Date: 03/02/2024 EXAM: 1 VIEW XRAY OF THE ABDOMEN 03/02/2024 08:53:00 PM COMPARISON: CT 12/02/2023. CLINICAL HISTORY: Constipation. FINDINGS: BOWEL: Large stool  burden throughout the colon. Nonobstructive bowel gas pattern. SOFT TISSUES: No opaque urinary calculi. BONES: No acute osseous abnormality. IMPRESSION: 1. Large stool burden throughout the colon. Electronically signed by: Franky Crease MD 03/02/2024 09:08 PM EST RP Workstation: [id number]   MM 3D DIAGNOSTIC MAMMOGRAM UNILATERAL RIGHT BREAST Result Date: 02/28/2024 CLINICAL DATA:  Screening recall RIGHT breast  calcifications EXAM: DIGITAL DIAGNOSTIC UNILATERAL RIGHT MAMMOGRAM WITH TOMOSYNTHESIS AND CAD TECHNIQUE: Right digital diagnostic mammography and breast tomosynthesis was performed. The images were evaluated with computer-aided detection. COMPARISON:  Previous exam(s). ACR Breast Density Category c: The breasts are heterogeneously dense, which may obscure small masses. FINDINGS: Full field lateral and spot magnification view(s) were performed and demonstrate grouped coarse heterogeneous calcifications at the RIGHT breast 3 o'clock position posterior depth measuring 3 mm in diameter. No other suspicious findings in the RIGHT breast. IMPRESSION: Indeterminate RIGHT breast calcifications spanning 3 mm in the RIGHT breast 3 o'clock position posterior depth. Stereotactic biopsy is recommended. RECOMMENDATION: Stereotactic biopsy of the RIGHT breast x1 I have discussed the findings and recommendations with the patient. The biopsy procedure was discussed with the patient and questions were answered. Patient expressed their understanding of the biopsy recommendation. Patient will be scheduled for biopsy at her earliest convenience by the schedulers. Ordering provider will be notified. If applicable, a reminder letter will be sent to the patient regarding the next appointment. BI-RADS CATEGORY  4: Suspicious. Electronically Signed   By: Norleen Croak M.D.   On: 02/28/2024 11:02   MM 3D SCREENING MAMMOGRAM BILATERAL BREAST Result Date: 02/26/2024 CLINICAL DATA:  Screening. EXAM: DIGITAL SCREENING BILATERAL MAMMOGRAM WITH TOMOSYNTHESIS AND CAD TECHNIQUE: Bilateral screening digital craniocaudal and mediolateral oblique mammograms were obtained. Bilateral screening digital breast tomosynthesis was performed. The images were evaluated with computer-aided detection. COMPARISON:  Previous exam(s). ACR Breast Density Category c: The breasts are heterogeneously dense, which may obscure small masses. FINDINGS: In the right breast,  calcifications about the slightly upper slightly inner breast, middle to posterior third, warrant further evaluation with magnified views. In the left breast, no findings suspicious for malignancy. IMPRESSION: Further evaluation is suggested for calcifications in the right breast. RECOMMENDATION: Diagnostic mammogram of the right breast. (Code:[id number]) The patient will be contacted regarding the findings, and additional imaging will be scheduled. BI-RADS CATEGORY  0: Incomplete: Need additional imaging evaluation. Electronically Signed   By: Curtistine Noble   On: 02/26/2024 14:34         "

## 2024-05-07 NOTE — Progress Notes (Signed)
 Pt complains of joint pain and aches to shoulder, back and knee since starting with chemo, pt has Oxycodone  at home from her surgery but has not taken any for the current pain . Pt also reports metal taste in mouth

## 2024-05-07 NOTE — Assessment & Plan Note (Addendum)
 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  Elevated CA 125 609, elevated CA 27.29 -415  -Mychoice HRD positive  S/p neoadjuvant carboplatin /Taxol  x 4 [Bevacizumab  was not given in neoadjuvant setting due to tumor abutting bowel wall]  S/p  debulking surgery.  S/p additional 3 cycles of chemotherapy. With Bevacizumab  added for cycle 6. Bevacizumab  was held for cycle 7 due to proteinuria Labs are reviewed and discussed with patient. Continue Olarparib 200mg  BID. Hold Bevacizumab  due to proteinuria.

## 2024-05-07 NOTE — Progress Notes (Signed)
 Urine protein elevated. No treatment today per Dr. Babara. Patient will make appointment to see Dr. Francina for proteinuria. Keep follow up appointment here as scheduled. Patient verbalized understanding.

## 2024-05-07 NOTE — Assessment & Plan Note (Signed)
 Grade 1/2  Monitor.

## 2024-05-07 NOTE — Assessment & Plan Note (Signed)
 Exacerbated by Bevacizumab .  Previously discussed with Dr. Dennise and she has started Losartan  50mg  daily Recommend pt to follow up with nephrology

## 2024-05-08 LAB — CA 125: Cancer Antigen (CA) 125: 27.6 U/mL (ref 0.0–38.1)

## 2024-05-12 ENCOUNTER — Other Ambulatory Visit: Payer: Self-pay

## 2024-05-12 MED ORDER — CLONIDINE HCL 0.1 MG PO TABS
0.1000 mg | ORAL_TABLET | Freq: Two times a day (BID) | ORAL | 11 refills | Status: AC
  Filled 2024-05-12: qty 60, 30d supply, fill #0

## 2024-05-20 ENCOUNTER — Other Ambulatory Visit: Payer: Self-pay

## 2024-05-21 ENCOUNTER — Encounter: Payer: Self-pay | Admitting: Oncology

## 2024-05-21 ENCOUNTER — Other Ambulatory Visit (HOSPITAL_COMMUNITY): Payer: Self-pay

## 2024-05-22 ENCOUNTER — Other Ambulatory Visit: Payer: Self-pay

## 2024-05-22 NOTE — Progress Notes (Signed)
 Specialty Pharmacy Refill Coordination Note  Beth Hart is a 73 y.o. female contacted today regarding refills of specialty medication(s) Olaparib  (LYNPARZA )   Patient requested (Patient-Rptd) Delivery   Delivery date: 05/28/24   Verified address: (Patient-Rptd) [address] [place]   Medication will be filled on: 05/27/24

## 2024-05-27 ENCOUNTER — Other Ambulatory Visit: Payer: Self-pay

## 2024-05-27 ENCOUNTER — Other Ambulatory Visit (HOSPITAL_COMMUNITY): Payer: Self-pay

## 2024-05-28 ENCOUNTER — Other Ambulatory Visit (HOSPITAL_COMMUNITY): Payer: Self-pay

## 2024-05-28 ENCOUNTER — Inpatient Hospital Stay

## 2024-05-28 ENCOUNTER — Encounter: Payer: Self-pay | Admitting: Oncology

## 2024-05-28 ENCOUNTER — Inpatient Hospital Stay: Admitting: Oncology

## 2024-05-28 VITALS — BP 129/67 | HR 78

## 2024-05-28 VITALS — BP 130/83 | HR 81 | Temp 97.9°F | Resp 18 | Wt 166.7 lb

## 2024-05-28 DIAGNOSIS — C563 Malignant neoplasm of bilateral ovaries: Secondary | ICD-10-CM

## 2024-05-28 DIAGNOSIS — I159 Secondary hypertension, unspecified: Secondary | ICD-10-CM | POA: Diagnosis not present

## 2024-05-28 DIAGNOSIS — G620 Drug-induced polyneuropathy: Secondary | ICD-10-CM | POA: Diagnosis not present

## 2024-05-28 DIAGNOSIS — C786 Secondary malignant neoplasm of retroperitoneum and peritoneum: Secondary | ICD-10-CM

## 2024-05-28 DIAGNOSIS — N1831 Chronic kidney disease, stage 3a: Secondary | ICD-10-CM | POA: Diagnosis not present

## 2024-05-28 DIAGNOSIS — T451X5A Adverse effect of antineoplastic and immunosuppressive drugs, initial encounter: Secondary | ICD-10-CM | POA: Diagnosis not present

## 2024-05-28 DIAGNOSIS — Z5111 Encounter for antineoplastic chemotherapy: Secondary | ICD-10-CM

## 2024-05-28 DIAGNOSIS — I10 Essential (primary) hypertension: Secondary | ICD-10-CM | POA: Insufficient documentation

## 2024-05-28 DIAGNOSIS — Z5112 Encounter for antineoplastic immunotherapy: Secondary | ICD-10-CM | POA: Diagnosis not present

## 2024-05-28 DIAGNOSIS — C569 Malignant neoplasm of unspecified ovary: Secondary | ICD-10-CM

## 2024-05-28 DIAGNOSIS — R809 Proteinuria, unspecified: Secondary | ICD-10-CM

## 2024-05-28 LAB — CBC WITH DIFFERENTIAL (CANCER CENTER ONLY)
Abs Immature Granulocytes: 0.02 10*3/uL (ref 0.00–0.07)
Basophils Absolute: 0.1 10*3/uL (ref 0.0–0.1)
Basophils Relative: 1 %
Eosinophils Absolute: 0.2 10*3/uL (ref 0.0–0.5)
Eosinophils Relative: 3 %
HCT: 36 % (ref 36.0–46.0)
Hemoglobin: 11.9 g/dL — ABNORMAL LOW (ref 12.0–15.0)
Immature Granulocytes: 0 %
Lymphocytes Relative: 35 %
Lymphs Abs: 2.3 10*3/uL (ref 0.7–4.0)
MCH: 33.5 pg (ref 26.0–34.0)
MCHC: 33.1 g/dL (ref 30.0–36.0)
MCV: 101.4 fL — ABNORMAL HIGH (ref 80.0–100.0)
Monocytes Absolute: 0.7 10*3/uL (ref 0.1–1.0)
Monocytes Relative: 10 %
Neutro Abs: 3.5 10*3/uL (ref 1.7–7.7)
Neutrophils Relative %: 51 %
Platelet Count: 172 10*3/uL (ref 150–400)
RBC: 3.55 MIL/uL — ABNORMAL LOW (ref 3.87–5.11)
RDW: 17.2 % — ABNORMAL HIGH (ref 11.5–15.5)
WBC Count: 6.7 10*3/uL (ref 4.0–10.5)
nRBC: 0 % (ref 0.0–0.2)

## 2024-05-28 LAB — CMP (CANCER CENTER ONLY)
ALT: 11 U/L (ref 0–44)
AST: 21 U/L (ref 15–41)
Albumin: 4.3 g/dL (ref 3.5–5.0)
Alkaline Phosphatase: 85 U/L (ref 38–126)
Anion gap: 12 (ref 5–15)
BUN: 25 mg/dL — ABNORMAL HIGH (ref 8–23)
CO2: 22 mmol/L (ref 22–32)
Calcium: 9.8 mg/dL (ref 8.9–10.3)
Chloride: 101 mmol/L (ref 98–111)
Creatinine: 1.4 mg/dL — ABNORMAL HIGH (ref 0.44–1.00)
GFR, Estimated: 40 mL/min — ABNORMAL LOW
Glucose, Bld: 106 mg/dL — ABNORMAL HIGH (ref 70–99)
Potassium: 4.2 mmol/L (ref 3.5–5.1)
Sodium: 135 mmol/L (ref 135–145)
Total Bilirubin: 0.4 mg/dL (ref 0.0–1.2)
Total Protein: 7 g/dL (ref 6.5–8.1)

## 2024-05-28 LAB — TOTAL PROTEIN, URINE DIPSTICK: Protein, ur: 30 mg/dL — AB

## 2024-05-28 MED ORDER — SODIUM CHLORIDE 0.9 % IV SOLN
INTRAVENOUS | Status: DC
  Filled 2024-05-28: qty 250

## 2024-05-28 MED ORDER — SODIUM CHLORIDE 0.9 % IV SOLN
15.0000 mg/kg | Freq: Once | INTRAVENOUS | Status: AC
  Administered 2024-05-28: 1100 mg via INTRAVENOUS
  Filled 2024-05-28: qty 32

## 2024-05-28 NOTE — Progress Notes (Signed)
 " Hematology/Oncology Progress note [phone] [phone]    CHIEF COMPLAINTS/REASON FOR VISIT:  High-grade serous carcinoma of ovaries. peritoneal carcinomatosis, liver lesion.   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  Elevated CA 125 609, elevated CA 27.29 -415  -Mychoice HRD positive  S/p neoadjuvant carboplatin /Taxol  x 4 [Bevacizumab  was not given in neoadjuvant setting due to tumor abutting bowel wall]  S/p  debulking surgery.  S/p additional 3 cycles of chemotherapy. With Bevacizumab  added for cycle 6. Bevacizumab  was held for cycle 7 due to proteinuria Labs are reviewed and discussed with patient. Continue Olarparib 200mg  BID.  Proceed with bevacizumab      Chemotherapy-induced neuropathy Grade 1/2  Monitor.   CKD (chronic kidney disease) stage 3, GFR 30-59 ml/min (HCC) Encouraged oral hydration, avoid nephrotoxins.   Encounter for antineoplastic chemotherapy Chemotherapy treatments as listed above.   Proteinuria Exacerbated by Bevacizumab .  Continue losartan  25 mg twice daily.  Follow-up with nephrologist.  Recent recommendation was reviewed.  Hypertension Continue losartan  25 mg twice daily.  She uses clonidine  as needed.  Encourage patient to continue monitor blood pressure at home.  Orders Placed This Encounter  Procedures   Total Protein, Urine dipstick    Standing Status:   Future    Expected Date:   07/10/2024    Expiration Date:   07/10/2025   CMP (Cancer Center only)    Standing Status:   Future    Expected Date:   07/10/2024    Expiration Date:   07/10/2025   CA 125    Standing Status:    Future    Expected Date:   07/10/2024    Expiration Date:   07/10/2025   CBC with Differential (Cancer Center Only)    Standing Status:   Future    Expected Date:   07/10/2024    Expiration Date:   07/10/2025   Follow-up 3 weeks.   All questions were answered. The patient knows to call the clinic with any problems, questions or concerns.  Call Babara, MD, PhD [facility] Hematology Oncology 05/28/2024   HISTORY OF PRESENTING ILLNESS:   Beth Hart is a  74 y.o.  female with PMH listed below presents for follow up of stage IV ovarian high grade serous carcinoma.  Oncology History  Peritoneal carcinomatosis (HCC)  09/18/2023 Initial Diagnosis   Peritoneal carcinomatosis (HCC)   10/10/2023 - 03/19/2024 Chemotherapy   Patient is on Treatment Plan : OVARIAN Carboplatin   + Paclitaxel  ) q21d      Genetic Testing   Negative genetic testing. No pathogenic variants identified on the [facility] CancerNext+RNA panel. The report date is 10/20/2023.  The [facility] CancerNext+RNAinsight Panel includes sequencing, rearrangement analysis, and RNA analysis for the following 40 genes: APC, ATM, BAP1, BARD1, BMPR1A, BRCA1, BRCA2, BRIP1, CDH1, CDKN2A, CHEK2, FH, FLCN, MET, MLH1, MSH2, MSH6, MUTYH, NF1, NTHL1, PALB2, PMS2, PTEN, RAD51C, RAD51D, RPS20, SMAD4, STK11, TP53, TSC1, TSC2, and VHL (sequencing and deletion/duplication); AXIN2, HOXB13, MBD4, MSH3, POLD1 and POLE (sequencing only); EPCAM and GREM1 (deletion/duplication only).   04/09/2024 -  Chemotherapy   Patient is on Treatment Plan : Bevacizumab  q21d     Ovarian cancer (HCC)  09/25/2023 Cancer  Staging   Staging form: Ovary, Fallopian Tube, and Primary Peritoneal Carcinoma, AJCC 8th Edition - Clinical stage from 09/25/2023: FIGO Stage IV (cT3c, cN1b, cM1) - Signed by Babara Call, MD on 10/05/2023 Stage prefix: Initial diagnosis   10/04/2023 Initial Diagnosis   Ovarian cancer  She experiences pressure and sharp pain in her abdomen, initially attributing it to  a pulled muscle from yard work. The pain persisted and was accompanied by a fever, prompting her to visit the ER on 09/16/2023. The fever was around 100F at home and remained at that level upon arrival at the ER. She has a persistent cough, especially when lying down.   09/16/2023, CT abdomen pelvis with contrast showed extensive omental and peritoneal carcinomatosis with small to moderate ascites. multiple hypoattenuating masses in the liver and spleen, compatible with metastases. There are also multiple mildly enlarged retroperitoneal lymph nodes, also highly concerning for metastases.  09/24/2023, cytology from peritoneal ascites showed negative for malignancy. 09/25/2023, endometrial biopsy showed no malignancy. 09/26/2023 Patient underwent peritoneal mass biopsy,  1. Peritoneum, biopsy, RUQ mass :       - METASTATIC HIGH-GRADE SEROUS CARCINOMA WITH NECROSIS, SEE NOTE   Diagnosis Note : Immunohistochemical stains show that the tumor cells are positive for PAX8, p16 and p53 (clonal overexpression pattern) while they are negative for p63 and CK5/6.  This immunoprofile is consistent with above interpretation.    Tumor Testing: [facility] shows HRD but no somatic BRCA mutation Genomic instability  score of 43.   10/10/2023 - 03/19/2024 Chemotherapy   Patient is on Treatment Plan : OVARIAN Carboplatin   + Paclitaxel  ) q21d      Imaging   CT chest w contrast  Several scattered sub 5 mm bilateral lung nodules identified. Based on appearance and other findings these are worrisome for early lung metastases. Few enlarged pre cardiophrenic mediastinal nodes as well as left internal mammary chain lymph nodes also worrisome for spread of disease.Tiny left pleural effusion.   Please correlate with previous abdomen pelvis CT describing abdominal findings.      Genetic Testing   Negative genetic testing. No pathogenic variants identified on the [facility] CancerNext+RNA panel. The report date is  10/20/2023.  The [facility] CancerNext+RNAinsight Panel includes sequencing, rearrangement analysis, and RNA analysis for the following 40 genes: APC, ATM, BAP1, BARD1, BMPR1A, BRCA1, BRCA2, BRIP1, CDH1, CDKN2A, CHEK2, FH, FLCN, MET, MLH1, MSH2, MSH6, MUTYH, NF1, NTHL1, PALB2, PMS2, PTEN, RAD51C, RAD51D, RPS20, SMAD4, STK11, TP53, TSC1, TSC2, and VHL (sequencing and deletion/duplication); AXIN2, HOXB13, MBD4, MSH3, POLD1 and POLE (sequencing only); EPCAM and GREM1 (deletion/duplication only).   01/07/2024 Surgery   Debulking surgery  Pathology showed  A. Omentum, omentectomy:   High-grade serous carcinoma (7.5 cm).     B. Left fallopian tube and ovary, left salpingo-oophorectomy:   High-grade serous carcinoma with surface involvement of the ovary. Benign fallopian tube.     C. Right fallopian tube and ovary, right salpingo-oophorectomy:   High-grade serous carcinoma with surface involvement of the ovary and fallopian tube.     D. Uterus and cervix, hysterectomy:   Uterus (39 grams): Endometrium: Inactive. Myometrium: Benign.  Cervix: Benign. Serosa: High-grade serous carcinoma.  TUMOR    Tumor Site:    Bilateral ovaries     Tumor Size:    Greatest Dimension (Centimeters): 1.7 cm     Histologic Type:    High-grade serous carcinoma     Ovarian Surface Involvement:    Present, right and left     Fallopian Tube  Surface Involvement:    Present, right     Other Tissue / Organ Involvement:    Right ovary     Other Tissue / Organ Involvement:    Left ovary     Other Tissue / Organ Involvement:    Right fallopian tube     Other Tissue / Organ Involvement:    Omentum     Other Tissue / Organ Involvement:    uterine serosa     Largest Extrapelvic Peritoneal Focus:    Macroscopic (greater than 2 cm): Omentum     Peritoneal / Ascitic Fluid Involvement:    Not submitted / unknown     Chemotherapy Response Score (CRS):    CRS1 (no definite or minimal response)   REGIONAL LYMPH NODES      Regional Lymph Node Status:    Not applicable (no regional lymph nodes submitted or found)   pTNM CLASSIFICATION (AJCC 8th Edition)     Reporting of pT, pN, and (when applicable) pM categories is based on information available to the pathologist at the time the report is issued. As per the AJCC (Chapter 1, 8th Ed.) it is the managing physician's responsibility to establish the final pathologic stage based upon all pertinent information, including but potentially not limited to this pathology report.     Modified Classification:    y     pT Category:    pT3c     pN Category:    pN not assigned (no nodes submitted or found)      04/09/2024 -  Chemotherapy   Patient is on Treatment Plan : Bevacizumab  q21d      Her family history includes father who had lung cancer attributed to smoking, but no history of colon, ovarian, or breast cancer. Discussed the use of AI scribe software for clinical note transcription with the patient, who gave verbal consent to proceed.    She underwent a colonoscopy a few years ago, which was reported to be negative.  And a mammogram six months ago, which was negative she does not recall any recent stomach pain, dysphagia, or acid reflux.  INTERVAL HISTORY MARLOW HENDRIE is a 74 y.o. female who has above history reviewed by me today presents for follow up visit for Stage IV ovarian cancer.   Discussed the use of AI scribe software for clinical note transcription with the patient, who gave verbal consent to proceed.  Patient was seen by nephrology.  Currently she is on losartan  25 mg twice daily.  She takes clonidine  as needed.  She monitors her blood pressure at home and keep a log. Patient takes a lot..  Tolerates well.  No new complaints.   MEDICAL HISTORY:  Past Medical History:  Diagnosis Date   Anemia    Arthritis    Asthma    as a child   Cataract 2019   Chicken pox    Chronic kidney disease 2015   Colon polyps 2012   Depression    Hemorrhoids     Ovarian cancer [facility])     SURGICAL HISTORY: Past Surgical History:  Procedure Laterality Date   ABDOMINAL HYSTERECTOMY     BREAST BIOPSY  03/09/2024   RIGHT BREAST STEREO CALCS-RIBBON MARKER-PATH PENDING   BREAST BIOPSY Right 03/09/2024   MM RT BREAST BX W LOC DEV 1ST LESION IMAGE BX SPEC STEREO GUIDE 03/09/2024 [facility]   BREAST CYST ASPIRATION Left 90s   BREAST SURGERY  2007   aspiration   CARPAL TUNNEL RELEASE  2010   REFRACTIVE SURGERY     TONSILLECTOMY      SOCIAL HISTORY: Social History   Socioeconomic History   Marital status: Married    Spouse name: Not on file   Number of children: Not on file   Years of education: Not on file   Highest education level: Associate degree: occupational, scientist, product/process development, or vocational program  Occupational History   Not on file  Tobacco Use   Smoking status: Never   Smokeless tobacco: Never  Vaping Use   Vaping status: Never Used  Substance and Sexual Activity   Alcohol use: No   Drug use: No   Sexual activity: Yes  Other Topics Concern   Not on file  Social History Narrative   married   Social Drivers of Health   Tobacco Use: Low Risk (05/28/2024)   Patient History    Smoking Tobacco Use: Never    Smokeless Tobacco Use: Never    Passive Exposure: Not on file  Financial Resource Strain: Low Risk (02/07/2024)   Overall Financial Resource Strain (CARDIA)    Difficulty of Paying Living Expenses: Not hard at all  Food Insecurity: No Food Insecurity (02/07/2024)   Epic    Worried About Programme Researcher, Broadcasting/film/video in the Last Year: Never true    Ran Out of Food in the Last Year: Never true  Transportation Needs: No Transportation Needs (02/07/2024)   Epic    Lack of Transportation (Medical): No    Lack of Transportation (Non-Medical): No  Physical Activity: Inactive (02/07/2024)   Exercise Vital Sign    Days of Exercise per Week: 0 days    Minutes of Exercise per Session: Not on file  Stress: No Stress Concern Present  (02/07/2024)   [employer] of Occupational Health - Occupational Stress Questionnaire    Feeling of Stress: Not at all  Social Connections: Moderately Integrated (02/07/2024)   Social Connection and Isolation Panel    Frequency of Communication with Friends and Family: Once a week    Frequency of Social Gatherings with Friends and Family: Once a week    Attends Religious Services: More than 4 times per year    Active Member of [employer] or Organizations: Yes    Attends Banker Meetings: More than 4 times per year    Marital Status: Married  Catering Manager Violence: Not At Risk (09/25/2023)   Humiliation, Afraid, Rape, and Kick questionnaire    Fear of Current or Ex-Partner: No    Emotionally Abused: No    Physically Abused: No    Sexually Abused: No  Depression (PHQ2-9): Low Risk (02/10/2024)   Depression (PHQ2-9)    PHQ-2 Score: 2  Alcohol Screen: Low Risk (06/03/2023)   Alcohol Screen    Last Alcohol Screening Score (AUDIT): 0  Housing: Low Risk (02/07/2024)   Epic    Unable to Pay for Housing in the Last Year: No    Number of Times Moved in the Last Year: 0    Homeless in the Last Year: No  Utilities: Not At Risk (01/07/2024)   Received from [facility]   Epic    In the past 12 months has the electric, gas, oil, or water company threatened to shut off services in your home?: No  Health Literacy: Adequate Health Literacy (06/03/2023)   B1300 Health Literacy    Frequency of need for help with medical instructions: Never    FAMILY HISTORY: Family History  Problem Relation Age of Onset  Alzheimer's disease Mother    Depression Mother    Vision loss Mother    Lung cancer Father    Cancer Father    Prostate cancer Maternal Uncle    Prostate cancer Maternal Uncle    Prostate cancer Maternal Uncle    Prostate cancer Maternal Uncle    Prostate cancer Maternal Uncle    Prostate cancer Other        in mat great uncles, unk#   Prostate cancer  Cousin        in 4-5 mat  first cousins   Breast cancer Neg Hx     ALLERGIES:  has no known allergies.  MEDICATIONS:  Current Outpatient Medications  Medication Sig Dispense Refill   citalopram  (CELEXA ) 40 MG tablet Take 1 tablet (40 mg total) by mouth daily. 30 tablet 3   cloNIDine  (CATAPRES ) 0.1 MG tablet Take 1 tablet (0.1 mg total) by mouth in the morning and evening as needed for systolic (TOP number) above 160. 60 tablet 11   lactulose  (CHRONULAC ) 10 GM/15ML solution take15 to 30 ml 3 times a day when needed for constipation 236 mL 0   losartan  (COZAAR ) 50 MG tablet Take 1 tablet (50 mg total) by mouth daily. 30 tablet 1   olaparib  (LYNPARZA ) 100 MG tablet Take 2 tablets (200 mg total) by mouth 2 (two) times daily. May take with food to decrease nausea and vomiting. 120 tablet 1   oxyCODONE  (OXY IR/ROXICODONE ) 5 MG immediate release tablet Take 1 tablet (5 mg total) by mouth every 8 (eight) hours as needed for severe pain (pain score 7-10). 30 tablet 0   senna (SENOKOT) 8.6 MG TABS tablet Take 2 tablets (17.2 mg total) by mouth daily. 120 tablet 0   polyethylene glycol powder (GLYCOLAX /MIRALAX ) 17 GM/SCOOP powder Take by mouth once. (Patient not taking: Reported on 05/28/2024)     No current facility-administered medications for this visit.   Facility-Administered Medications Ordered in Other Visits  Medication Dose Route Frequency Provider Last Rate Last Admin   0.9 %  sodium chloride  infusion   Intravenous Continuous Babara Call, MD   Stopped at 05/28/24 1108   heparin  lock flush 100 unit/mL  500 Units Intravenous Once Finnley Larusso, MD        Review of Systems  Constitutional:  Positive for fatigue. Negative for appetite change, chills, fever and unexpected weight change.  HENT:   Negative for hearing loss and voice change.   Eyes:  Negative for eye problems.  Respiratory:  Negative for chest tightness and cough.   Cardiovascular:  Negative for chest pain.  Gastrointestinal:   Positive for constipation. Negative for abdominal distention, abdominal pain, blood in stool, nausea and vomiting.  Endocrine: Negative for hot flashes.  Genitourinary:  Negative for difficulty urinating and frequency.   Musculoskeletal:  Negative for arthralgias.  Skin:  Negative for itching and rash.  Neurological:  Negative for extremity weakness.  Hematological:  Negative for adenopathy.  Psychiatric/Behavioral:  Positive for depression and sleep disturbance. Negative for confusion.    PHYSICAL EXAMINATION: ECOG PERFORMANCE STATUS: 1 - Symptomatic but completely ambulatory Vitals:   05/28/24 0854  BP: 130/83  Pulse: 81  Resp: 18  Temp: 97.9 F (36.6 C)  SpO2: 98%    Filed Weights   05/28/24 0854  Weight: 166 lb 11.2 oz (75.6 kg)     Physical Exam Constitutional:      General: She is not in acute distress. HENT:     Head: Normocephalic and  atraumatic.  Eyes:     General: No scleral icterus. Cardiovascular:     Rate and Rhythm: Normal rate and regular rhythm.     Heart sounds: Normal heart sounds.  Pulmonary:     Effort: Pulmonary effort is normal. No respiratory distress.     Breath sounds: No wheezing.  Abdominal:     General: Bowel sounds are normal. There is no distension.     Palpations: Abdomen is soft.  Musculoskeletal:        General: No deformity. Normal range of motion.     Cervical back: Normal range of motion and neck supple.  Skin:    General: Skin is warm and dry.     Findings: No erythema or rash.  Neurological:     Mental Status: She is alert and oriented to person, place, and time. Mental status is at baseline.  Psychiatric:        Mood and Affect: Mood normal.     LABORATORY DATA:  I have reviewed the data as listed    Latest Ref Rng & Units 05/28/2024    8:26 AM 05/07/2024    8:32 AM 04/20/2024    8:51 AM  CBC  WBC 4.0 - 10.5 K/uL 6.7  4.3  4.7   Hemoglobin 12.0 - 15.0 g/dL 88.0  88.8  88.8   Hematocrit 36.0 - 46.0 % 36.0  33.7  33.5    Platelets 150 - 400 K/uL 172  161  166       Latest Ref Rng & Units 05/28/2024    8:27 AM 05/07/2024    8:32 AM 04/20/2024    8:51 AM  CMP  Glucose 70 - 99 mg/dL 893  857  877   BUN 8 - 23 mg/dL 25  33  33   Creatinine 0.44 - 1.00 mg/dL 8.59  8.40  8.21   Sodium 135 - 145 mmol/L 135  138  135   Potassium 3.5 - 5.1 mmol/L 4.2  4.7  4.2   Chloride 98 - 111 mmol/L 101  106  102   CO2 22 - 32 mmol/L 22  20  22    Calcium 8.9 - 10.3 mg/dL 9.8  9.8  9.6   Total Protein 6.5 - 8.1 g/dL 7.0  6.8  6.8   Total Bilirubin 0.0 - 1.2 mg/dL 0.4  0.2  0.4   Alkaline Phos 38 - 126 U/L 85  98  105   AST 15 - 41 U/L 21  22  21    ALT 0 - 44 U/L 11  14  17        RADIOGRAPHIC STUDIES: I have personally reviewed the radiological images as listed and agreed with the findings in the report. MM RT BREAST BX W LOC DEV 1ST LESION IMAGE BX SPEC STEREO GUIDE Addendum Date: 03/11/2024 ADDENDUM REPORT: 03/11/2024 11:39 ADDENDUM: PATHOLOGY revealed: Breast, right, needle core biopsy, calcifications, 3:00 posterior depth, 3 mm (ribbon clip)- FIBROCYSTIC CHANGE WITH FIBROADENOMATOID CHANGE AND DYSTROPHIC CALCIFICATIONS- NEGATIVE FOR CARICNOMA Pathology results are CONCORDANT with imaging findings, per Dr. Norleen Croak. Pathology results and recommendations were discussed with patient via telephone on 03/10/2024 by Mliss Molt RN. Patient reported biopsy site doing well with no adverse symptoms, and only slight tenderness at the site. Post biopsy care instructions were reviewed, questions were answered and my direct phone number was provided. Patient was instructed to call [facility] for any additional questions or concerns related to biopsy site. RECOMMENDATION: Patient instructed to resume annual  bilateral screening mammogram due October 2026. Pathology results reported by Mliss Molt RN 03/10/2024. Electronically Signed   By: Norleen Croak M.D.   On: 03/11/2024 11:39   Result Date: 03/11/2024 CLINICAL DATA:   Indeterminate RIGHT breast calcifications EXAM: RIGHT BREAST STEREOTACTIC CORE NEEDLE BIOPSY COMPARISON:  Previous exam(s). FINDINGS: The patient and I discussed the procedure of stereotactic-guided biopsy including benefits and alternatives. We discussed the high likelihood of a successful procedure. We discussed the risks of the procedure including infection, bleeding, tissue injury, clip migration, and inadequate sampling. Informed written consent was given. The usual time out protocol was performed immediately prior to the procedure. Using sterile technique and 1% lidocaine  and 1% lidocaine  with epinephrine  as local anesthetic, under stereotactic guidance, a 9 gauge vacuum assisted device was used to perform core needle biopsy of calcifications in the RIGHT breast at 3 o'clock using a MEDIAL approach. Specimen radiograph was performed showing representative calcifications. Specimens with calcifications are identified for pathology. Lesion quadrant: Upper inner At the conclusion of the procedure, ribbon shaped tissue marker clip was deployed into the biopsy cavity. Follow-up 2-view mammogram was performed and dictated separately. IMPRESSION: Stereotactic-guided biopsy of RIGHT breast calcifications at 3 o'clock posterior depth. No apparent complications. Electronically Signed: By: Norleen Croak M.D. On: 03/09/2024 09:03   MM CLIP PLACEMENT RIGHT Result Date: 03/09/2024 CLINICAL DATA:  Status post RIGHT breast stereotactic biopsy EXAM: 3D DIAGNOSTIC RIGHT MAMMOGRAM POST STEREOTACTIC BIOPSY COMPARISON:  Previous exam(s). ACR Breast Density Category c: The breasts are heterogeneously dense, which may obscure small masses. FINDINGS: 3D Mammographic images were obtained following stereotactic guided biopsy of RIGHT breast calcifications at 3 o'clock. The biopsy marking clip is in expected position at the site of biopsy. IMPRESSION: Appropriate positioning of the ribbon shaped biopsy marking clip at the site of  biopsy in the RIGHT breast. Final Assessment: Post Procedure Mammograms for Marker Placement Electronically Signed   By: Norleen Croak M.D.   On: 03/09/2024 09:10   DG Abdomen 1 View Result Date: 03/02/2024 EXAM: 1 VIEW XRAY OF THE ABDOMEN 03/02/2024 08:53:00 PM COMPARISON: CT 12/02/2023. CLINICAL HISTORY: Constipation. FINDINGS: BOWEL: Large stool burden throughout the colon. Nonobstructive bowel gas pattern. SOFT TISSUES: No opaque urinary calculi. BONES: No acute osseous abnormality. IMPRESSION: 1. Large stool burden throughout the colon. Electronically signed by: Franky Crease MD 03/02/2024 09:08 PM EST RP Workstation: [id number]         "

## 2024-05-28 NOTE — Assessment & Plan Note (Signed)
 Chemotherapy treatments as listed above.

## 2024-05-28 NOTE — Assessment & Plan Note (Signed)
 Grade 1/2  Monitor.

## 2024-05-28 NOTE — Assessment & Plan Note (Signed)
 Encouraged oral hydration, avoid nephrotoxins.

## 2024-05-28 NOTE — Assessment & Plan Note (Signed)
 Exacerbated by Bevacizumab .  Continue losartan  25 mg twice daily.  Follow-up with nephrologist.  Recent recommendation was reviewed.

## 2024-05-28 NOTE — Assessment & Plan Note (Addendum)
 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  Elevated CA 125 609, elevated CA 27.29 -415  -Mychoice HRD positive  S/p neoadjuvant carboplatin /Taxol  x 4 [Bevacizumab  was not given in neoadjuvant setting due to tumor abutting bowel wall]  S/p  debulking surgery.  S/p additional 3 cycles of chemotherapy. With Bevacizumab  added for cycle 6. Bevacizumab  was held for cycle 7 due to proteinuria Labs are reviewed and discussed with patient. Continue Olarparib 200mg  BID.  Proceed with bevacizumab 

## 2024-05-28 NOTE — Patient Instructions (Signed)

## 2024-05-28 NOTE — Assessment & Plan Note (Signed)
 Continue losartan  25 mg twice daily.  She uses clonidine  as needed.  Encourage patient to continue monitor blood pressure at home.

## 2024-05-29 ENCOUNTER — Other Ambulatory Visit: Payer: Self-pay

## 2024-05-29 LAB — CA 125: Cancer Antigen (CA) 125: 31.2 U/mL (ref 0.0–38.1)

## 2024-06-01 ENCOUNTER — Other Ambulatory Visit: Payer: Self-pay | Admitting: Hospice and Palliative Medicine

## 2024-06-01 ENCOUNTER — Other Ambulatory Visit: Payer: Self-pay | Admitting: Oncology

## 2024-06-02 ENCOUNTER — Other Ambulatory Visit: Payer: Self-pay | Admitting: Oncology

## 2024-06-02 ENCOUNTER — Other Ambulatory Visit: Payer: Self-pay

## 2024-06-02 ENCOUNTER — Encounter: Payer: Self-pay | Admitting: Oncology

## 2024-06-02 MED ORDER — CITALOPRAM HYDROBROMIDE 40 MG PO TABS
40.0000 mg | ORAL_TABLET | Freq: Every day | ORAL | 3 refills | Status: AC
  Filled 2024-06-02: qty 30, 30d supply, fill #0

## 2024-06-03 ENCOUNTER — Other Ambulatory Visit: Payer: Self-pay

## 2024-06-03 ENCOUNTER — Encounter: Payer: Self-pay | Admitting: Oncology

## 2024-06-04 ENCOUNTER — Other Ambulatory Visit: Payer: Self-pay

## 2024-06-04 MED ORDER — LOSARTAN POTASSIUM 50 MG PO TABS
25.0000 mg | ORAL_TABLET | Freq: Two times a day (BID) | ORAL | 3 refills | Status: AC
  Filled 2024-06-04: qty 90, 90d supply, fill #0

## 2024-06-10 ENCOUNTER — Ambulatory Visit: Payer: Medicare HMO

## 2024-06-18 ENCOUNTER — Inpatient Hospital Stay

## 2024-06-18 ENCOUNTER — Inpatient Hospital Stay: Admitting: Oncology

## 2024-06-19 ENCOUNTER — Inpatient Hospital Stay

## 2024-06-19 ENCOUNTER — Inpatient Hospital Stay: Admitting: Oncology

## 2024-07-10 ENCOUNTER — Inpatient Hospital Stay

## 2024-07-10 ENCOUNTER — Inpatient Hospital Stay: Admitting: Oncology

## 2024-07-15 ENCOUNTER — Inpatient Hospital Stay
